# Patient Record
Sex: Male | Born: 1935 | Race: Black or African American | Hispanic: No | Smoking: Never smoker
Health system: Southern US, Community
[De-identification: ages and names within clinical notes are randomized; demographics above are authoritative.]

## PROBLEM LIST (undated history)

## (undated) DIAGNOSIS — C801 Malignant (primary) neoplasm, unspecified: Secondary | ICD-10-CM

## (undated) DIAGNOSIS — G20A1 Parkinson's disease without dyskinesia, without mention of fluctuations: Secondary | ICD-10-CM

## (undated) DIAGNOSIS — T8859XA Other complications of anesthesia, initial encounter: Secondary | ICD-10-CM

## (undated) DIAGNOSIS — I10 Essential (primary) hypertension: Secondary | ICD-10-CM

## (undated) DIAGNOSIS — E785 Hyperlipidemia, unspecified: Secondary | ICD-10-CM

## (undated) DIAGNOSIS — F0390 Unspecified dementia without behavioral disturbance: Secondary | ICD-10-CM

## (undated) DIAGNOSIS — T4145XA Adverse effect of unspecified anesthetic, initial encounter: Secondary | ICD-10-CM

## (undated) DIAGNOSIS — M1990 Unspecified osteoarthritis, unspecified site: Secondary | ICD-10-CM

## (undated) HISTORY — PX: EYE SURGERY: SHX253

## (undated) HISTORY — PX: JOINT REPLACEMENT: SHX530

## (undated) SURGERY — REPAIR, HERNIA, INGUINAL, LAPAROSCOPIC
Anesthesia: General | Laterality: Left

---

## 1990-05-12 HISTORY — PX: OTHER SURGICAL HISTORY: SHX169

## 1990-05-12 HISTORY — PX: HERNIA REPAIR: SHX51

## 2006-09-29 ENCOUNTER — Ambulatory Visit: Payer: Self-pay | Admitting: Internal Medicine

## 2009-02-28 ENCOUNTER — Ambulatory Visit: Payer: Self-pay | Admitting: Ophthalmology

## 2009-08-01 ENCOUNTER — Telehealth: Payer: Self-pay | Admitting: Cardiovascular Disease

## 2009-10-09 ENCOUNTER — Encounter: Admission: RE | Admit: 2009-10-09 | Discharge: 2009-10-09 | Payer: Self-pay | Admitting: Orthopedic Surgery

## 2010-06-11 NOTE — Progress Notes (Signed)
Summary: Refill  Phone Note From Pharmacy   Caller: kmart Call For: Dr. Mariah Milling  Summary of Call: Pharmacy called and patient needs a refill on his Zocor, former [facility] patient. [facility] Initial call taken by: [place],  August 01, 2009 4:07 PM    New/Updated Medications: SIMVASTATIN 20 MG TABS (SIMVASTATIN) Take one tablet by mouth daily at bedtime

## 2011-06-10 ENCOUNTER — Ambulatory Visit: Payer: Self-pay | Admitting: General Surgery

## 2011-08-26 ENCOUNTER — Ambulatory Visit: Payer: Self-pay | Admitting: Anesthesiology

## 2011-09-12 ENCOUNTER — Ambulatory Visit: Payer: Self-pay | Admitting: Anesthesiology

## 2011-10-15 ENCOUNTER — Ambulatory Visit: Payer: Self-pay | Admitting: Anesthesiology

## 2014-01-01 DIAGNOSIS — N401 Enlarged prostate with lower urinary tract symptoms: Secondary | ICD-10-CM

## 2014-01-01 DIAGNOSIS — N138 Other obstructive and reflux uropathy: Secondary | ICD-10-CM | POA: Insufficient documentation

## 2014-01-01 DIAGNOSIS — M1990 Unspecified osteoarthritis, unspecified site: Secondary | ICD-10-CM | POA: Insufficient documentation

## 2014-01-01 DIAGNOSIS — I10 Essential (primary) hypertension: Secondary | ICD-10-CM | POA: Insufficient documentation

## 2014-01-01 DIAGNOSIS — M179 Osteoarthritis of knee, unspecified: Secondary | ICD-10-CM | POA: Insufficient documentation

## 2014-01-01 DIAGNOSIS — E7800 Pure hypercholesterolemia, unspecified: Secondary | ICD-10-CM | POA: Insufficient documentation

## 2015-01-23 DIAGNOSIS — Z0000 Encounter for general adult medical examination without abnormal findings: Secondary | ICD-10-CM | POA: Insufficient documentation

## 2015-10-16 DIAGNOSIS — R262 Difficulty in walking, not elsewhere classified: Secondary | ICD-10-CM | POA: Insufficient documentation

## 2015-10-16 DIAGNOSIS — M25562 Pain in left knee: Secondary | ICD-10-CM | POA: Insufficient documentation

## 2015-10-16 DIAGNOSIS — G3184 Mild cognitive impairment, so stated: Secondary | ICD-10-CM | POA: Insufficient documentation

## 2015-10-23 DIAGNOSIS — E538 Deficiency of other specified B group vitamins: Secondary | ICD-10-CM | POA: Insufficient documentation

## 2015-12-10 ENCOUNTER — Telehealth: Payer: Self-pay | Admitting: General Surgery

## 2015-12-10 NOTE — Telephone Encounter (Signed)
Left voice message for patient to schedule appointment for inguinal hernia. Referred by [facility]

## 2015-12-19 ENCOUNTER — Ambulatory Visit (INDEPENDENT_AMBULATORY_CARE_PROVIDER_SITE_OTHER): Payer: Medicare Other | Admitting: Surgery

## 2015-12-19 ENCOUNTER — Encounter: Payer: Self-pay | Admitting: Surgery

## 2015-12-19 VITALS — BP 129/73 | HR 50 | Temp 98.2°F | Ht 71.0 in | Wt 196.5 lb

## 2015-12-19 DIAGNOSIS — K4090 Unilateral inguinal hernia, without obstruction or gangrene, not specified as recurrent: Secondary | ICD-10-CM | POA: Diagnosis not present

## 2015-12-19 NOTE — Patient Instructions (Signed)
Please see Blue pre-care sheet for surgery information. Please call our office if you have questions or concerns.

## 2015-12-20 ENCOUNTER — Telehealth: Payer: Self-pay | Admitting: Surgery

## 2015-12-20 NOTE — Telephone Encounter (Signed)
Called patient to go over the surgery information below. No answer. I have left a message for patient to call back.   Pt advised of pre op date/time and sx date. Sx: 01/03/16 with Dr Pabon--Laparoscopic inguinal hernia repair with Mesh.  Pre op: 12/25/15 @ 9:00am--Office.   Patient made aware to call [phone], between 1-3:00pm the day before surgery, to find out what time to arrive.

## 2015-12-20 NOTE — Progress Notes (Signed)
Patient ID: Travis [name], male   DOB: 1931/12/22, 82 y.o.   MRN: [id number]  HPI Travis [name] is a 82 y.o. male seen in consultation for a left inguinal or hernia. Refer by Dr. Ouida Sills. He states that he has had a left inguinal hernia or several years and but over the last 6 months ago so this is starting to have intermittent left dull pain that is moderate and worsening with Valsalva maneuvers. The pain is non-referred and its alleviated time passes. He did have a history of a previous open inguinal hernia repair on the right side and he developed pain for about 2 years and that is why he was hesitant about having the left hernia site done. Now as I stated above his symptoms have worsened significantly. He is independent and he is able to walk without any shortness of breath or chest pain does not have any cardiac history that is significant. He is competent and his mind is very clear  HPI  History reviewed. No pertinent past medical history.  Past Surgical History:  Procedure Laterality Date  . eye implants  1992  . HERNIA REPAIR  1992    Family History  Problem Relation Age of Onset  . Stroke Father   . Diabetes Sister   . Breast cancer Sister     Social History Social History  Substance Use Topics  . Smoking status: Never Smoker  . Smokeless tobacco: Never Used  . Alcohol use No    No Known Allergies  Current Outpatient Prescriptions  Medication Sig Dispense Refill  . amLODipine (NORVASC) 5 MG tablet Take by mouth.    . pravastatin (PRAVACHOL) 40 MG tablet Take by mouth.    . sildenafil (REVATIO) 20 MG tablet Take by mouth.    Marland Kitchen aspirin EC 81 MG tablet Take by mouth.    . Cyanocobalamin (RA VITAMIN B-12 TR) 1000 MCG TBCR Take by mouth.     No current facility-administered medications for this visit.      Review of Systems A 10 point review of systems was asked and was negative except for the information on the HPI  Physical Exam Blood pressure 129/73, pulse  (!) 50, temperature 98.2 F (36.8 C), temperature source Oral, height 5\' 11"  (1.803 m), weight 89.1 kg (196 lb 8 oz). CONSTITUTIONAL: NAD , well developed EYES: Pupils are equal, round, and reactive to light, Sclera are non-icteric. EARS, NOSE, MOUTH AND THROAT: The oropharynx is clear. The oral mucosa is pink and moist. Hearing is intact to voice. LYMPH NODES:  Lymph nodes in the neck are normal. RESPIRATORY:  Lungs are clear. There is normal respiratory effort, with equal breath sounds bilaterally, and without pathologic use of accessory muscles. CARDIOVASCULAR: Heart is regular without murmurs, gallops, or rubs. GI: The abdomen is soft, nontender, and nondistended. There are no palpable masses. There is no hepatosplenomegaly. There are normal bowel sounds in all quadrants. There is a reducible Left inguinal hernia mildly tender to palpation GU: No evidence of testicular or penile lesions..   MUSCULOSKELETAL: Normal muscle strength and tone. No cyanosis or edema.   SKIN: Turgor is good and there are no pathologic skin lesions or ulcers. NEUROLOGIC: Motor and sensation is grossly normal. Cranial nerves are grossly intact. PSYCH:  Oriented to person, place and time. Affect is normal.  Data Reviewed CT and labs have personally been reviewed  and medical records.    Assessment/ Plan Symptomatic left inguinal hernia on a very functional octogenarian. Discussed  with him and the knees in detail about his disease process. He has significant symptoms that married repair of the inguinal hernia.I have explained the procedure, risks, and aftercare of inguinal hernia repair to [name].   Risks include but are not limited to bleeding, infection, wound problems, anesthesia, recurrence, bladder or intestine injury, urinary retention, testicular dysfunction, chronic pain, mesh problems.  He  seems to understand and agrees to proceed.  Questions were answered to his stated satisfaction. We'll plan for  a left laparoscopic inguinal hernia repair with mesh. We'll also arrange for preoperative visit with anesthesia.   Caroleen Hamman, MD FACS General Surgeon 12/20/2015, 9:41 AM

## 2015-12-21 NOTE — Telephone Encounter (Signed)
Patient was advised of surgery and Pre-Op date/time. Patient verbalized understanding and said he will call back if he has any further questions.

## 2015-12-25 ENCOUNTER — Encounter
Admission: RE | Admit: 2015-12-25 | Discharge: 2015-12-25 | Disposition: A | Discharge status: Home / Self Care | Payer: Medicare Other | Source: Ambulatory Visit | Attending: Surgery | Admitting: Surgery

## 2015-12-25 DIAGNOSIS — Z01810 Encounter for preprocedural cardiovascular examination: Secondary | ICD-10-CM | POA: Insufficient documentation

## 2015-12-25 DIAGNOSIS — Z01812 Encounter for preprocedural laboratory examination: Secondary | ICD-10-CM | POA: Insufficient documentation

## 2015-12-25 HISTORY — DX: Essential (primary) hypertension: I10

## 2015-12-25 LAB — CBC
HCT: 41.3 % (ref 40.0–52.0)
Hemoglobin: 13.4 g/dL (ref 13.0–18.0)
MCH: 24.4 pg — ABNORMAL LOW (ref 26.0–34.0)
MCHC: 32.4 g/dL (ref 32.0–36.0)
MCV: 75.4 fL — ABNORMAL LOW (ref 80.0–100.0)
Platelets: 175 10*3/uL (ref 150–440)
RBC: 5.48 MIL/uL (ref 4.40–5.90)
RDW: 17 % — ABNORMAL HIGH (ref 11.5–14.5)
WBC: 4.3 10*3/uL (ref 3.8–10.6)

## 2015-12-25 NOTE — Patient Instructions (Signed)
  Your procedure is scheduled PA:[id number] 24, 2017 (Thursday) Report to [facility] Day Surgery 2nd floor [facility] To find out your arrival time please call [phone] between 1PM - 3PM on January 02, 2016 (Wednesday)  Remember: Instructions that are not followed completely may result in serious medical risk, up to and including death, or upon the discretion of your surgeon and anesthesiologist your surgery may need to be rescheduled.    _x___ 1. Do not eat food or drink liquids after midnight. No gum chewing or hard candies.     _x___ 2. No Alcohol for 24 hours before or after surgery.   _x___3. No Smoking for 24 prior to surgery.   ____  4. Bring all medications with you on the day of surgery if instructed.    __x__ 5. Notify your doctor if there is any change in your medical condition     (cold, fever, infections).     Do not wear jewelry, make-up, hairpins, clips or nail polish.  Do not wear lotions, powders, or perfumes. You may wear deodorant.  Do not shave 48 hours prior to surgery. Men may shave face and neck.  Do not bring valuables to the hospital.    [facility] is not responsible for any belongings or valuables.               Contacts, dentures or bridgework may not be worn into surgery.  Leave your suitcase in the car. After surgery it may be brought to your room.  For patients admitted to the hospital, discharge time is determined by your treatment team.   Patients discharged the day of surgery will not be allowed to drive home.    Please read over the following fact sheets that you were given:   [facility] Preparing for Surgery and or MRSA Information   ___ Take these medicines the morning of surgery with A SIP OF WATER:    1.   2.  3.  4.  5.  6.  ____ Fleet Enema (as directed)   _x___ Use CHG Soap or sage wipes as directed on instruction sheet   ____ Use inhalers on the day of surgery and bring to hospital day of surgery  ____ Stop metformin 2 days  prior to surgery    ____ Take 1/2 of usual insulin dose the night before surgery and none on the morning of  surgery         __x__ Stop aspirin or coumadin, or plavix (Stop Aspirin one week prior to surgery)  _x__ Stop Anti-inflammatories such as Advil, Aleve, Ibuprofen, Motrin, Naproxen,          Naprosyn, Goodies powders or aspirin products. Ok to take Tylenol.   _x___ Stop supplements until after surgery.  (Stop Vitamin B-12 now)  ____ Bring C-Pap to the hospital.

## 2016-01-03 ENCOUNTER — Encounter: Payer: Self-pay | Admitting: *Deleted

## 2016-01-03 ENCOUNTER — Ambulatory Visit: Payer: Medicare Other | Admitting: Anesthesiology

## 2016-01-03 ENCOUNTER — Ambulatory Visit
Admission: RE | Admit: 2016-01-03 | Discharge: 2016-01-03 | Disposition: A | Discharge status: Home / Self Care | Payer: Medicare Other | Source: Ambulatory Visit | Attending: Surgery | Admitting: Surgery

## 2016-01-03 ENCOUNTER — Encounter
Admission: RE | Disposition: A | Discharge status: Home / Self Care | Payer: Self-pay | Source: Ambulatory Visit | Attending: Surgery

## 2016-01-03 DIAGNOSIS — Z7982 Long term (current) use of aspirin: Secondary | ICD-10-CM | POA: Insufficient documentation

## 2016-01-03 DIAGNOSIS — I10 Essential (primary) hypertension: Secondary | ICD-10-CM | POA: Diagnosis not present

## 2016-01-03 DIAGNOSIS — Z9889 Other specified postprocedural states: Secondary | ICD-10-CM | POA: Diagnosis not present

## 2016-01-03 DIAGNOSIS — Z79899 Other long term (current) drug therapy: Secondary | ICD-10-CM | POA: Insufficient documentation

## 2016-01-03 DIAGNOSIS — K4090 Unilateral inguinal hernia, without obstruction or gangrene, not specified as recurrent: Secondary | ICD-10-CM | POA: Diagnosis not present

## 2016-01-03 DIAGNOSIS — Z803 Family history of malignant neoplasm of breast: Secondary | ICD-10-CM | POA: Insufficient documentation

## 2016-01-03 DIAGNOSIS — Z833 Family history of diabetes mellitus: Secondary | ICD-10-CM | POA: Insufficient documentation

## 2016-01-03 DIAGNOSIS — Z823 Family history of stroke: Secondary | ICD-10-CM | POA: Insufficient documentation

## 2016-01-03 DIAGNOSIS — K469 Unspecified abdominal hernia without obstruction or gangrene: Secondary | ICD-10-CM

## 2016-01-03 HISTORY — PX: INGUINAL HERNIA REPAIR: SHX194

## 2016-01-03 SURGERY — REPAIR, HERNIA, INGUINAL, LAPAROSCOPIC
Anesthesia: General | Laterality: Left | Wound class: Clean

## 2016-01-03 MED ORDER — CEFAZOLIN SODIUM-DEXTROSE 2-4 GM/100ML-% IV SOLN
INTRAVENOUS | Status: AC
  Administered 2016-01-03: 2 g via INTRAVENOUS
  Filled 2016-01-03: qty 100

## 2016-01-03 MED ORDER — HYDROCODONE-ACETAMINOPHEN 5-325 MG PO TABS
ORAL_TABLET | ORAL | Status: AC
  Filled 2016-01-03: qty 1

## 2016-01-03 MED ORDER — CEFAZOLIN SODIUM-DEXTROSE 2-4 GM/100ML-% IV SOLN
2.0000 g | Freq: Once | INTRAVENOUS | Status: AC
  Administered 2016-01-03: 2 g via INTRAVENOUS

## 2016-01-03 MED ORDER — PHENYLEPHRINE HCL 10 MG/ML IJ SOLN
INTRAMUSCULAR | Status: DC | PRN
  Administered 2016-01-03: 100 ug via INTRAVENOUS

## 2016-01-03 MED ORDER — LIDOCAINE 2% (20 MG/ML) 5 ML SYRINGE
INTRAMUSCULAR | Status: DC | PRN
  Administered 2016-01-03: 100 mg via INTRAVENOUS

## 2016-01-03 MED ORDER — PROPOFOL 10 MG/ML IV BOLUS
INTRAVENOUS | Status: DC | PRN
  Administered 2016-01-03: 120 mg via INTRAVENOUS

## 2016-01-03 MED ORDER — DEXAMETHASONE SODIUM PHOSPHATE 10 MG/ML IJ SOLN
INTRAMUSCULAR | Status: DC | PRN
  Administered 2016-01-03: 10 mg via INTRAVENOUS

## 2016-01-03 MED ORDER — BUPIVACAINE-EPINEPHRINE (PF) 0.25% -1:200000 IJ SOLN
INTRAMUSCULAR | Status: DC | PRN
  Administered 2016-01-03: 30 mL via PERINEURAL

## 2016-01-03 MED ORDER — ONDANSETRON HCL 4 MG/2ML IJ SOLN
INTRAMUSCULAR | Status: DC | PRN
  Administered 2016-01-03: 4 mg via INTRAVENOUS

## 2016-01-03 MED ORDER — FAMOTIDINE 20 MG PO TABS
20.0000 mg | ORAL_TABLET | Freq: Once | ORAL | Status: AC
  Administered 2016-01-03: 20 mg via ORAL

## 2016-01-03 MED ORDER — SUGAMMADEX SODIUM 200 MG/2ML IV SOLN
INTRAVENOUS | Status: DC | PRN
  Administered 2016-01-03: 162.4 mg via INTRAVENOUS

## 2016-01-03 MED ORDER — FENTANYL CITRATE (PF) 100 MCG/2ML IJ SOLN
INTRAMUSCULAR | Status: AC
  Administered 2016-01-03: 25 ug via INTRAVENOUS
  Filled 2016-01-03: qty 2

## 2016-01-03 MED ORDER — ACETAMINOPHEN 10 MG/ML IV SOLN
INTRAVENOUS | Status: AC
  Filled 2016-01-03: qty 100

## 2016-01-03 MED ORDER — BUPIVACAINE-EPINEPHRINE (PF) 0.25% -1:200000 IJ SOLN
INTRAMUSCULAR | Status: AC
  Filled 2016-01-03: qty 30

## 2016-01-03 MED ORDER — FENTANYL CITRATE (PF) 100 MCG/2ML IJ SOLN
INTRAMUSCULAR | Status: DC | PRN
  Administered 2016-01-03: 100 ug via INTRAVENOUS
  Administered 2016-01-03: 50 ug via INTRAVENOUS
  Administered 2016-01-03: 100 ug via INTRAVENOUS
  Administered 2016-01-03: 50 ug via INTRAVENOUS

## 2016-01-03 MED ORDER — LABETALOL HCL 5 MG/ML IV SOLN
INTRAVENOUS | Status: DC | PRN
  Administered 2016-01-03 (×2): 10 mg via INTRAVENOUS

## 2016-01-03 MED ORDER — FAMOTIDINE 20 MG PO TABS
ORAL_TABLET | ORAL | Status: AC
  Administered 2016-01-03: 20 mg via ORAL
  Filled 2016-01-03: qty 1

## 2016-01-03 MED ORDER — HYDROCODONE-ACETAMINOPHEN 5-325 MG PO TABS: 1.0000 | ORAL_TABLET | Freq: Four times a day (QID) | ORAL | 0 refills | Status: DC | PRN

## 2016-01-03 MED ORDER — SUCCINYLCHOLINE CHLORIDE 20 MG/ML IJ SOLN
INTRAMUSCULAR | Status: DC | PRN
  Administered 2016-01-03: 100 mg via INTRAVENOUS

## 2016-01-03 MED ORDER — LACTATED RINGERS IV SOLN
INTRAVENOUS | Status: DC
  Administered 2016-01-03 (×2): via INTRAVENOUS

## 2016-01-03 MED ORDER — ONDANSETRON HCL 4 MG/2ML IJ SOLN: 4.0000 mg | Freq: Once | INTRAMUSCULAR | Status: DC | PRN

## 2016-01-03 MED ORDER — FENTANYL CITRATE (PF) 100 MCG/2ML IJ SOLN
25.0000 ug | INTRAMUSCULAR | Status: DC | PRN
  Administered 2016-01-03 (×4): 25 ug via INTRAVENOUS

## 2016-01-03 MED ORDER — ROCURONIUM BROMIDE 100 MG/10ML IV SOLN
INTRAVENOUS | Status: DC | PRN
  Administered 2016-01-03: 20 mg via INTRAVENOUS
  Administered 2016-01-03 (×2): 10 mg via INTRAVENOUS
  Administered 2016-01-03: 20 mg via INTRAVENOUS

## 2016-01-03 MED ORDER — HYDROCODONE-ACETAMINOPHEN 5-325 MG PO TABS
1.0000 | ORAL_TABLET | Freq: Four times a day (QID) | ORAL | Status: DC | PRN
  Administered 2016-01-03: 1 via ORAL

## 2016-01-03 MED ORDER — EPHEDRINE SULFATE 50 MG/ML IJ SOLN
INTRAMUSCULAR | Status: DC | PRN
  Administered 2016-01-03 (×2): 10 mg via INTRAVENOUS

## 2016-01-03 MED ORDER — ACETAMINOPHEN 10 MG/ML IV SOLN
INTRAVENOUS | Status: DC | PRN
  Administered 2016-01-03: 1000 mg via INTRAVENOUS

## 2016-01-03 MED ORDER — KETOROLAC TROMETHAMINE 30 MG/ML IJ SOLN
INTRAMUSCULAR | Status: DC | PRN
  Administered 2016-01-03: 15 mg via INTRAVENOUS

## 2016-01-03 MED ORDER — HYDRALAZINE HCL 20 MG/ML IJ SOLN
INTRAMUSCULAR | Status: DC | PRN
  Administered 2016-01-03: 10 mg via INTRAVENOUS

## 2016-01-03 SURGICAL SUPPLY — 38 items
APPLICATOR COTTON TIP 6IN STRL (MISCELLANEOUS) IMPLANT
CANISTER SUCT 1200ML W/VALVE (MISCELLANEOUS) ×2 IMPLANT
CHLORAPREP W/TINT 26ML (MISCELLANEOUS) ×2 IMPLANT
DEFOGGER SCOPE WARMER CLEARIFY (MISCELLANEOUS) ×2 IMPLANT
DEVICE SECURE STRAP 25 ABSORB (INSTRUMENTS) ×2 IMPLANT
DISSECT BALLN SPACEMKR OVL PDB (BALLOONS) ×2
DISSECTOR BALLN SPCMKR OVL PDB (BALLOONS) ×1 IMPLANT
DISSECTOR KITTNER STICK (MISCELLANEOUS) ×2 IMPLANT
DISSECTORS/KITTNER STICK (MISCELLANEOUS) ×4
DRAPE INCISE IOBAN 66X45 STRL (DRAPES) ×2 IMPLANT
ELECT REM PT RETURN 9FT ADLT (ELECTROSURGICAL) ×2
ELECTRODE REM PT RTRN 9FT ADLT (ELECTROSURGICAL) ×1 IMPLANT
GLOVE BIO SURGEON STRL SZ7 (GLOVE) ×10 IMPLANT
GLOVE EXAM NITRILE PF MED BLUE (GLOVE) ×2 IMPLANT
GOWN STRL REUS W/ TWL LRG LVL3 (GOWN DISPOSABLE) ×2 IMPLANT
GOWN STRL REUS W/TWL LRG LVL3 (GOWN DISPOSABLE) ×2
IRRIGATION STRYKERFLOW (MISCELLANEOUS) ×1 IMPLANT
IRRIGATOR STRYKERFLOW (MISCELLANEOUS) ×2
IV NS 1000ML (IV SOLUTION) ×1
IV NS 1000ML BAXH (IV SOLUTION) ×1 IMPLANT
L-HOOK LAP DISP 36CM (ELECTROSURGICAL)
LHOOK LAP DISP 36CM (ELECTROSURGICAL) IMPLANT
LIQUID BAND (GAUZE/BANDAGES/DRESSINGS) ×2 IMPLANT
MESH 3DMAX 3X5 LT MED (Mesh General) ×2 IMPLANT
NDL INSUFF ACCESS 14 VERSASTEP (NEEDLE) ×2 IMPLANT
NEEDLE HYPO 22GX1.5 SAFETY (NEEDLE) ×2 IMPLANT
NS IRRIG 500ML POUR BTL (IV SOLUTION) ×2 IMPLANT
PACK LAP CHOLECYSTECTOMY (MISCELLANEOUS) ×2 IMPLANT
PENCIL ELECTRO HAND CTR (MISCELLANEOUS) ×2 IMPLANT
SCISSORS METZENBAUM CVD 33 (INSTRUMENTS) ×2 IMPLANT
SLEEVE ENDOPATH XCEL 5M (ENDOMECHANICALS) ×2 IMPLANT
SURGILUBE 2OZ TUBE FLIPTOP (MISCELLANEOUS) ×2 IMPLANT
SUT MNCRL AB 4-0 PS2 18 (SUTURE) ×2 IMPLANT
SUT VICRYL 0 AB UR-6 (SUTURE) ×2 IMPLANT
TRAY FOLEY CATH SILVER 16FR LF (SET/KITS/TRAYS/PACK) IMPLANT
TROCAR BALLN 10M OMST10SB SPAC (TROCAR) IMPLANT
TROCAR XCEL NON-BLD 5MMX100MML (ENDOMECHANICALS) ×2 IMPLANT
TUBING INSUFFLATOR HI FLOW (MISCELLANEOUS) ×2 IMPLANT

## 2016-01-03 NOTE — Anesthesia Procedure Notes (Signed)
Procedure Name: Intubation Date/Time: 01/03/2016 9:10 AM Performed by: Marsh Dolly Pre-anesthesia Checklist: Patient identified, Patient being monitored, Timeout performed, Emergency Drugs available and Suction available Patient Re-evaluated:Patient Re-evaluated prior to inductionOxygen Delivery Method: Circle system utilized Preoxygenation: Pre-oxygenation with 100% oxygen Intubation Type: IV induction Ventilation: Mask ventilation without difficulty Laryngoscope Size: 3 and Miller Grade View: Grade I Tube type: Oral Tube size: 7.5 mm Number of attempts: 1 Airway Equipment and Method: Stylet Placement Confirmation: ETT inserted through vocal cords under direct vision,  positive ETCO2 and breath sounds checked- equal and bilateral Secured at: 21 cm Tube secured with: Tape Dental Injury: Teeth and Oropharynx as per pre-operative assessment

## 2016-01-03 NOTE — Transfer of Care (Signed)
Immediate Anesthesia Transfer of Care Note  Patient: Travis [name]  Procedure(s) Performed: Procedure(s): LAPAROSCOPIC INGUINAL HERNIA (Left)  Patient Location: PACU  Anesthesia Type:General  Level of Consciousness: awake, alert  and oriented  Airway & Oxygen Therapy: Patient Spontanous Breathing and Patient connected to face mask oxygen  Post-op Assessment: Report given to RN and Post -op Vital signs reviewed and stable  Post vital signs: Reviewed and stable  Last Vitals:  Vitals:   01/03/16 0819  BP: 128/71  Pulse: (!) 51  Resp: 18  Temp: 36.3 C    Last Pain:  Vitals:   01/03/16 0819  TempSrc: Tympanic  PainSc: 5          Complications: No apparent anesthesia complications

## 2016-01-03 NOTE — Discharge Instructions (Addendum)

## 2016-01-03 NOTE — Op Note (Signed)
Laparoscopic Left Inguinal Hernia Repair  JERIEL HARDWELL  01/03/2016  Pre-operative Diagnosis: Left Inguinal Hernia  Post-operative Diagnosis: Same  Procedure: Laparoscopic preperitoneal repair of Left inguinal hernia w 3D MESH BARD  Surgeon: Caroleen Hamman, MD FACS  Anesthesia: Gen. with endotracheal tube  Findings: Direct Inguinal hernia  Procedure Details  The patient was seen again in the Holding Room. The benefits, complications, treatment options, and expected outcomes were discussed with the patient. The risks of bleeding, infection, recurrence of symptoms, failure to resolve symptoms, recurrence of hernia, ischemic orchitis, chronic pain syndrome or neuroma, were discussed again. The likelihood of improving the patient's symptoms with return to their baseline status is good.  The patient and/or family concurred with the proposed plan, giving informed consent.  The patient was taken to Operating Room, identified as BLEU REICHEL and the procedure verified as Laparoscopic Inguinal Hernia Repair. Laterality confirmed.  A Time Out was held and the above information confirmed.  Prior to the induction of general anesthesia, antibiotic prophylaxis was administered. VTE prophylaxis was in place. General endotracheal anesthesia was then administered and tolerated well. After the induction, the abdomen was prepped with Chloraprep and draped in the sterile fashion. The patient was positioned in the supine position.  Local anesthetic  was injected into the skin near the umbilicus and an incision made. An incision was made and dissection down to the rectus fascia was performed. The fascia was incised and the muscle retracted laterally. The Covidien dissecting balloon was placed followed by the structural balloon. The preperitoneal space was insufflated and under direct vision 2 midline 5 mm ports were placed.  Dissection was performed to delineate Cooper's ligament and the lateral extent of  dissection was determined . The nerve on the lateral abdominal wall was identified and kept in view at all times. The cord was skeletonized , direct defect visualized.  Once this was complete, a medium 3D Mesh mesh was placed into the preperitoneal space covering the direct, indirect and femoral spaces. The mesh was held in place with the absorbable tacking device avoiding the area of the nerve. Once assuring that the hernia was completely covered, the preperitoneal space was desufflated under direct vision. There was no sign of peritoneal rent and no sign of bowel intrusion towards the mesh.  Once assuring that hemostasis was adequate the ports were removed and a figure-of-eight 0 Vicryl suture was placed at the fascial edges. 4-0 subcuticular Monocryl was used at all skin edges. Dermabond used for the icisions.  Patient tolerated the procedure well. There were no complications. He was taken to the recovery room in stable condition.               Caroleen Hamman, MD, FACS

## 2016-01-03 NOTE — Interval H&P Note (Signed)
History and Physical Interval Note:  01/03/2016 8:50 AM  Travis [name]  has presented today for surgery, with the diagnosis of inguinal hernia  The various methods of treatment have been discussed with the patient and family. After consideration of risks, benefits and other options for treatment, the patient has consented to  Procedure(s): LAPAROSCOPIC INGUINAL HERNIA (Left) as a surgical intervention .  The patient's history has been reviewed, patient examined, no change in status, stable for surgery.  I have reviewed the patient's chart and labs.  Questions were answered to the patient's satisfaction.     [place]

## 2016-01-03 NOTE — H&P (View-Only) (Signed)
Patient ID: Travis [name], male   DOB: 1931/12/22, 82 y.o.   MRN: [id number]  HPI Travis [name] is a 82 y.o. male seen in consultation for a left inguinal or hernia. Refer by Dr. Ouida Sills. He states that he has had a left inguinal hernia or several years and but over the last 6 months ago so this is starting to have intermittent left dull pain that is moderate and worsening with Valsalva maneuvers. The pain is non-referred and its alleviated time passes. He did have a history of a previous open inguinal hernia repair on the right side and he developed pain for about 2 years and that is why he was hesitant about having the left hernia site done. Now as I stated above his symptoms have worsened significantly. He is independent and he is able to walk without any shortness of breath or chest pain does not have any cardiac history that is significant. He is competent and his mind is very clear  HPI  History reviewed. No pertinent past medical history.  Past Surgical History:  Procedure Laterality Date  . eye implants  1992  . HERNIA REPAIR  1992    Family History  Problem Relation Age of Onset  . Stroke Father   . Diabetes Sister   . Breast cancer Sister     Social History Social History  Substance Use Topics  . Smoking status: Never Smoker  . Smokeless tobacco: Never Used  . Alcohol use No    No Known Allergies  Current Outpatient Prescriptions  Medication Sig Dispense Refill  . amLODipine (NORVASC) 5 MG tablet Take by mouth.    . pravastatin (PRAVACHOL) 40 MG tablet Take by mouth.    . sildenafil (REVATIO) 20 MG tablet Take by mouth.    Marland Kitchen aspirin EC 81 MG tablet Take by mouth.    . Cyanocobalamin (RA VITAMIN B-12 TR) 1000 MCG TBCR Take by mouth.     No current facility-administered medications for this visit.      Review of Systems A 10 point review of systems was asked and was negative except for the information on the HPI  Physical Exam Blood pressure 129/73, pulse  (!) 50, temperature 98.2 F (36.8 C), temperature source Oral, height 5\' 11"  (1.803 m), weight 89.1 kg (196 lb 8 oz). CONSTITUTIONAL: NAD , well developed EYES: Pupils are equal, round, and reactive to light, Sclera are non-icteric. EARS, NOSE, MOUTH AND THROAT: The oropharynx is clear. The oral mucosa is pink and moist. Hearing is intact to voice. LYMPH NODES:  Lymph nodes in the neck are normal. RESPIRATORY:  Lungs are clear. There is normal respiratory effort, with equal breath sounds bilaterally, and without pathologic use of accessory muscles. CARDIOVASCULAR: Heart is regular without murmurs, gallops, or rubs. GI: The abdomen is soft, nontender, and nondistended. There are no palpable masses. There is no hepatosplenomegaly. There are normal bowel sounds in all quadrants. There is a reducible Left inguinal hernia mildly tender to palpation GU: No evidence of testicular or penile lesions..   MUSCULOSKELETAL: Normal muscle strength and tone. No cyanosis or edema.   SKIN: Turgor is good and there are no pathologic skin lesions or ulcers. NEUROLOGIC: Motor and sensation is grossly normal. Cranial nerves are grossly intact. PSYCH:  Oriented to person, place and time. Affect is normal.  Data Reviewed CT and labs have personally been reviewed  and medical records.    Assessment/ Plan Symptomatic left inguinal hernia on a very functional octogenarian. Discussed  with him and the knees in detail about his disease process. He has significant symptoms that married repair of the inguinal hernia.I have explained the procedure, risks, and aftercare of inguinal hernia repair to Travis [name].   Risks include but are not limited to bleeding, infection, wound problems, anesthesia, recurrence, bladder or intestine injury, urinary retention, testicular dysfunction, chronic pain, mesh problems.  He  seems to understand and agrees to proceed.  Questions were answered to his stated satisfaction. We'll plan for  a left laparoscopic inguinal hernia repair with mesh. We'll also arrange for preoperative visit with anesthesia.   Caroleen Hamman, MD FACS General Surgeon 12/20/2015, 9:41 AM

## 2016-01-03 NOTE — Anesthesia Postprocedure Evaluation (Signed)
Anesthesia Post Note  Patient: JORREL CRIPE  Procedure(s) Performed: Procedure(s) (LRB): LAPAROSCOPIC INGUINAL HERNIA (Left)  Patient location during evaluation: PACU Anesthesia Type: General Level of consciousness: awake and alert and oriented Pain management: pain level controlled Vital Signs Assessment: post-procedure vital signs reviewed and stable Respiratory status: spontaneous breathing Cardiovascular status: blood pressure returned to baseline Anesthetic complications: no    Last Vitals:  Vitals:   01/03/16 1222 01/03/16 1407  BP: (!) 114/59 (!) 98/55  Pulse: 73 68  Resp: 16 16  Temp: 36.9 C     Last Pain:  Vitals:   01/03/16 1407  TempSrc:   PainSc: 2                  Jacoby Zanni

## 2016-01-03 NOTE — Anesthesia Preprocedure Evaluation (Signed)
Anesthesia Evaluation  Patient identified by MRN, date of birth, ID band Patient awake    Reviewed: Allergy & Precautions, NPO status , Patient's Chart, lab work & pertinent test results  Airway Mallampati: II  TM Distance: >3 FB     Dental no notable dental hx.    Pulmonary neg pulmonary ROS,    Pulmonary exam normal        Cardiovascular hypertension, Pt. on medications Normal cardiovascular exam     Neuro/Psych negative neurological ROS  negative psych ROS   GI/Hepatic Neg liver ROS,   Endo/Other  negative endocrine ROS  Renal/GU negative Renal ROS  negative genitourinary   Musculoskeletal negative musculoskeletal ROS (+)   Abdominal Normal abdominal exam  (+)   Peds negative pediatric ROS (+)  Hematology negative hematology ROS (+)   Anesthesia Other Findings   Reproductive/Obstetrics                             Anesthesia Physical Anesthesia Plan  ASA: II  Anesthesia Plan: General   Post-op Pain Management:    Induction: Intravenous  Airway Management Planned: Oral ETT  Additional Equipment:   Intra-op Plan:   Post-operative Plan: Extubation in OR  Informed Consent: I have reviewed the patients History and Physical, chart, labs and discussed the procedure including the risks, benefits and alternatives for the proposed anesthesia with the patient or authorized representative who has indicated his/her understanding and acceptance.   Dental advisory given  Plan Discussed with: CRNA and Surgeon  Anesthesia Plan Comments:         Anesthesia Quick Evaluation

## 2016-01-04 ENCOUNTER — Encounter: Payer: Self-pay | Admitting: Surgery

## 2016-01-10 ENCOUNTER — Ambulatory Visit (INDEPENDENT_AMBULATORY_CARE_PROVIDER_SITE_OTHER): Payer: Medicare Other | Admitting: Surgery

## 2016-01-10 ENCOUNTER — Encounter: Payer: Self-pay | Admitting: Surgery

## 2016-01-10 VITALS — BP 106/67 | HR 60 | Temp 98.7°F | Wt 196.0 lb

## 2016-01-10 DIAGNOSIS — Z09 Encounter for follow-up examination after completed treatment for conditions other than malignant neoplasm: Secondary | ICD-10-CM

## 2016-01-10 NOTE — Progress Notes (Signed)
S/p lap [facility]  8/24 Doing well Some soreness  PE NAD Abd: soft, Nt, incisions c/d/i, some appropiate incisional tenderness. No recurrence  A/P doing well from lap repair No heavy lifting RTC prn

## 2016-01-10 NOTE — Patient Instructions (Addendum)
Please call our office with any questions or concerns.  Please do not submerge in a tub, hot tub, or pool until incisions are completely sealed.  You may now resume your normal activities. Listen to your body when lifting, if you have pain when lifting, stop and then try again in a few days.  If you develop redness, drainage, or pain at incision sites- call our office immediately and speak with a nurse.  Please take 17G of Miralax twice a day until you are able to have a bowel movement. Then you are able to continue taking 17G of Miralax once a day to keep you regulated.

## 2016-01-29 ENCOUNTER — Other Ambulatory Visit: Payer: Self-pay | Admitting: Orthopedic Surgery

## 2016-01-29 DIAGNOSIS — M1712 Unilateral primary osteoarthritis, left knee: Secondary | ICD-10-CM

## 2016-02-05 ENCOUNTER — Ambulatory Visit
Admission: RE | Admit: 2016-02-05 | Discharge: 2016-02-05 | Disposition: A | Discharge status: Home / Self Care | Payer: Medicare Other | Source: Ambulatory Visit | Attending: Orthopedic Surgery | Admitting: Orthopedic Surgery

## 2016-02-05 DIAGNOSIS — M1712 Unilateral primary osteoarthritis, left knee: Secondary | ICD-10-CM | POA: Insufficient documentation

## 2016-03-05 ENCOUNTER — Encounter
Admission: RE | Admit: 2016-03-05 | Discharge: 2016-03-05 | Disposition: A | Discharge status: Home / Self Care | Payer: Medicare Other | Source: Ambulatory Visit | Attending: Orthopedic Surgery | Admitting: Orthopedic Surgery

## 2016-03-05 DIAGNOSIS — Z01812 Encounter for preprocedural laboratory examination: Secondary | ICD-10-CM | POA: Diagnosis not present

## 2016-03-05 DIAGNOSIS — M1712 Unilateral primary osteoarthritis, left knee: Secondary | ICD-10-CM | POA: Insufficient documentation

## 2016-03-05 HISTORY — DX: Hyperlipidemia, unspecified: E78.5

## 2016-03-05 HISTORY — DX: Unspecified osteoarthritis, unspecified site: M19.90

## 2016-03-05 LAB — SEDIMENTATION RATE: Sed Rate: 1 mm/hr (ref 0–20)

## 2016-03-05 LAB — TYPE AND SCREEN
ABO/RH(D): O POS
Antibody Screen: NEGATIVE

## 2016-03-05 LAB — PROTIME-INR
INR: 0.99
Prothrombin Time: 13.1 seconds (ref 11.4–15.2)

## 2016-03-05 LAB — CBC
HCT: 43.6 % (ref 40.0–52.0)
Hemoglobin: 14.1 g/dL (ref 13.0–18.0)
MCH: 24.5 pg — ABNORMAL LOW (ref 26.0–34.0)
MCHC: 32.3 g/dL (ref 32.0–36.0)
MCV: 75.8 fL — ABNORMAL LOW (ref 80.0–100.0)
Platelets: 201 10*3/uL (ref 150–440)
RBC: 5.75 MIL/uL (ref 4.40–5.90)
RDW: 16.8 % — ABNORMAL HIGH (ref 11.5–14.5)
WBC: 4.3 10*3/uL (ref 3.8–10.6)

## 2016-03-05 LAB — BASIC METABOLIC PANEL
Anion gap: 6 (ref 5–15)
BUN: 11 mg/dL (ref 6–20)
CO2: 30 mmol/L (ref 22–32)
Calcium: 9.7 mg/dL (ref 8.9–10.3)
Chloride: 103 mmol/L (ref 101–111)
Creatinine, Ser: 0.88 mg/dL (ref 0.61–1.24)
GFR calc Af Amer: >60 mL/min (ref >60)
GFR calc non Af Amer: >60 mL/min (ref >60)
Glucose, Bld: 77 mg/dL (ref 65–99)
Potassium: 3.7 mmol/L (ref 3.5–5.1)
Sodium: 139 mmol/L (ref 135–145)

## 2016-03-05 LAB — URINALYSIS COMPLETE WITH MICROSCOPIC (ARMC ONLY)
Bacteria, UA: NONE SEEN
Bilirubin Urine: NEGATIVE
Glucose, UA: NEGATIVE mg/dL
Ketones, ur: NEGATIVE mg/dL
Leukocytes, UA: NEGATIVE
Nitrite: NEGATIVE
Protein, ur: NEGATIVE mg/dL
Specific Gravity, Urine: 1.003 — ABNORMAL LOW (ref 1.005–1.030)
Squamous Epithelial / LPF: NONE SEEN
WBC, UA: NONE SEEN WBC/hpf (ref 0–5)
pH: 6 (ref 5.0–8.0)

## 2016-03-05 LAB — SURGICAL PCR SCREEN
MRSA, PCR: NEGATIVE
Staphylococcus aureus: NEGATIVE

## 2016-03-05 LAB — APTT: aPTT: 30 seconds (ref 24–36)

## 2016-03-05 NOTE — Patient Instructions (Signed)
  Your procedure is scheduled on March 18, 2016 (Tuesday)  Report to [facility] Day Surgery 2nd floor [facility] To find out your arrival time please call [phone] between 1PM - 3PM on March 17, 2016 (Monday)  Remember: Instructions that are not followed completely may result in serious medical risk, up to and including death, or upon the discretion of your surgeon and anesthesiologist your surgery may need to be rescheduled.    _x___ 1. Do not eat food or drink liquids after midnight. No gum chewing or hard candies.     __x__ 2. No Alcohol for 24 hours before or after surgery.   __x__3. No Smoking for 24 prior to surgery.   ____  4. Bring all medications with you on the day of surgery if instructed.    __x__ 5. Notify your doctor if there is any change in your medical condition     (cold, fever, infections).     Do not wear jewelry, make-up, hairpins, clips or nail polish.  Do not wear lotions, powders, or perfumes. You may wear deodorant.  Do not shave 48 hours prior to surgery. Men may shave face and neck.  Do not bring valuables to the hospital.    [facility] is not responsible for any belongings or valuables.               Contacts, dentures or bridgework may not be worn into surgery.  Leave your suitcase in the car. After surgery it may be brought to your room.  For patients admitted to the hospital, discharge time is determined by your treatment team.   Patients discharged the day of surgery will not be allowed to drive home.    Please read over the following fact sheets that you were given:   [facility] Preparing for Surgery and or MRSA Information   __ Take these medicines the morning of surgery with A SIP OF WATER:    1.   2.  3.  4.  5.  6.  ____Fleets enema or Magnesium Citrate as directed.   _x___ Use CHG Soap or sage wipes as directed on instruction sheet   ____ Use inhalers on the day of surgery and bring to hospital day of surgery  ____ Stop  metformin 2 days prior to surgery    ____ Take 1/2 of usual insulin dose the night before surgery and none on the morning of surgery.            __x__ Stop aspirin or coumadin, or plavix (Patient has stopped Aspirin)  x__ Stop Anti-inflammatories such as Advil, Aleve, Ibuprofen, Motrin, Naproxen,          Naprosyn, Goodies powders or aspirin products. Ok to take Tylenol.   _x___ Stop supplements until after surgery.  (Stop Vitamin B-12 now)  ____ Bring C-Pap to the hospital.

## 2016-03-07 LAB — URINE CULTURE

## 2016-03-18 ENCOUNTER — Inpatient Hospital Stay: Payer: Medicare Other | Admitting: Anesthesiology

## 2016-03-18 ENCOUNTER — Encounter
Admission: RE | Disposition: A | Discharge status: Skilled Nursing Facility | Payer: Self-pay | Source: Ambulatory Visit | Attending: Orthopedic Surgery

## 2016-03-18 ENCOUNTER — Inpatient Hospital Stay: Payer: Medicare Other

## 2016-03-18 ENCOUNTER — Inpatient Hospital Stay
Admission: RE | Admit: 2016-03-18 | Discharge: 2016-03-21 | DRG: 470 | Disposition: A | Discharge status: Skilled Nursing Facility | Payer: Medicare Other | Source: Ambulatory Visit | Attending: Orthopedic Surgery | Admitting: Orthopedic Surgery

## 2016-03-18 ENCOUNTER — Encounter: Payer: Self-pay | Admitting: *Deleted

## 2016-03-18 DIAGNOSIS — M6281 Muscle weakness (generalized): Secondary | ICD-10-CM

## 2016-03-18 DIAGNOSIS — D62 Acute posthemorrhagic anemia: Secondary | ICD-10-CM | POA: Diagnosis not present

## 2016-03-18 DIAGNOSIS — E785 Hyperlipidemia, unspecified: Secondary | ICD-10-CM | POA: Diagnosis present

## 2016-03-18 DIAGNOSIS — R262 Difficulty in walking, not elsewhere classified: Secondary | ICD-10-CM

## 2016-03-18 DIAGNOSIS — G8918 Other acute postprocedural pain: Secondary | ICD-10-CM

## 2016-03-18 DIAGNOSIS — M25562 Pain in left knee: Secondary | ICD-10-CM

## 2016-03-18 DIAGNOSIS — M1990 Unspecified osteoarthritis, unspecified site: Secondary | ICD-10-CM | POA: Diagnosis present

## 2016-03-18 DIAGNOSIS — I10 Essential (primary) hypertension: Secondary | ICD-10-CM | POA: Diagnosis present

## 2016-03-18 DIAGNOSIS — M1712 Unilateral primary osteoarthritis, left knee: Secondary | ICD-10-CM | POA: Diagnosis present

## 2016-03-18 DIAGNOSIS — G3184 Mild cognitive impairment, so stated: Secondary | ICD-10-CM | POA: Diagnosis present

## 2016-03-18 HISTORY — PX: TOTAL KNEE ARTHROPLASTY: SHX125

## 2016-03-18 LAB — CBC
HCT: 41.6 % (ref 40.0–52.0)
Hemoglobin: 13.4 g/dL (ref 13.0–18.0)
MCH: 24.5 pg — ABNORMAL LOW (ref 26.0–34.0)
MCHC: 32.3 g/dL (ref 32.0–36.0)
MCV: 75.7 fL — ABNORMAL LOW (ref 80.0–100.0)
Platelets: 179 10*3/uL (ref 150–440)
RBC: 5.49 MIL/uL (ref 4.40–5.90)
RDW: 17.2 % — ABNORMAL HIGH (ref 11.5–14.5)
WBC: 5.4 10*3/uL (ref 3.8–10.6)

## 2016-03-18 LAB — ABO/RH: ABO/RH(D): O POS

## 2016-03-18 LAB — CREATININE, SERUM
Creatinine, Ser: 1 mg/dL (ref 0.61–1.24)
GFR calc Af Amer: >60 mL/min (ref >60)
GFR calc non Af Amer: >60 mL/min (ref >60)

## 2016-03-18 SURGERY — ARTHROPLASTY, KNEE, TOTAL
Anesthesia: Spinal | Site: Knee | Laterality: Left | Wound class: Clean

## 2016-03-18 MED ORDER — SODIUM CHLORIDE 0.9 % IJ SOLN
INTRAMUSCULAR | Status: AC
  Filled 2016-03-18: qty 100

## 2016-03-18 MED ORDER — OXYCODONE HCL 5 MG PO TABS
5.0000 mg | ORAL_TABLET | ORAL | Status: DC | PRN
  Administered 2016-03-18: 5 mg via ORAL
  Administered 2016-03-18: 10 mg via ORAL
  Administered 2016-03-19: 5 mg via ORAL
  Administered 2016-03-19: 10 mg via ORAL
  Administered 2016-03-19 (×2): 5 mg via ORAL
  Administered 2016-03-19 (×2): 10 mg via ORAL
  Administered 2016-03-19: 5 mg via ORAL
  Administered 2016-03-20: 10 mg via ORAL
  Administered 2016-03-20: 5 mg via ORAL
  Filled 2016-03-18: qty 1
  Filled 2016-03-18: qty 2
  Filled 2016-03-18: qty 1
  Filled 2016-03-18: qty 2
  Filled 2016-03-18: qty 1
  Filled 2016-03-18: qty 2
  Filled 2016-03-18: qty 1
  Filled 2016-03-18: qty 2
  Filled 2016-03-18: qty 1
  Filled 2016-03-18: qty 2

## 2016-03-18 MED ORDER — FAMOTIDINE 20 MG PO TABS
ORAL_TABLET | ORAL | Status: AC
  Administered 2016-03-18: 20 mg via ORAL
  Filled 2016-03-18: qty 1

## 2016-03-18 MED ORDER — FAMOTIDINE 20 MG PO TABS
20.0000 mg | ORAL_TABLET | Freq: Once | ORAL | Status: AC
  Administered 2016-03-18: 20 mg via ORAL

## 2016-03-18 MED ORDER — TRANEXAMIC ACID 1000 MG/10ML IV SOLN
INTRAVENOUS | Status: AC
Start: 2016-03-18 — End: 2016-03-18
  Filled 2016-03-18: qty 10

## 2016-03-18 MED ORDER — BUPIVACAINE LIPOSOME 1.3 % IJ SUSP
INTRAMUSCULAR | Status: AC
  Filled 2016-03-18: qty 20

## 2016-03-18 MED ORDER — KETAMINE HCL 50 MG/ML IJ SOLN
INTRAMUSCULAR | Status: DC | PRN
  Administered 2016-03-18: 25 mg via INTRAMUSCULAR

## 2016-03-18 MED ORDER — BUPIVACAINE HCL (PF) 0.5 % IJ SOLN
INTRAMUSCULAR | Status: DC | PRN
  Administered 2016-03-18: 3 mL

## 2016-03-18 MED ORDER — NEOMYCIN-POLYMYXIN B GU 40-200000 IR SOLN
Status: AC
  Filled 2016-03-18: qty 20

## 2016-03-18 MED ORDER — METOCLOPRAMIDE HCL 5 MG/ML IJ SOLN: 5.0000 mg | Freq: Three times a day (TID) | INTRAMUSCULAR | Status: DC | PRN

## 2016-03-18 MED ORDER — CEFAZOLIN SODIUM-DEXTROSE 2-4 GM/100ML-% IV SOLN
2.0000 g | Freq: Once | INTRAVENOUS | Status: AC
  Administered 2016-03-18: 2 g via INTRAVENOUS

## 2016-03-18 MED ORDER — ENOXAPARIN SODIUM 30 MG/0.3ML ~~LOC~~ SOLN
30.0000 mg | Freq: Two times a day (BID) | SUBCUTANEOUS | Status: DC
  Administered 2016-03-19 – 2016-03-21 (×5): 30 mg via SUBCUTANEOUS
  Filled 2016-03-18 (×5): qty 0.3

## 2016-03-18 MED ORDER — METHOCARBAMOL 500 MG PO TABS
500.0000 mg | ORAL_TABLET | Freq: Four times a day (QID) | ORAL | Status: DC | PRN
  Administered 2016-03-18: 500 mg via ORAL
  Filled 2016-03-18: qty 1

## 2016-03-18 MED ORDER — BUPIVACAINE-EPINEPHRINE (PF) 0.25% -1:200000 IJ SOLN
INTRAMUSCULAR | Status: AC
  Filled 2016-03-18: qty 30

## 2016-03-18 MED ORDER — DEXTROSE 5 % IV SOLN
500.0000 mg | Freq: Four times a day (QID) | INTRAVENOUS | Status: DC | PRN
  Filled 2016-03-18: qty 5

## 2016-03-18 MED ORDER — NEOMYCIN-POLYMYXIN B GU 40-200000 IR SOLN
Status: DC | PRN
  Administered 2016-03-18: 14 mL

## 2016-03-18 MED ORDER — EPHEDRINE SULFATE 50 MG/ML IJ SOLN
INTRAMUSCULAR | Status: DC | PRN
  Administered 2016-03-18: 10 mg via INTRAVENOUS
  Administered 2016-03-18: 5 mg via INTRAVENOUS
  Administered 2016-03-18: 10 mg via INTRAVENOUS

## 2016-03-18 MED ORDER — ONDANSETRON HCL 4 MG/2ML IJ SOLN: 4.0000 mg | Freq: Once | INTRAMUSCULAR | Status: DC | PRN

## 2016-03-18 MED ORDER — ASPIRIN EC 81 MG PO TBEC
81.0000 mg | DELAYED_RELEASE_TABLET | Freq: Every day | ORAL | Status: DC
  Administered 2016-03-19 – 2016-03-21 (×3): 81 mg via ORAL
  Filled 2016-03-18 (×3): qty 1

## 2016-03-18 MED ORDER — CEFAZOLIN SODIUM-DEXTROSE 2-4 GM/100ML-% IV SOLN
INTRAVENOUS | Status: AC
  Filled 2016-03-18: qty 100

## 2016-03-18 MED ORDER — FENTANYL CITRATE (PF) 100 MCG/2ML IJ SOLN
INTRAMUSCULAR | Status: DC | PRN
  Administered 2016-03-18: 50 ug via INTRAVENOUS

## 2016-03-18 MED ORDER — HYDROMORPHONE HCL 1 MG/ML IJ SOLN
1.0000 mg | Freq: Once | INTRAMUSCULAR | Status: AC
  Administered 2016-03-18: 1 mg via INTRAVENOUS
  Filled 2016-03-18: qty 1

## 2016-03-18 MED ORDER — MIDAZOLAM HCL 5 MG/5ML IJ SOLN
INTRAMUSCULAR | Status: DC | PRN
  Administered 2016-03-18: 1 mg via INTRAVENOUS

## 2016-03-18 MED ORDER — MORPHINE SULFATE (PF) 2 MG/ML IV SOLN
2.0000 mg | INTRAVENOUS | Status: DC | PRN
  Administered 2016-03-18 – 2016-03-19 (×7): 2 mg via INTRAVENOUS
  Filled 2016-03-18 (×7): qty 1

## 2016-03-18 MED ORDER — BISACODYL 10 MG RE SUPP
10.0000 mg | Freq: Every day | RECTAL | Status: DC | PRN
Start: 2016-03-18 — End: 2016-03-21
  Administered 2016-03-20: 10 mg via RECTAL
  Filled 2016-03-18: qty 1

## 2016-03-18 MED ORDER — PRAVASTATIN SODIUM 40 MG PO TABS
40.0000 mg | ORAL_TABLET | Freq: Every evening | ORAL | Status: DC
  Administered 2016-03-19: 40 mg via ORAL
  Filled 2016-03-18: qty 1

## 2016-03-18 MED ORDER — ACETAMINOPHEN 325 MG PO TABS
650.0000 mg | ORAL_TABLET | Freq: Four times a day (QID) | ORAL | Status: DC | PRN
  Administered 2016-03-19: 650 mg via ORAL
  Filled 2016-03-18: qty 2

## 2016-03-18 MED ORDER — ONDANSETRON HCL 4 MG PO TABS: 4.0000 mg | ORAL_TABLET | Freq: Four times a day (QID) | ORAL | Status: DC | PRN

## 2016-03-18 MED ORDER — AMLODIPINE BESYLATE 5 MG PO TABS
5.0000 mg | ORAL_TABLET | Freq: Every evening | ORAL | Status: DC
  Administered 2016-03-19: 5 mg via ORAL
  Filled 2016-03-18: qty 1

## 2016-03-18 MED ORDER — PROPOFOL 500 MG/50ML IV EMUL
INTRAVENOUS | Status: DC | PRN
  Administered 2016-03-18: 50 ug/kg/min via INTRAVENOUS

## 2016-03-18 MED ORDER — MAGNESIUM CITRATE PO SOLN: 1.0000 | Freq: Once | ORAL | Status: DC | PRN

## 2016-03-18 MED ORDER — LACTATED RINGERS IV SOLN
INTRAVENOUS | Status: DC
  Administered 2016-03-18 (×2): via INTRAVENOUS

## 2016-03-18 MED ORDER — TRANEXAMIC ACID 1000 MG/10ML IV SOLN
INTRAVENOUS | Status: DC | PRN
  Administered 2016-03-18: 1000 mg via INTRAVENOUS

## 2016-03-18 MED ORDER — MAGNESIUM HYDROXIDE 400 MG/5ML PO SUSP
30.0000 mL | Freq: Every day | ORAL | Status: DC | PRN
  Administered 2016-03-20: 30 mL via ORAL
  Filled 2016-03-18: qty 30

## 2016-03-18 MED ORDER — ZOLPIDEM TARTRATE 5 MG PO TABS: 5.0000 mg | ORAL_TABLET | Freq: Every evening | ORAL | Status: DC | PRN

## 2016-03-18 MED ORDER — METOCLOPRAMIDE HCL 10 MG PO TABS: 5.0000 mg | ORAL_TABLET | Freq: Three times a day (TID) | ORAL | Status: DC | PRN

## 2016-03-18 MED ORDER — MENTHOL 3 MG MT LOZG: 1.0000 | LOZENGE | OROMUCOSAL | Status: DC | PRN

## 2016-03-18 MED ORDER — DIPHENHYDRAMINE HCL 12.5 MG/5ML PO ELIX: 12.5000 mg | ORAL_SOLUTION | ORAL | Status: DC | PRN

## 2016-03-18 MED ORDER — PHENOL 1.4 % MT LIQD: 1.0000 | OROMUCOSAL | Status: DC | PRN

## 2016-03-18 MED ORDER — ONDANSETRON HCL 4 MG/2ML IJ SOLN: 4.0000 mg | Freq: Four times a day (QID) | INTRAMUSCULAR | Status: DC | PRN

## 2016-03-18 MED ORDER — FENTANYL CITRATE (PF) 100 MCG/2ML IJ SOLN: 25.0000 ug | INTRAMUSCULAR | Status: DC | PRN

## 2016-03-18 MED ORDER — DOCUSATE SODIUM 100 MG PO CAPS
100.0000 mg | ORAL_CAPSULE | Freq: Two times a day (BID) | ORAL | Status: DC
  Administered 2016-03-18 – 2016-03-21 (×6): 100 mg via ORAL
  Filled 2016-03-18 (×6): qty 1

## 2016-03-18 MED ORDER — MORPHINE SULFATE (PF) 10 MG/ML IV SOLN
INTRAVENOUS | Status: AC
  Filled 2016-03-18: qty 1

## 2016-03-18 MED ORDER — VITAMIN B-12 1000 MCG PO TABS
1000.0000 ug | ORAL_TABLET | Freq: Every day | ORAL | Status: DC
  Administered 2016-03-19 – 2016-03-21 (×3): 1000 ug via ORAL
  Filled 2016-03-18 (×3): qty 1

## 2016-03-18 MED ORDER — CEFAZOLIN SODIUM-DEXTROSE 2-4 GM/100ML-% IV SOLN
2.0000 g | Freq: Four times a day (QID) | INTRAVENOUS | Status: AC
  Administered 2016-03-18 (×3): 2 g via INTRAVENOUS
  Filled 2016-03-18 (×3): qty 100

## 2016-03-18 MED ORDER — SODIUM CHLORIDE 0.9 % IV SOLN
INTRAVENOUS | Status: DC
  Administered 2016-03-18 – 2016-03-19 (×2): via INTRAVENOUS

## 2016-03-18 MED ORDER — ACETAMINOPHEN 650 MG RE SUPP: 650.0000 mg | Freq: Four times a day (QID) | RECTAL | Status: DC | PRN

## 2016-03-18 SURGICAL SUPPLY — 60 items
BANDAGE ACE 6X5 VEL STRL LF (GAUZE/BANDAGES/DRESSINGS) ×2 IMPLANT
BLADE SAW 1 (BLADE) ×2 IMPLANT
BLOCK CUTTING FEMUR 7 LT (MISCELLANEOUS) IMPLANT
BLOCK CUTTING TIBIAL 6 LT (MISCELLANEOUS) IMPLANT
CANISTER SUCT 1200ML W/VALVE (MISCELLANEOUS) ×2 IMPLANT
CANISTER SUCT 3000ML (MISCELLANEOUS) ×4 IMPLANT
CAPT KNEE TOTAL 3 ×2 IMPLANT
CATH FOL LEG HOLDER (MISCELLANEOUS) ×2 IMPLANT
CATH TRAY METER 16FR LF (MISCELLANEOUS) ×2 IMPLANT
CEMENT HV SMART SET (Cement) ×4 IMPLANT
CHLORAPREP W/TINT 26ML (MISCELLANEOUS) ×4 IMPLANT
COOLER POLAR GLACIER W/PUMP (MISCELLANEOUS) ×2 IMPLANT
CUFF TOURN 24 STER (MISCELLANEOUS) IMPLANT
CUFF TOURN 30 STER DUAL PORT (MISCELLANEOUS) ×2 IMPLANT
DRAPE INCISE IOBAN 66X45 STRL (DRAPES) ×4 IMPLANT
DRAPE SHEET LG 3/4 BI-LAMINATE (DRAPES) ×4 IMPLANT
ELECT CAUTERY BLADE 6.4 (BLADE) ×2 IMPLANT
ELECT REM PT RETURN 9FT ADLT (ELECTROSURGICAL) ×2
ELECTRODE REM PT RTRN 9FT ADLT (ELECTROSURGICAL) ×1 IMPLANT
GAUZE PETRO XEROFOAM 1X8 (MISCELLANEOUS) ×2 IMPLANT
GAUZE SPONGE 4X4 12PLY STRL (GAUZE/BANDAGES/DRESSINGS) ×2 IMPLANT
GLOVE BIOGEL PI IND STRL 9 (GLOVE) ×1 IMPLANT
GLOVE BIOGEL PI INDICATOR 9 (GLOVE) ×1
GLOVE INDICATOR 8.0 STRL GRN (GLOVE) ×2 IMPLANT
GLOVE SURG ORTHO 8.0 STRL STRW (GLOVE) ×2 IMPLANT
GLOVE SURG SYN 9.0  PF PI (GLOVE) ×1
GLOVE SURG SYN 9.0 PF PI (GLOVE) ×1 IMPLANT
GOWN SRG 2XL LVL 4 RGLN SLV (GOWNS) ×1 IMPLANT
GOWN STRL NON-REIN 2XL LVL4 (GOWNS) ×1
GOWN STRL REUS W/ TWL LRG LVL3 (GOWN DISPOSABLE) ×1 IMPLANT
GOWN STRL REUS W/ TWL XL LVL3 (GOWN DISPOSABLE) ×1 IMPLANT
GOWN STRL REUS W/TWL LRG LVL3 (GOWN DISPOSABLE) ×1
GOWN STRL REUS W/TWL XL LVL3 (GOWN DISPOSABLE) ×1
HANDPIECE INTERPULSE COAX TIP (DISPOSABLE) ×1
HOOD PEEL AWAY FLYTE STAYCOOL (MISCELLANEOUS) ×4 IMPLANT
IMMBOLIZER KNEE 19 BLUE UNIV (SOFTGOODS) ×2 IMPLANT
KIT RM TURNOVER STRD PROC AR (KITS) ×2 IMPLANT
KNEE MEDACTA TIBIAL/FEMORAL BL (Knees) ×2 IMPLANT
KNIFE SCULPS 14X20 (INSTRUMENTS) ×2 IMPLANT
NDL SAFETY 18GX1.5 (NEEDLE) ×2 IMPLANT
NEEDLE SPNL 18GX3.5 QUINCKE PK (NEEDLE) ×2 IMPLANT
NEEDLE SPNL 20GX3.5 QUINCKE YW (NEEDLE) ×2 IMPLANT
NS IRRIG 1000ML POUR BTL (IV SOLUTION) ×2 IMPLANT
PACK TOTAL KNEE (MISCELLANEOUS) ×2 IMPLANT
PAD WRAPON POLAR KNEE (MISCELLANEOUS) ×1 IMPLANT
SET HNDPC FAN SPRY TIP SCT (DISPOSABLE) ×1 IMPLANT
SOL .9 NS 3000ML IRR  AL (IV SOLUTION) ×1
SOL .9 NS 3000ML IRR UROMATIC (IV SOLUTION) ×1 IMPLANT
STAPLER SKIN PROX 35W (STAPLE) ×2 IMPLANT
SUCTION FRAZIER HANDLE 10FR (MISCELLANEOUS) ×1
SUCTION TUBE FRAZIER 10FR DISP (MISCELLANEOUS) ×1 IMPLANT
SUT DVC 2 QUILL PDO  T11 36X36 (SUTURE) ×1
SUT DVC 2 QUILL PDO T11 36X36 (SUTURE) ×1 IMPLANT
SUT DVC QUILL MONODERM 30X30 (SUTURE) ×2 IMPLANT
SYR 20CC LL (SYRINGE) ×2 IMPLANT
SYR 50ML LL SCALE MARK (SYRINGE) ×4 IMPLANT
TIBIAL BONE MODEL LEFT (MISCELLANEOUS) IMPLANT
TOWEL OR 17X26 4PK STRL BLUE (TOWEL DISPOSABLE) ×2 IMPLANT
TOWER CARTRIDGE SMART MIX (DISPOSABLE) ×2 IMPLANT
WRAPON POLAR PAD KNEE (MISCELLANEOUS) ×2

## 2016-03-18 NOTE — Anesthesia Procedure Notes (Signed)
Spinal  Patient location during procedure: OR Start time: 03/18/2016 7:25 AM End time: 03/18/2016 7:30 AM Staffing Anesthesiologist: Gunnar Fusi Performed: anesthesiologist  Preanesthetic Checklist Completed: patient identified, site marked, surgical consent, pre-op evaluation, timeout performed, IV checked, risks and benefits discussed and monitors and equipment checked Spinal Block Patient position: sitting Prep: Betadine Patient monitoring: heart rate, continuous pulse ox, blood pressure and cardiac monitor Approach: midline Location: L4-5 Injection technique: single-shot Needle Needle type: Whitacre and Introducer  Needle gauge: 24 G Needle length: 9 cm Additional Notes Negative paresthesia. Negative blood return. Positive free-flowing CSF. Expiration date of kit checked and confirmed. Patient tolerated procedure well, without complications.  Dray Dente 1st Attempt spinal no success.  MDA Kephart 2nd attempt successful spinal.  No complications

## 2016-03-18 NOTE — H&P (Signed)
Reviewed paper H+P, will be scanned into chart. No changes noted.  

## 2016-03-18 NOTE — Anesthesia Procedure Notes (Signed)
Date/Time: 03/18/2016 8:14 AM Performed by: Nelda Marseille Pre-anesthesia Checklist: Patient identified, Emergency Drugs available, Suction available, Patient being monitored and Timeout performed Oxygen Delivery Method: Simple face mask

## 2016-03-18 NOTE — Op Note (Signed)
03/18/2016  9:43 AM  PATIENT:  Travis [name]  82 y.o. male  PRE-OPERATIVE DIAGNOSIS:  primary osteoarthritis left knee  POST-OPERATIVE DIAGNOSIS:  primary osteoarthritis left knee  PROCEDURE:  Procedure(s): TOTAL KNEE ARTHROPLASTY (Left)  SURGEON: Laurene Footman, MD  ASSISTANTS: Rachelle Hora [facility]  ANESTHESIA:   spinal  EBL:  Total I/O In: 1000 [I.V.:1000] Out: 325 [Urine:125; Blood:200]  BLOOD ADMINISTERED:none  DRAINS: none   LOCAL MEDICATIONS USED:  MARCAINE    and OTHER Exparel, morphine  SPECIMEN:  No Specimen  DISPOSITION OF SPECIMEN:  N/A  COUNTS:  YES  TOURNIQUET:   27 minutes at 300 mmHg  IMPLANTS: Medacta GMK sphere left 7 femur 6 tibia with short stem and 10 mm insert, 4 patella, all components cemented  DICTATION: .Dragon Dictation patient brought the operating room and after adequate spinal anesthesia was obtained left leg was prepped and draped in sterile fashion. After patient identification and timeout procedures were completed, midline skin incision was made with the knee in flexion followed by medial parapatellar arthrotomy. Knee showed eburnated bone in the medial compartment on both femoral and tibial sides with relative sparing of lateral compartment extensive patellofemoral degenerative change with spurs. Anterior cruciate ligament PCL and fat pad excised. Possible tibia cutting block was applied to the tibia and a proximal tibia cut carried out. Proximal distal femoral cut carried out in a similar fashion. The distal femoral 5 4-in-1 cutting guide was applied anterior posterior and chamfer cuts made and residual menisci removed at this time the 6 tibial trial was placed and proximal tibial preparation carried out. Once this was completed the tibial trial was placed and the distal femoral trial was placed with a 10 mm insert and this gave good stability and full extension. The distal femoral drill holes were made followed by the trochlear groove cut for the  distal femur followed by removal of all trials. The patella was cut at this point and 3 drill holes made it sized to size 4. At this point the local anesthetic noted above was infiltrated in the para-articular space. The wound was then irrigated with a tourniquet op and some the components cemented into place with the knee held in extension as components were cemented. After the cemented set the excess cement was removed and the knee again irrigated with pulsatile lavage. Tourniquet was let down and the arthrotomy was repaired using a heavy Quill for the arthrotomy with a 3-0 V-loc suture for subcutaneous closure followed by skin staples. Xeroform 4 x 4's ABDs and web roll and Ace wrap applied along Polar Care  PLAN OF CARE: Admit to inpatient   PATIENT DISPOSITION:  PACU - hemodynamically stable.

## 2016-03-18 NOTE — Transfer of Care (Signed)
Immediate Anesthesia Transfer of Care Note  Patient: Travis [name]  Procedure(s) Performed: Procedure(s): TOTAL KNEE ARTHROPLASTY (Left)  Patient Location: PACU  Anesthesia Type:Spinal  Level of Consciousness: sedated  Airway & Oxygen Therapy: Patient Spontanous Breathing and Patient connected to face mask oxygen  Post-op Assessment: Report given to RN and Post -op Vital signs reviewed and stable  Post vital signs: Reviewed and stable  Last Vitals:  Vitals:   03/18/16 0603  BP: 140/81  Pulse: (!) 55  Resp: 16  Temp: (!) 35.8 C    Last Pain:  Vitals:   03/18/16 0603  TempSrc: Tympanic  PainSc: 3          Complications: No apparent anesthesia complications

## 2016-03-18 NOTE — NC FL2 (Signed)
[place] LEVEL OF CARE SCREENING TOOL     IDENTIFICATION  Patient Name: Travis [name] Birthdate: 05/06/1932 Sex: male Admission Date (Current Location): 03/18/2016  [place] and [place] Number:  Engineering geologist and Address:  [facility], [address] [place], [place] [address]      Provider Number: [id number]  Attending Physician Name and Address:  Hessie Knows, MD  Relative Name and Phone Number:       Current Level of Care: Hospital Recommended Level of Care: [place] Prior Approval Number:    Date Approved/Denied:   PASRR Number:  ([id number])  Discharge Plan: SNF    Current Diagnoses: Patient Active Problem List   Diagnosis Date Noted  . Primary osteoarthritis of left knee 03/18/2016  . Hernia of abdominal cavity   . B12 deficiency 10/23/2015  . Difficulty walking 10/16/2015  . Left knee pain 10/16/2015  . MCI (mild cognitive impairment) 10/16/2015  . Health care maintenance 01/23/2015  . BPH with obstruction/lower urinary tract symptoms 01/01/2014  . HTN (hypertension), benign 01/01/2014  . Hypercholesterolemia 01/01/2014  . OA (osteoarthritis) 01/01/2014    Orientation RESPIRATION BLADDER Height & Weight     Self, Time, Situation, Place  O2 (Nasal Cannula 2L/min) External catheter Weight: 198 lb 8 oz (90 kg) Height:  5\' 9"  (175.3 cm)  BEHAVIORAL SYMPTOMS/MOOD NEUROLOGICAL BOWEL NUTRITION STATUS   (None.)  (None.) Continent Diet (Diet: Clear Liquid)  AMBULATORY STATUS COMMUNICATION OF NEEDS Skin   Extensive Assist Verbally Surgical wounds (Incision: Left Knee)                       Personal Care Assistance Level of Assistance  Bathing, Feeding, Dressing Bathing Assistance: Limited assistance Feeding assistance: Independent Dressing Assistance: Limited assistance     Functional Limitations Info  Sight, Hearing, Speech Sight Info: Adequate Hearing Info: Adequate Speech Info:  Adequate    SPECIAL CARE FACTORS FREQUENCY  PT (By licensed PT), OT (By licensed OT)     PT Frequency:  (5) OT Frequency:  (5)            Contractures      Additional Factors Info  Code Status, Allergies Code Status Info:  (Full Code) Allergies Info:  (No Known Allergies)           Current Medications (03/18/2016):  This is the current hospital active medication list Current Facility-Administered Medications  Medication Dose Route Frequency Provider Last Rate Last Dose  . 0.9 %  sodium chloride infusion   Intravenous Continuous Hessie Knows, MD 75 mL/hr at 03/18/16 1346    . acetaminophen (TYLENOL) tablet 650 mg  650 mg Oral Q6H PRN Hessie Knows, MD       Or  . acetaminophen (TYLENOL) suppository 650 mg  650 mg Rectal Q6H PRN Hessie Knows, MD      . amLODipine (NORVASC) tablet 5 mg  5 mg Oral QPM Hessie Knows, MD      . aspirin EC tablet 81 mg  81 mg Oral Daily Hessie Knows, MD      . bisacodyl (DULCOLAX) suppository 10 mg  10 mg Rectal Daily PRN Hessie Knows, MD      . ceFAZolin (ANCEF) IVPB 2g/100 mL premix  2 g Intravenous Q6H Hessie Knows, MD   2 g at 03/18/16 1344  . diphenhydrAMINE (BENADRYL) 12.5 MG/5ML elixir 12.5-25 mg  12.5-25 mg Oral Q4H PRN Hessie Knows, MD      . docusate  sodium (COLACE) capsule 100 mg  100 mg Oral BID Hessie Knows, MD      . Derrill Memo ON 03/19/2016] enoxaparin (LOVENOX) injection 30 mg  30 mg Subcutaneous Q12H Hessie Knows, MD      . magnesium citrate solution 1 Bottle  1 Bottle Oral Once PRN Hessie Knows, MD      . magnesium hydroxide (MILK OF MAGNESIA) suspension 30 mL  30 mL Oral Daily PRN Hessie Knows, MD      . menthol-cetylpyridinium (CEPACOL) lozenge 3 mg  1 lozenge Oral PRN Hessie Knows, MD       Or  . phenol (CHLORASEPTIC) mouth spray 1 spray  1 spray Mouth/Throat PRN Hessie Knows, MD      . methocarbamol (ROBAXIN) tablet 500 mg  500 mg Oral Q6H PRN Hessie Knows, MD   500 mg at 03/18/16 1320   Or  . methocarbamol (ROBAXIN) 500 mg in  dextrose 5 % 50 mL IVPB  500 mg Intravenous Q6H PRN Hessie Knows, MD      . metoCLOPramide (REGLAN) tablet 5-10 mg  5-10 mg Oral Q8H PRN Hessie Knows, MD       Or  . metoCLOPramide (REGLAN) injection 5-10 mg  5-10 mg Intravenous Q8H PRN Hessie Knows, MD      . morphine 2 MG/ML injection 2 mg  2 mg Intravenous Q1H PRN Hessie Knows, MD   2 mg at 03/18/16 1349  . ondansetron (ZOFRAN) tablet 4 mg  4 mg Oral Q6H PRN Hessie Knows, MD       Or  . ondansetron [facility]) injection 4 mg  4 mg Intravenous Q6H PRN Hessie Knows, MD      . oxyCODONE (Oxy IR/ROXICODONE) immediate release tablet 5-10 mg  5-10 mg Oral Q3H PRN Hessie Knows, MD   5 mg at 03/18/16 1320  . pravastatin (PRAVACHOL) tablet 40 mg  40 mg Oral QPM Hessie Knows, MD      . vitamin B-12 (CYANOCOBALAMIN) tablet 1,000 mcg  1,000 mcg Oral Daily Hessie Knows, MD      . zolpidem [facility]) tablet 5 mg  5 mg Oral QHS PRN Hessie Knows, MD         Discharge Medications: Please see discharge summary for a list of discharge medications.  Relevant Imaging Results:  Relevant Lab Results:   Additional Information  (SSN: [id number])  Danie Chandler, Student-Social Work

## 2016-03-18 NOTE — Evaluation (Signed)
Physical Therapy Evaluation Patient Details Name: Travis [name] MRN: [id number] DOB: 01/05/1932 Today's Date: 03/18/2016   History of Present Illness  Pt admitted for L TKR.   Clinical Impression  Pt is a pleasant 82 year old male who was admitted for L TKR. Pt performs bed mobility/transfers with min assist and ambulation with cga and RW. Pt able to perform 10 SLR with supervision, does not require KI at this time. Pt demonstrates deficits with strength/mobility/ROM/endurance/pain. Would benefit from skilled PT to address above deficits and promote optimal return to PLOF. Recommend transition to [place] upon discharge from acute hospitalization.       Follow Up Recommendations Home health PT;Supervision - Intermittent    Equipment Recommendations  Rolling walker with 5" wheels    Recommendations for Other Services       Precautions / Restrictions Precautions Precautions: Fall;Knee Precaution Booklet Issued: No Restrictions Weight Bearing Restrictions: Yes LLE Weight Bearing: Weight bearing as tolerated      Mobility  Bed Mobility Overal bed mobility: Needs Assistance Bed Mobility: Supine to Sit     Supine to sit: Min assist     General bed mobility comments: assist for sequencing. Pt able to intiate movement towards EOB. Once seated, pt able to sit with supervision. Increased pain noted with movement  Transfers Overall transfer level: Needs assistance Equipment used: Rolling walker (2 wheeled) Transfers: Sit to/from Stand Sit to Stand: Min assist         General transfer comment: transfers performed with cues to push from seated surface. Once standing, pt able to stand with upright posture. RW used for safe technique. No LOB noted  Ambulation/Gait Ambulation/Gait assistance: Min guard Ambulation Distance (Feet): 3 Feet Assistive device: Rolling walker (2 wheeled) Gait Pattern/deviations: Step-to pattern     General Gait Details: ambulated using RW and step  to gait pattern. Safe technique performed with pt able to follow commads correctly  Stairs            Wheelchair Mobility    Modified Rankin (Stroke Patients Only)       Balance Overall balance assessment: Needs assistance Sitting-balance support: Feet supported;Bilateral upper extremity supported Sitting balance-Leahy Scale: Good     Standing balance support: Bilateral upper extremity supported Standing balance-Leahy Scale: Good                               Pertinent Vitals/Pain Pain Assessment: 0-10 Pain Score: 7  Pain Location: L knee Pain Descriptors / Indicators: Operative site guarding Pain Intervention(s): Limited activity within patient's tolerance;Repositioned;Ice applied    Home Living Family/patient expects to be discharged to:: Private residence Living Arrangements: Children Available Help at Discharge: Family;Available 24 hours/day Type of Home: House Home Access: Stairs to enter Entrance Stairs-Rails: Can reach both Entrance Stairs-Number of Steps: 2 Home Layout: One level Home Equipment: Walker - standard;Cane - single point;Bedside commode      Prior Function Level of Independence: Independent with assistive device(s)         Comments: Very active and ambulating outside of home environment     Hand Dominance        Extremity/Trunk Assessment   Upper Extremity Assessment: Generalized weakness (B UE grossly 4/5)           Lower Extremity Assessment: Generalized weakness (L LE grossly 3-/5; R LE grossly 4/5)         Communication   Communication: No difficulties  Cognition Arousal/Alertness:  Awake/alert Behavior During Therapy: WFL for tasks assessed/performed Overall Cognitive Status: Within Functional Limits for tasks assessed                      General Comments      Exercises Total Joint Exercises Goniometric ROM: L knee in 18 degrees of exention. Towel roll placed to promote extension. Unable  to perform flexion this date secondary to pain while in recliner Other Exercises Other Exercises: Supine ther-ex performed including ankle pumps, quad sets, SLRs, and hip abd/add,. All ther-ex performed on L LE x 10 reps with min assist.. Excessive time spent on educating on ther-ex and correct technique along with answering questions from family.   Assessment/Plan    PT Assessment Patient needs continued PT services  PT Problem List Decreased strength;Decreased range of motion;Decreased activity tolerance;Decreased balance;Decreased mobility;Pain          PT Treatment Interventions Gait training;Therapeutic exercise;DME instruction;Stair training    PT Goals (Current goals can be found in the Care Plan section)  Acute Rehab PT Goals Patient Stated Goal: to go home PT Goal Formulation: With patient Time For Goal Achievement: 04/01/16 Potential to Achieve Goals: Good    Frequency BID   Barriers to discharge        Co-evaluation               End of Session Equipment Utilized During Treatment: Gait belt Activity Tolerance: Patient limited by pain Patient left: in chair;with chair alarm set;with family/visitor present;with SCD's reapplied Nurse Communication: Mobility status         Time: [id number] PT Time Calculation (min) (ACUTE ONLY): 39 min   Charges:   PT Evaluation $PT Eval Moderate Complexity: 1 Procedure PT Treatments $Therapeutic Exercise: 23-37 mins   PT G Codes:        Mary Hockey 2016-04-15, 5:27 PM Greggory Stallion, PT, DPT [phone]

## 2016-03-18 NOTE — Anesthesia Preprocedure Evaluation (Signed)
Anesthesia Evaluation  Patient identified by MRN, date of birth, ID band Patient awake    Reviewed: Allergy & Precautions, NPO status , Patient's Chart, lab work & pertinent test results  History of Anesthesia Complications Negative for: history of anesthetic complications  Airway Mallampati: II       Dental   Pulmonary neg pulmonary ROS,           Cardiovascular hypertension, Pt. on medications      Neuro/Psych negative neurological ROS     GI/Hepatic negative GI ROS, Neg liver ROS,   Endo/Other  negative endocrine ROS  Renal/GU negative Renal ROS     Musculoskeletal   Abdominal   Peds  Hematology negative hematology ROS (+)   Anesthesia Other Findings   Reproductive/Obstetrics                             Anesthesia Physical Anesthesia Plan  ASA: II  Anesthesia Plan: Spinal   Post-op Pain Management:    Induction:   Airway Management Planned:   Additional Equipment:   Intra-op Plan:   Post-operative Plan:   Informed Consent: I have reviewed the patients History and Physical, chart, labs and discussed the procedure including the risks, benefits and alternatives for the proposed anesthesia with the patient or authorized representative who has indicated his/her understanding and acceptance.     Plan Discussed with:   Anesthesia Plan Comments:         Anesthesia Quick Evaluation

## 2016-03-19 ENCOUNTER — Encounter: Payer: Self-pay | Admitting: Orthopedic Surgery

## 2016-03-19 LAB — CBC
HCT: 35.6 % — ABNORMAL LOW (ref 40.0–52.0)
Hemoglobin: 11.7 g/dL — ABNORMAL LOW (ref 13.0–18.0)
MCH: 24.8 pg — ABNORMAL LOW (ref 26.0–34.0)
MCHC: 32.9 g/dL (ref 32.0–36.0)
MCV: 75.4 fL — ABNORMAL LOW (ref 80.0–100.0)
Platelets: 151 10*3/uL (ref 150–440)
RBC: 4.72 MIL/uL (ref 4.40–5.90)
RDW: 16.5 % — ABNORMAL HIGH (ref 11.5–14.5)
WBC: 8.5 10*3/uL (ref 3.8–10.6)

## 2016-03-19 LAB — BASIC METABOLIC PANEL
Anion gap: 7 (ref 5–15)
BUN: 11 mg/dL (ref 6–20)
CO2: 28 mmol/L (ref 22–32)
Calcium: 8.6 mg/dL — ABNORMAL LOW (ref 8.9–10.3)
Chloride: 99 mmol/L — ABNORMAL LOW (ref 101–111)
Creatinine, Ser: 0.86 mg/dL (ref 0.61–1.24)
GFR calc Af Amer: >60 mL/min (ref >60)
GFR calc non Af Amer: >60 mL/min (ref >60)
Glucose, Bld: 122 mg/dL — ABNORMAL HIGH (ref 65–99)
Potassium: 3.9 mmol/L (ref 3.5–5.1)
Sodium: 134 mmol/L — ABNORMAL LOW (ref 135–145)

## 2016-03-19 NOTE — Anesthesia Postprocedure Evaluation (Signed)
Anesthesia Post Note  Patient: HASKER DUNSTON  Procedure(s) Performed: Procedure(s) (LRB): TOTAL KNEE ARTHROPLASTY (Left)  Patient location during evaluation: Nursing Unit Anesthesia Type: Spinal Level of consciousness: awake, awake and alert and oriented Pain management: pain level controlled Vital Signs Assessment: post-procedure vital signs reviewed and stable Respiratory status: spontaneous breathing, nonlabored ventilation and respiratory function stable Cardiovascular status: stable Postop Assessment: no headache, no backache, no signs of nausea or vomiting, patient able to bend at knees and adequate PO intake Anesthetic complications: no    Last Vitals:  Vitals:   03/19/16 0344 03/19/16 0749  BP: 121/68 124/62  Pulse: 61 (!) 59  Resp: 18 16  Temp: 36.9 C 37.2 C    Last Pain:  Vitals:   03/19/16 0749  TempSrc: Oral  PainSc:                  Darlyne Russian

## 2016-03-19 NOTE — Evaluation (Signed)
Occupational Therapy Evaluation Patient Details Name: Travis [name] MRN: [id number] DOB: June 18, 1931 Today's Date: 03/19/2016    History of Present Illness Pt admitted for L TKR.    Clinical Impression   Pt is 82 year old male s/p L TKR.  Pt lived alone and was independent in all ADLs prior to surgery and is eager to return to PLOF.  Pt currently requires max assist for LB dressing due to increased pain and lethargy from pain medications.  He was able to attend to education and demonstration of AD for dressing LB but was not able to tolerate sitting EOB due to just completing PT session.  Pt would benefit from instruction in dressing techniques with or without assistive devices for dressing and bathing skills.  Pt would also benefit from recommendations for home modifications to increase safety in the bathroom and prevent falls. Pt would like to return home again and live in upstairs portion of his 2 story home, but SNF is recommended due to safety concerns regarding limited mobility with PT sessions and pain.    Follow Up Recommendations  SNF    Equipment Recommendations       Recommendations for Other Services       Precautions / Restrictions Precautions Precautions: Fall;Knee Precaution Booklet Issued: No Required Braces or Orthoses: Knee Immobilizer - Left Restrictions Weight Bearing Restrictions: Yes LLE Weight Bearing: Weight bearing as tolerated      Mobility Bed Mobility Overal bed mobility: Needs Assistance Bed Mobility: Sit to Supine     Supine to sit: Mod assist     General bed mobility comments: Able to scoot along edge of bed; assist for LEs into bed and assist of 2 to scoot up in bed as well as use of trapeze  Transfers Overall transfer level: Needs assistance Equipment used: Rolling walker (2 wheeled) Transfers: Sit to/from Stand Sit to Stand: Mod assist         General transfer comment: from recliner, very slow to rise. Instruction cues for use  of RLE (immobilizer on L) and assist to slide LLE back . Difficulty attaining full upright position and finding COG for balance. Retropulsive initially. Rw was lowered for improved use of UEs to assist for gait    Balance Overall balance assessment: Needs assistance Sitting-balance support: Feet supported;Bilateral upper extremity supported Sitting balance-Leahy Scale: Good     Standing balance support: Bilateral upper extremity supported Standing balance-Leahy Scale: Fair                              ADL Overall ADL's : Needs assistance/impaired Eating/Feeding: Independent;Set up   Grooming: Wash/dry hands;Wash/dry face;Oral care;Applying deodorant;Brushing hair;Independent;Set up           Upper Body Dressing : Independent;Set up   Lower Body Dressing: Maximal assistance;Set up Lower Body Dressing Details (indicate cue type and reason): demosntrated use of reacher and sock aid.  Pt too sleepy to sit up EOB to practice due to just completing PT session.  Unable to reach feet in bed for LB dressing.  KI in place                     Vision     Perception     Praxis      Pertinent Vitals/Pain Pain Assessment: 0-10 Pain Score: 5  Pain Location: L knee Pain Descriptors / Indicators: Aching;Operative site guarding Pain Intervention(s): Limited activity within patient's tolerance;Monitored  during session;Premedicated before session;Ice applied     Hand Dominance Right   Extremity/Trunk Assessment Upper Extremity Assessment Upper Extremity Assessment: Generalized weakness   Lower Extremity Assessment Lower Extremity Assessment: Defer to PT evaluation       Communication Communication Communication: No difficulties   Cognition Arousal/Alertness: Lethargic Behavior During Therapy: WFL for tasks assessed/performed Overall Cognitive Status: Within Functional Limits for tasks assessed                     General Comments       Exercises  Exercises: Total Joint;Other exercises Other Exercises Other Exercises: Quad sets in long sit, stand and supine; trace contraction   Shoulder Instructions      Home Living Family/patient expects to be discharged to:: Private residence Living Arrangements: Children Available Help at Discharge: Family;Available 24 hours/day Type of Home: House Home Access: Stairs to enter [employer] of Steps: 2 Entrance Stairs-Rails: Can reach both Home Layout: One level     Bathroom Shower/Tub: Tub/shower unit;Walk-in shower (pt has tub with shower upstairs and shower stall downstairs with chair) Shower/tub characteristics: Curtain Bathroom Toilet: Handicapped height (uses BSC over toilet) Bathroom Accessibility: Yes How Accessible: Accessible via walker Home Equipment: Walker - standard;Cane - single point;Bedside commode;Shower seat;Grab bars - tub/shower;Adaptive equipment Adaptive Equipment: Reacher        Prior Functioning/Environment Level of Independence: Independent with assistive device(s)        Comments: Very active and ambulating outside of home environment        OT Problem List: Decreased strength;Decreased range of motion;Decreased activity tolerance;Decreased knowledge of use of DME or AE;Pain   OT Treatment/Interventions: Self-care/ADL training;Patient/family education;DME and/or AE instruction    OT Goals(Current goals can be found in the care plan section) Acute Rehab OT Goals Patient Stated Goal: to go home OT Goal Formulation: With patient/family Time For Goal Achievement: 04/02/16 Potential to Achieve Goals: Good ADL Goals Pt Will Perform Lower Body Dressing: with set-up;with min assist;with adaptive equipment;sit to/from stand (with FWW and no LOB) Pt Will Transfer to Toilet: with set-up;with min assist;bedside commode (BSC over toilet)  OT Frequency: Min 1X/week   Barriers to D/C:    was living alone prior to surgery but has a lot of family  available to assist as needed        Co-evaluation              End of Session    Activity Tolerance: Patient limited by lethargy Patient left: in bed;with call bell/phone within reach;with bed alarm set;with family/visitor present   Time: 1420-1445 OT Time Calculation (min): 25 min Charges:  OT General Charges $OT Visit: 1 Procedure OT Evaluation $OT Eval Moderate Complexity: 1 Procedure OT Treatments $Self Care/Home Management : 8-22 mins G-Codes:    Chrys Racer, OTR/L ascom [phone] 03/19/16, 2:58 PM

## 2016-03-19 NOTE — Progress Notes (Signed)
Physical Therapy Treatment Patient Details Name: Travis [name] MRN: [id number] DOB: 1932/02/27 Today's Date: 03/19/2016    History of Present Illness Pt admitted for L TKR.     PT Comments    Pt continues up in chair and agreeable to PT; lethargic. Pt requesting pain medication and daughter has already called nursing. Pt received pain medication during session. Pt quad set on left poor with only trace contraction; worked on in long sit, stand and supine with education provided to pt/family on necessity of practicing often. Pt continues to struggle with all mobility; all mobility extremely slow and effortful requiring assist. Knee immobilizer donned prior to stand transfer from recliner. Increased assist required to stand from recliner as well as sit to bed. Ambulation continues to require assist to advance Left lower extremity forward and sideways despite knee immobilizer and lowering rolling walker for increased use of upper extremities; pt able to advance backward. Pt/family instructed to keep immobilizer on in bed, as pt left knee lacking extension and pt has desire to externally rotate leg and bend knee. Pt could not tolerate foam for elevation; therefore, place double roll under ankle with improvement/compliance. Spoke with nursing regarding session. Continue PT to progress range, strength, endurance and balance to improve all functional mobility.   Follow Up Recommendations  SNF     Equipment Recommendations  Rolling walker with 5" wheels    Recommendations for Other Services       Precautions / Restrictions Precautions Precautions: Fall;Knee Precaution Booklet Issued: No Required Braces or Orthoses: Knee Immobilizer - Left Restrictions Weight Bearing Restrictions: Yes LLE Weight Bearing: Weight bearing as tolerated    Mobility  Bed Mobility Overal bed mobility: Needs Assistance Bed Mobility: Sit to Supine     Supine to sit: Mod assist     General bed mobility  comments: Able to scoot along edge of bed; assist for LEs into bed and assist of 2 to scoot up in bed as well as use of trapeze  Transfers Overall transfer level: Needs assistance Equipment used: Rolling walker (2 wheeled) Transfers: Sit to/from Stand Sit to Stand: Mod assist         General transfer comment: from recliner, very slow to rise. Instruction cues for use of RLE (immobilizer on L) and assist to slide LLE back . Difficulty attaining full upright position and finding COG for balance. Retropulsive initially. Rw was lowered for improved use of UEs to assist for gait  Ambulation/Gait Ambulation/Gait assistance: Mod assist;+2 safety/equipment Ambulation Distance (Feet): 5 Feet (side step 3 feet up towards head of bed) Assistive device: Rolling walker (2 wheeled) Gait Pattern/deviations: Step-to pattern;Antalgic;Trunk flexed;Decreased weight shift to left;Decreased stance time - left;Decreased step length - right;Decreased step length - left;Decreased dorsiflexion - left;Decreased dorsiflexion - right Gait velocity: very slow Gait velocity interpretation: <1.8 ft/sec, indicative of risk for recurrent falls General Gait Details: L immobilizer; difficulty accepting weight though LLE; therefore unable to actually clear RLE (pivots foot). Assist to advance LLE fwd and side; able to advance bkwd. Very slow small steps with long pauses. Approximately 20 to get from chair to bed   Stairs            Wheelchair Mobility    Modified Rankin (Stroke Patients Only)       Balance Overall balance assessment: Needs assistance Sitting-balance support: Feet supported;Bilateral upper extremity supported Sitting balance-Leahy Scale: Good     Standing balance support: Bilateral upper extremity supported Standing balance-Leahy Scale: Fair  Cognition Arousal/Alertness: Lethargic Behavior During Therapy: WFL for tasks assessed/performed Overall Cognitive  Status: Within Functional Limits for tasks assessed                      Exercises Total Joint Exercises Goniometric ROM: L knee AAROM: 18-65 degrees Towel roll placed to maintain extension Other Exercises Other Exercises: Quad sets in long sit, stand and supine; trace contraction    General Comments        Pertinent Vitals/Pain Pain Assessment: 0-10 Pain Score: 6  Pain Location: L knee Pain Descriptors / Indicators: Operative site guarding Pain Intervention(s): Limited activity within patient's tolerance;Monitored during session;Patient requesting pain meds-RN notified;RN gave pain meds during session;Ice applied    Home Living                      Prior Function            PT Goals (current goals can now be found in the care plan section) Acute Rehab PT Goals Patient Stated Goal: to go home PT Goal Formulation: With patient Time For Goal Achievement: 04/01/16 Potential to Achieve Goals: Good Progress towards PT goals: Progressing toward goals    Frequency    BID      PT Plan Current plan remains appropriate    Co-evaluation             End of Session Equipment Utilized During Treatment: Gait belt Activity Tolerance: Patient limited by pain;Patient limited by lethargy Patient left: with family/visitor present;with SCD's reapplied;in bed;with call bell/phone within reach;with bed alarm set;Other (comment) (polar care in place)     Time: [id number] PT Time Calculation (min) (ACUTE ONLY): 51 min  Charges:  $Gait Training: 8-22 mins $Therapeutic Exercise: 8-22 mins $Therapeutic Activity: 8-22 mins                    G Codes:      Larae Grooms, PTA 03/19/2016, 2:17 PM

## 2016-03-19 NOTE — Progress Notes (Signed)
   Subjective: 1 Day Post-Op Procedure(s) (LRB): TOTAL KNEE ARTHROPLASTY (Left) Patient reports pain as 7 on 0-10 scale.   Patient is well, and has had no acute complaints or problems Denies any CP, SOB, ABD pain. We will continue therapy today.  Plan is to go Home after hospital stay.  Objective: Vital signs in last 24 hours: Temp:  [97.2 F (36.2 C)-98.9 F (37.2 C)] 98.4 F (36.9 C) (11/08 0344) Pulse Rate:  [42-96] 61 (11/08 0344) Resp:  [14-19] 18 (11/08 0344) BP: (109-173)/(57-81) 121/68 (11/08 0344) SpO2:  [92 %-100 %] 98 % (11/08 0344) FiO2 (%):  [28 %] 28 % (11/07 1052) Weight:  [90 kg (198 lb 8 oz)] 90 kg (198 lb 8 oz) (11/07 1109)  Intake/Output from previous day: 11/07 0701 - 11/08 0700 In: 3504.8 [P.O.:760; I.V.:2644.8; IV Piggyback:100] Out: 2385 [Urine:2185; Blood:200] Intake/Output this shift: Total I/O In: -  Out: 150 [Urine:150]   Recent Labs  03/18/16 1105 03/19/16 0344  HGB 13.4 11.7*    Recent Labs  03/18/16 1105 03/19/16 0344  WBC 5.4 8.5  RBC 5.49 4.72  HCT 41.6 35.6*  PLT 179 151    Recent Labs  03/18/16 1105 03/19/16 0344  NA  --  134*  K  --  3.9  CL  --  99*  CO2  --  28  BUN  --  11  CREATININE 1.00 0.86  GLUCOSE  --  122*  CALCIUM  --  8.6*   No results for input(s): LABPT, INR in the last 72 hours.  EXAM General - Patient is Alert, Appropriate and Oriented Extremity - Neurovascular intact Sensation intact distally Intact pulses distally Dorsiflexion/Plantar flexion intact No cellulitis present Compartment soft Dressing - dressing C/D/I and no drainage Motor Function - intact, moving foot and toes well on exam.   Past Medical History:  Diagnosis Date  . Arthritis   . Hyperlipidemia   . Hypertension     Assessment/Plan:   1 Day Post-Op Procedure(s) (LRB): TOTAL KNEE ARTHROPLASTY (Left) Active Problems:   Primary osteoarthritis of left knee  Estimated body mass index is 29.31 kg/m as calculated from  the following:   Height as of this encounter: 5\' 9"  (1.753 m).   Weight as of this encounter: 90 kg (198 lb 8 oz). Advance diet Up with therapy  Acute post op blood loss anemia, recheck labs in the am Needs BM CM to assist with discharge  DVT Prophylaxis - Lovenox, Foot Pumps and TED hose Weight-Bearing as tolerated to left leg   T. Rachelle Hora, PA-C [place] 03/19/2016, 7:38 AM

## 2016-03-19 NOTE — Clinical Social Work Note (Signed)
Clinical Social Work Assessment  Patient Details  Name: Travis [name] MRN: [id number] Date of Birth: 1931-06-27  Date of referral:  03/19/16               Reason for consult:  Facility Placement                Permission sought to share information with:  Chartered certified accountant granted to share information::  Yes, Verbal Permission Granted  Name::      [place]::   [facility]   Relationship::     Contact Information:     Housing/Transportation Living arrangements for the past 2 months:  [place] of Information:  Patient, Adult Children Patient Interpreter Needed:  None Criminal Activity/Legal Involvement Pertinent to Current Situation/Hospitalization:  No - Comment as needed Significant Relationships:  Adult Children, Other Family Members Lives with:  Self Do you feel safe going back to the place where you live?  Yes Need for family participation in patient care:  Yes (Comment)  Care giving concerns:  Patient lives alone in [place], [place] ([place]).    Social Worker assessment / plan:  Holiday representative (CSW) received SNF consult. PT recommended home health on post op day 0 and now on post op day 1 recommendation is SNF. CSW met with patient and [name] [phone] was at bedside. CSW introduced self and explained role of CSW department. Per daughter she remembers CSW from [employer]. Daughter reported that patient lives alone in [place] and she lives in [place]. Per daughter she is patient's primary support and he has 2 sons that are 64 y.o and 52 y.o that live up [place]. CSW explained SNF process. Daughter and patient are agreeable to SNF search in [place] and [place].   FL2 complete and faxed out. CSW presented bed offers. Per daughter she will discuss offers with patient and get back to CSW. Daughter reported that patient would still like to consider home  health. RN case manager is aware of above. CSW will continue to follow and assist as needed.     Employment status:  Part-Time Nurse, adult PT Recommendations:  [place] / Referral to community resources:  [place]  Patient/Family's Response to care:  Patient and daughter are agreeable to SNF search and would like to consider home health.   Patient/Family's Understanding of and Emotional Response to Diagnosis, Current Treatment, and Prognosis:  Patient and daughter were pleasant and thanked CSW for assistance.   Emotional Assessment Appearance:  Appears stated age Attitude/Demeanor/Rapport:    Affect (typically observed):  Accepting, Adaptable, Pleasant Orientation:  Oriented to Self, Oriented to Place, Oriented to  Time, Oriented to Situation Alcohol / Substance use:  Not Applicable Psych involvement (Current and /or in the community):  No (Comment)  Discharge Needs  Concerns to be addressed:  Discharge Planning Concerns Readmission within the last 30 days:  No Current discharge risk:  Dependent with Mobility Barriers to Discharge:  Continued Medical Work up   [employer], Travis Beets, LCSW 03/19/2016, 12:08 PM

## 2016-03-19 NOTE — Clinical Social Work Placement (Signed)
   CLINICAL SOCIAL WORK PLACEMENT  NOTE  Date:  03/19/2016  Patient Details  Name: Travis [name] MRN: [id number] Date of Birth: [birth year]  Clinical Social Work is seeking post-discharge placement for this patient at the [place] level of care (*CSW will initial, date and re-position this form in  chart as items are completed):  Yes   Patient/family provided with [place] Work Department's list of facilities offering this level of care within the geographic area requested by the patient (or if unable, by the patient's family).  Yes   Patient/family informed of their freedom to choose among providers that offer the needed level of care, that participate in Medicare, Medicaid or managed care program needed by the patient, have an available bed and are willing to accept the patient.  Yes   Patient/family informed of [facility]'s ownership interest in [facility] and [facility], as well as of the fact that they are under no obligation to receive care at these facilities.  PASRR submitted to EDS on 03/18/16     PASRR number received on 03/18/16     Existing PASRR number confirmed on       FL2 transmitted to all facilities in geographic area requested by pt/family on 03/19/16     FL2 transmitted to all facilities within larger geographic area on       Patient informed that his/her managed care company has contracts with or will negotiate with certain facilities, including the following:        Yes   Patient/family informed of bed offers received.  Patient chooses bed at       Physician recommends and patient chooses bed at      Patient to be transferred to   on  .  Patient to be transferred to facility by       Patient family notified on   of transfer.  Name of family member notified:        PHYSICIAN       Additional Comment:    _______________________________________________ Addisyn Leclaire, [place] Beets, LCSW 03/19/2016, 12:08 PM

## 2016-03-19 NOTE — Progress Notes (Signed)
Physical Therapy Treatment Patient Details Name: Travis [name] MRN: [id number] DOB: 04-20-1932 Today's Date: 03/19/2016    History of Present Illness Pt admitted for L TKR.     PT Comments    Pt is making good progress towards goals with pt motivated to perform therapy. Pt with increased pain and is limited by weakness this date in L LE. Needs increased assistance for OOB mobility and ambulation. +2 assist required to follow behind with recliner secondary to fatigue/pain. Needs assist for all there-ex. Would benefit from trial of KI in PM for extra support. Pt with decreased knee extension, needs foam or towel roll at all times. Placed towel roll in chair. At this time, pt unsafe to dc home as needs heavy assist. Recommend changing recommendations to SNF for intense rehab. Will continue to assess.  Follow Up Recommendations  SNF     Equipment Recommendations  Rolling walker with 5" wheels    Recommendations for Other Services       Precautions / Restrictions Precautions Precautions: Fall;Knee Precaution Booklet Issued: No Restrictions Weight Bearing Restrictions: Yes LLE Weight Bearing: Weight bearing as tolerated    Mobility  Bed Mobility Overal bed mobility: Needs Assistance Bed Mobility: Supine to Sit     Supine to sit: Mod assist     General bed mobility comments: assist for sequencing. Pt able to use hand and grab railing, however needs heavy assist for trunk support and scooting out towards EOB. Once seated at EOB, needs hand support on bed for balance.  Transfers Overall transfer level: Needs assistance Equipment used: Rolling walker (2 wheeled) Transfers: Sit to/from Stand Sit to Stand: Min assist         General transfer comment: slow transfers performed with safe technique. Post leaning noted once standing, with increased time required to obtain balance. Limited weight noted through L LE.  Ambulation/Gait Ambulation/Gait assistance: Mod assist;+2  safety/equipment Ambulation Distance (Feet): 8 Feet Assistive device: Rolling walker (2 wheeled) Gait Pattern/deviations: Step-to pattern     General Gait Details: ambulated using RW and slow step to gait pattern. Needs manual assist for stepping with L side and advancing L foot. Heavy cues for sequencing required. Pt feels L knee buckling, however no buckling noted. Agreeable to try KI this PM session.   Stairs            Wheelchair Mobility    Modified Rankin (Stroke Patients Only)       Balance                                    Cognition Arousal/Alertness: Lethargic Behavior During Therapy: WFL for tasks assessed/performed Overall Cognitive Status: Within Functional Limits for tasks assessed                      Exercises Total Joint Exercises Goniometric ROM: L knee AAROM: 18-65 degrees Towel roll placed to maintain extension Other Exercises Other Exercises: Supine ther-ex performed including ankle pumps, quad sets, SLRs, and hip abd/add,. All ther-ex performed on L LE x 12 reps with min assist.. Excessive time spent on educating on ther-ex and correct technique along with answering questions from family.    General Comments        Pertinent Vitals/Pain Pain Assessment: 0-10 Pain Score: 4  Pain Location: L knee Pain Descriptors / Indicators: Operative site guarding Pain Intervention(s): Limited activity within patient's tolerance;Ice applied  Home Living                      Prior Function            PT Goals (current goals can now be found in the care plan section) Acute Rehab PT Goals Patient Stated Goal: to go home PT Goal Formulation: With patient Time For Goal Achievement: 04/01/16 Potential to Achieve Goals: Good Progress towards PT goals: Progressing toward goals    Frequency    BID      PT Plan Discharge plan needs to be updated    Co-evaluation             End of Session Equipment Utilized  During Treatment: Gait belt Activity Tolerance: Patient limited by pain Patient left: in chair;with chair alarm set;with family/visitor present;with SCD's reapplied     Time: 1001-1040 PT Time Calculation (min) (ACUTE ONLY): 39 min  Charges:  $Gait Training: 23-37 mins $Therapeutic Exercise: 8-22 mins                    G Codes:      [place] April 03, 2016, 11:19 AM  Greggory Stallion, PT, DPT [phone]

## 2016-03-19 NOTE — Progress Notes (Signed)
Patient is A&O x4. Up with assist x1 and [employer]. Slow and needs a lot of cueing. Low tolerance for activity. Oral pain meds, with good relief. Urinating via urinal. Family at bedside. Calling out for patient needs. Honeycomb dressing intact with small amount of drainage. Bed/chair alarm on for safety. Polar care in use.

## 2016-03-19 NOTE — Progress Notes (Signed)
Clinical Education officer, museum (CSW) met with patient and his daughter this afternoon to follow up on SNF choice. Per patient he is going home and declined SNF. RN case manager aware of above. CSW will continue to follow and assist as needed.   [employer], LCSW [phone]

## 2016-03-20 LAB — BASIC METABOLIC PANEL
Anion gap: 7 (ref 5–15)
BUN: 12 mg/dL (ref 6–20)
CO2: 28 mmol/L (ref 22–32)
Calcium: 9.1 mg/dL (ref 8.9–10.3)
Chloride: 97 mmol/L — ABNORMAL LOW (ref 101–111)
Creatinine, Ser: 0.89 mg/dL (ref 0.61–1.24)
GFR calc Af Amer: >60 mL/min (ref >60)
GFR calc non Af Amer: >60 mL/min (ref >60)
Glucose, Bld: 113 mg/dL — ABNORMAL HIGH (ref 65–99)
Potassium: 3.8 mmol/L (ref 3.5–5.1)
Sodium: 132 mmol/L — ABNORMAL LOW (ref 135–145)

## 2016-03-20 LAB — CBC
HCT: 34 % — ABNORMAL LOW (ref 40.0–52.0)
Hemoglobin: 11.4 g/dL — ABNORMAL LOW (ref 13.0–18.0)
MCH: 24.9 pg — ABNORMAL LOW (ref 26.0–34.0)
MCHC: 33.4 g/dL (ref 32.0–36.0)
MCV: 74.5 fL — ABNORMAL LOW (ref 80.0–100.0)
Platelets: 137 10*3/uL — ABNORMAL LOW (ref 150–440)
RBC: 4.57 MIL/uL (ref 4.40–5.90)
RDW: 16.4 % — ABNORMAL HIGH (ref 11.5–14.5)
WBC: 8.9 10*3/uL (ref 3.8–10.6)

## 2016-03-20 MED ORDER — TRAMADOL HCL 50 MG PO TABS
50.0000 mg | ORAL_TABLET | Freq: Four times a day (QID) | ORAL | Status: DC
  Administered 2016-03-20: 100 mg via ORAL
  Administered 2016-03-20: 50 mg via ORAL
  Administered 2016-03-21: 100 mg via ORAL
  Administered 2016-03-21: 50 mg via ORAL
  Filled 2016-03-20: qty 2
  Filled 2016-03-20: qty 1
  Filled 2016-03-20: qty 2
  Filled 2016-03-20: qty 1

## 2016-03-20 NOTE — Care Management Note (Signed)
Case Management Note  Patient Details  Name: Travis [name] MRN: [id number] Date of Birth: February 11, 1932  Subjective/Objective:   POD # 2 left TKA.  Met with daughter, Vanessa Barbara [phone]) at bedside. Patient lying in bed but drowsy. Vanessa Barbara states patient wants to go home. Prior to admission he was living alone , independent and driving. He will have 24 hour family  caregivers once discharged. Provided her a list of home health agencies. [facility]erral to [facility] for [facility] PT. He will need a walker, ordered from [facility]. Pharmacy: [address] [phone]. Called Lovenox 40 mg, #14, no refills.   Action/Plan: [facility] for PT, Lovenox called in. Walker from [facility]  Expected Discharge Date:                  Expected Discharge Plan:  [place]  In-House Referral:     Discharge planning Services  CM Consult  Post Acute Care Choice:  Durable Medical Equipment, Home Health Choice offered to:  Adult Children  DME Arranged:  [facility] DME Agency:  [place] Arranged:  PT [place] Agency:  [facility]  Status of Service:  In process, will continue to follow  If discussed at Long Length of Stay Meetings, dates discussed:    Additional Comments:  Jolly Mango, RN 03/20/2016, 9:14 AM

## 2016-03-20 NOTE — Care Management Important Message (Signed)
Important Message  Patient Details  Name: Travis [name] MRN: [id number] Date of Birth: 01/03/1932   Medicare Important Message Given:  Yes    Jolly Mango, RN 03/20/2016, 9:00 AM

## 2016-03-20 NOTE — Progress Notes (Signed)
Patient is A&O x3, but forgetful. Daughter at bedside. Uses urinal. LBM, this shift. Assist x1 with [employer]. Slow gait with cueing. Changed pain meds to Tramadol, gave scheduled doses. No additional meds given for pain. Still sleepy most of the day. NSL. POD #2, LTK. Dressing intact. Bed alarm on for safety.

## 2016-03-20 NOTE — Progress Notes (Signed)
   Subjective: 2 Days Post-Op Procedure(s) (LRB): TOTAL KNEE ARTHROPLASTY (Left) Patient reports pain as moderate.   Patient is well, and has had no acute complaints or problems. Pain with extension. Denies any CP, SOB, ABD pain. We will continue therapy today.  Plan is to go Home after hospital stay.  Objective: Vital signs in last 24 hours: Temp:  [100 F (37.8 C)-100.8 F (38.2 C)] 100.4 F (38 C) (11/09 0440) Pulse Rate:  [64-74] 73 (11/09 0438) Resp:  [18-20] 20 (11/09 0438) BP: (119-144)/(69-76) 121/69 (11/09 0438) SpO2:  [90 %-92 %] 90 % (11/09 0438)  Intake/Output from previous day: 11/08 0701 - 11/09 0700 In: 480 [P.O.:480] Out: 150 [Urine:150] Intake/Output this shift: No intake/output data recorded.   Recent Labs  03/18/16 1105 03/19/16 0344 03/20/16 0426  HGB 13.4 11.7* 11.4*    Recent Labs  03/19/16 0344 03/20/16 0426  WBC 8.5 8.9  RBC 4.72 4.57  HCT 35.6* 34.0*  PLT 151 137*    Recent Labs  03/19/16 0344 03/20/16 0426  NA 134* 132*  K 3.9 3.8  CL 99* 97*  CO2 28 28  BUN 11 12  CREATININE 0.86 0.89  GLUCOSE 122* 113*  CALCIUM 8.6* 9.1   No results for input(s): LABPT, INR in the last 72 hours.  EXAM General - Patient is Alert, Appropriate and Oriented Extremity - Neurovascular intact Sensation intact distally Intact pulses distally Dorsiflexion/Plantar flexion intact No cellulitis present Compartment soft BLE Dressing - dressing C/D/I and no drainage, dressing changed Motor Function - intact, moving foot and toes well on exam.   Past Medical History:  Diagnosis Date  . Arthritis   . Hyperlipidemia   . Hypertension     Assessment/Plan:   2 Days Post-Op Procedure(s) (LRB): TOTAL KNEE ARTHROPLASTY (Left) Active Problems:   Primary osteoarthritis of left knee  Estimated body mass index is 29.31 kg/m as calculated from the following:   Height as of this encounter: 5\' 9"  (1.753 m).   Weight as of this encounter: 90 kg  (198 lb 8 oz). Advance diet Up with therapy  Acute post op blood loss anemia - Hgb stable Encourage incentive spirometer Needs BM CM to assist with discharge  DVT Prophylaxis - Lovenox, Foot Pumps and TED hose Weight-Bearing as tolerated to left leg   T. Rachelle Hora, PA-C [place] 03/20/2016, 8:13 AM

## 2016-03-20 NOTE — Progress Notes (Signed)
Social work Theatre manager met with patient and patient's daughter at bedside. Patient was working with OT. Social work Theatre manager asked if patient's daughter had a preference of a SNF. Patient's daughter chose [facility]. Social work Theatre manager will make [employer] at [facility] aware of above. Social work Theatre manager will continue to follow and assist as needed.  Danie Chandler, Social Work Intern  [phone]

## 2016-03-20 NOTE — Progress Notes (Addendum)
Patient refuses to keep towel roll under heels to keep them elevated due to pain. He wants to use his knee immobilizer while in the bed to keep it straight. Patient was educated. Patient and daughter taught lovenox injection administration and provided lovenox teaching supply box. Patients daughter correctly demonstrated an injection on the patient.

## 2016-03-20 NOTE — Progress Notes (Signed)
Physical Therapy Treatment Patient Details Name: Travis [name] MRN: [id number] DOB: [birth year] Today's Date: 03/20/2016    History of Present Illness Pt admitted for L TKR.     PT Comments    Pt lethargic, but agreeable to PT. Pt overall able to converse and follow instruction with significant increased time overall, but has occasional mild, pleasant confused episodes. Daughter present throughout session and instrumental at encouraging and assisting instruction to pt. Pt continues to requires Mod A for all mobility, but does show improved effort and ability to connect with ambulation sequence at times. Pt does have long pauses between movement and requires heavy verbal and tactile cueing/assist for posture, keeping eyes open, advancing Left lower extremity and weight shifting appropriately. In chair pt able to work on left knee flexion again with heavy instruction and cues to stay on task; later extension efforts in long sit with mildly improved extension. Knee immobilizer left off for sitting in chair with education provided to daughter on correct positioning and instructing pt often to work on quad sets. Quad set continues poor. Plan to see pt this afternoon to progress range, strength with hopeful improved cognition to improve all functional mobility. Family does not wish placement for pt, but would rather take pt home and provide 24 hour care. Discussed obstacles with daughter; she does note there is a level entry into pt's home and they have necessary equipment. Recommendation continues to be skilled nursing facility due to poor overall functional mobility requiring Mod A.   Follow Up Recommendations  SNF;Other (comment) (family refusing placement; want to take home)     Equipment Recommendations  Rolling walker with 5" wheels    Recommendations for Other Services       Precautions / Restrictions Precautions Precautions: Fall;Knee Required Braces or Orthoses: Knee Immobilizer -  Left Knee Immobilizer - Left: On when out of bed or walking Restrictions Weight Bearing Restrictions: Yes LLE Weight Bearing: Weight bearing as tolerated    Mobility  Bed Mobility Overal bed mobility: Needs Assistance Bed Mobility: Supine to Sit     Supine to sit: Mod assist     General bed mobility comments: Takes increased time; demonstrates partial bridge to edge of bed with increased instruction. Assist for LEs and trunk  Transfers Overall transfer level: Needs assistance Equipment used: Rolling walker (2 wheeled) Transfers: Sit to/from Stand Sit to Stand: Mod assist         General transfer comment: slow to rise and attain upright posture with increased verbal and tactile cueing; mildly retropulsive initially with inceased assist required until COG found  Ambulation/Gait Ambulation/Gait assistance: Mod assist Ambulation Distance (Feet): 4 Feet Assistive device: Rolling walker (2 wheeled) Gait Pattern/deviations: Step-to pattern     General Gait Details: Increased time and instruction for sequence, posture and weight shift with occasional assist to advance LLE. Difficulty accepting weight through LLE   Stairs            Wheelchair Mobility    Modified Rankin (Stroke Patients Only)       Balance Overall balance assessment: Needs assistance Sitting-balance support: No upper extremity supported;Feet supported Sitting balance-Leahy Scale: Good     Standing balance support: Bilateral upper extremity supported Standing balance-Leahy Scale: Fair                      Cognition Arousal/Alertness: Lethargic;Suspect due to medications Behavior During Therapy: [facility] for tasks assessed/performed Overall Cognitive Status: Within Functional Limits for tasks assessed  Exercises Total Joint Exercises Ankle Circles/Pumps: AROM;Both;20 reps (long sit) Quad Sets: Strengthening;Both;Other (comment) (12 reps long sit) Knee  Flexion: AROM;Left;Seated (10 min) Goniometric ROM: 15-62    General Comments        Pertinent Vitals/Pain Pain Assessment: 0-10 Pain Score: 10-Worst pain ever Pain Location: L knee Pain Intervention(s): Ice applied;RN gave pain meds during session;Monitored during session    Home Living                      Prior Function            PT Goals (current goals can now be found in the care plan section) Progress towards PT goals: Progressing toward goals    Frequency    BID      PT Plan Current plan remains appropriate    Co-evaluation             End of Session Equipment Utilized During Treatment: Gait belt Activity Tolerance: Patient limited by pain;Patient limited by lethargy Patient left: in chair;with family/visitor present;with call bell/phone within reach;Other (comment) (polar care; dtr with pt all times; did not wish alarm)     Time: [id number] PT Time Calculation (min) (ACUTE ONLY): 51 min  Charges:  $Gait Training: 8-22 mins $Therapeutic Exercise: 8-22 mins $Therapeutic Activity: 8-22 mins                    G CodesLarae Grooms, PTA 03/20/2016, 12:27 PM

## 2016-03-20 NOTE — Progress Notes (Signed)
Physical Therapy Treatment Patient Details Name: Travis [name] MRN: [id number] DOB: 1931-07-13 Today's Date: 03/20/2016    History of Present Illness Pt admitted for L TKR.     PT Comments    Pt continues lethargic; agreeable to PT. Daughter in room. Knee immobilizer doffed for work on left knee flexion; range increased to 67 degrees this session. Immobilizer donned for stand and ambulation; pt did improve ambulation quality and distance this session to 20 ft. Efforts continue to be great as pt's cognition still remains impaired from baseline requiring increased instruction and at times assist. Overall pt is able to demonstrate improved step clearance bilaterally. Mod A for bed mobility. Continue PT to progress range and strength in left lower extremity and balance to promote improved functional mobility.   Follow Up Recommendations  SNF;Other (comment) (family refusing placement; want to take home)     Equipment Recommendations  Rolling walker with 5" wheels (received in room already)    Recommendations for Other Services       Precautions / Restrictions Precautions Precautions: Fall;Knee Required Braces or Orthoses: Knee Immobilizer - Left Knee Immobilizer - Left: On when out of bed or walking Restrictions Weight Bearing Restrictions: Yes LLE Weight Bearing: Weight bearing as tolerated    Mobility  Bed Mobility Overal bed mobility: Needs Assistance Bed Mobility: Sit to Supine     Supine to sit: Mod assist Sit to supine: Mod assist   General bed mobility comments: Attempted to instruct in hook tech; continues to need assist to lift LEs despite hook tech. Needs assist to correct trunk once supine and increased cueing to encourage hip lift to adjust mid section  Transfers Overall transfer level: Needs assistance Equipment used: Rolling walker (2 wheeled) Transfers: Sit to/from Stand Sit to Stand: Mod assist         General transfer comment: From recliner; slow to  rise and increased cueing for sequence and extending hips/R knee as well as sliding LLE back to support self. Pt becomes confused and twist to the R and squat with need for greater assist to maintain balance. Able to assist pt into upright stance without additional assist.  Ambulation/Gait Ambulation/Gait assistance: Min assist;Mod assist Ambulation Distance (Feet): 20 Feet Assistive device: Rolling walker (2 wheeled) Gait Pattern/deviations: Step-to pattern;Trunk flexed Gait velocity: slow Gait velocity interpretation: <1.8 ft/sec, indicative of risk for recurrent falls General Gait Details: knee immobilzer donned; improved steps inconsistently with improved clearance of RLE. Pt does become confused at one point while ambulating and leaves go of rw with RUE reaching out of base of support with need for additional support to maintain balance/safety. Pt eventually redirected to rw and able to continue with ambulation.    Stairs            Wheelchair Mobility    Modified Rankin (Stroke Patients Only)       Balance Overall balance assessment: Needs assistance Sitting-balance support: Feet supported Sitting balance-Leahy Scale: Good     Standing balance support: Bilateral upper extremity supported Standing balance-Leahy Scale: Fair                      Cognition Arousal/Alertness: Lethargic;Suspect due to medications Behavior During Therapy: [facility] for tasks assessed/performed Overall Cognitive Status: Within Functional Limits for tasks assessed       Memory: Decreased short-term memory (Pt asked where he was but recalled hehad knee surgery but did not know the correct year but recalled the month but said it was  the 6th vs the 9th)              Exercises Total Joint Exercises Ankle Circles/Pumps: AROM;Both;20 reps (long sit) Quad Sets: Strengthening;Both;10 reps;Supine Knee Flexion: AROM;Left;Seated (10 min) Goniometric ROM: to 67 degrees flexion    General  Comments        Pertinent Vitals/Pain Pain Assessment: Faces Pain Score: 8  Faces Pain Scale: Hurts even more Pain Location: L knee Pain Descriptors / Indicators: Aching;Operative site guarding Pain Intervention(s): Monitored during session;Repositioned;Ice applied    Home Living                      Prior Function            PT Goals (current goals can now be found in the care plan section) Acute Rehab PT Goals Patient Stated Goal: to go home Progress towards PT goals: Progressing toward goals    Frequency    BID      PT Plan Current plan remains appropriate    Co-evaluation             End of Session Equipment Utilized During Treatment: Gait belt Activity Tolerance: Patient limited by pain;Patient limited by lethargy Patient left: with family/visitor present;with call bell/phone within reach;Other (comment);in bed;with SCD's reapplied (polar care; dtr with pt, alarm refused)     Time: 1350-1443 PT Time Calculation (min) (ACUTE ONLY): 53 min  Charges:  $Gait Training: 8-22 mins $Therapeutic Exercise: 8-22 mins $Therapeutic Activity: 8-22 mins                    G CodesLarae Grooms, PTA 03/20/2016, 2:58 PM

## 2016-03-20 NOTE — Progress Notes (Signed)
Occupational Therapy Treatment Patient Details Name: Travis [name] MRN: [id number] DOB: 1931/07/20 Today's Date: 03/20/2016    History of present illness Pt admitted for L TKR.    OT comments  Pt was sleepy but agreeable to therapy.  His daughter was in the room and stated that his pain meds were changed to try to increase alertness. He needed hand over hand to initiate use of reacher for LB dressing skills and mod assist to use sock aid.  Max cues to stay alert and focus on task.  Pt was not oriented to year or place when first asked but knew it was 2017 when given options to pick from.  He was able to recall that he had knee surgery.  Rec using a bread bag to help make donnong Ted hose easier and helped daughter find printing on Ted hose that he had on Large ted hose.  Also rec she freeze 6-8 water bottles at a time to use in the ice container instead of buying bags of ice. Spoke to [place] from case mgt about having OT HH as well as PT.  Discussed rec with pt and daughter who also agreed to rec.  Follow Up Recommendations  SNF (pt declined SNF and wants to go home--rec he stay on first floor not second for first  few weeks in case of emergency)    Equipment Recommendations   (reacher and sock aid )    Recommendations for Other Services      Precautions / Restrictions Precautions Precautions: Fall;Knee Required Braces or Orthoses: Knee Immobilizer - Left Knee Immobilizer - Left: On when out of bed or walking Restrictions Weight Bearing Restrictions: Yes LLE Weight Bearing: Weight bearing as tolerated       Mobility Bed Mobility Overal bed mobility: Needs Assistance Bed Mobility: Supine to Sit     Supine to sit: Mod assist     General bed mobility comments: Takes increased time; demonstrates partial bridge to edge of bed with increased instruction. Assist for LEs and trunk  Transfers Overall transfer level: Needs assistance Equipment used: Rolling walker (2  wheeled) Transfers: Sit to/from Stand Sit to Stand: Mod assist         General transfer comment: slow to rise and attain upright posture with increased verbal and tactile cueing; mildly retropulsive initially with inceased assist required until COG found    Balance Overall balance assessment: Needs assistance Sitting-balance support: No upper extremity supported;Feet supported Sitting balance-Leahy Scale: Good     Standing balance support: Bilateral upper extremity supported Standing balance-Leahy Scale: Fair                     ADL Overall ADL's : Needs assistance/impaired                                       General ADL Comments: Pt was sleepy but agreeable to therapy.  His daughter was in the room and stated that his pain meds were changed to try to increase alertness. He needed hand over hand to initiate use of reacher for LB dressing skills and mod assist to use sock aid.  Max cues to stay alert and focus on task.  Pt was not oriented to year or place when first asked but knew it was 2017 when given options to pick from.  He was able to recall that he had knee surgery.  Rec using a bread bag to help make donnong Ted hose easier and helped daughter find printing on Ted hose that he had on Large ted hose.  Also rec she freeze 6-8 water bottles at a time to use in the ice container instead of buying bags of ice.      Vision                     Perception     Praxis      Cognition   Behavior During Therapy: [facility] for tasks assessed/performed Overall Cognitive Status: Difficult to assess       Memory: Decreased short-term memory (Pt asked where he was but recalled hehad knee surgery but did not know the correct year but recalled the month but said it was the 6th vs the 9th)               Extremity/Trunk Assessment               Exercises Total Joint Exercises Ankle Circles/Pumps: AROM;Both;20 reps (long sit) Quad Sets:  Strengthening;Both;Other (comment) (12 reps long sit) Knee Flexion: AROM;Left;Seated (10 min) Goniometric ROM: 15-62   Shoulder Instructions       General Comments      Pertinent Vitals/ Pain       Pain Assessment: 0-10 Pain Score: 8  Pain Location: L knee Pain Descriptors / Indicators: Aching;Operative site guarding Pain Intervention(s): Limited activity within patient's tolerance;Monitored during session;Premedicated before session;Ice applied  Home Living                                          Prior Functioning/Environment              Frequency  Min 1X/week        Progress Toward Goals  OT Goals(current goals can now be found in the care plan section)  Progress towards OT goals: Progressing toward goals  Acute Rehab OT Goals Patient Stated Goal: to go home OT Goal Formulation: With patient/family Time For Goal Achievement: 04/02/16 Potential to Achieve Goals: Good  Plan Discharge plan needs to be updated    Co-evaluation                 End of Session     Activity Tolerance Patient limited by lethargy   Patient Left in chair;with call bell/phone within reach;with chair alarm set;with family/visitor present   Nurse Communication          Time: [id number] OT Time Calculation (min): 23 min  Charges: OT General Charges $OT Visit: 1 Procedure OT Treatments $Self Care/Home Management : 23-37 mins  Chrys Racer, OTR/L ascom [phone] 03/20/16, 12:51 PM

## 2016-03-21 MED ORDER — ENOXAPARIN SODIUM 40 MG/0.4ML ~~LOC~~ SOLN: 40.0000 mg | SUBCUTANEOUS | 0 refills | Status: DC

## 2016-03-21 MED ORDER — TRAMADOL HCL 50 MG PO TABS: 50.0000 mg | ORAL_TABLET | Freq: Four times a day (QID) | ORAL | 0 refills | Status: DC | PRN

## 2016-03-21 NOTE — Progress Notes (Signed)
Patient was discharged to [facility] via EMS. IV taken out with cath intact. New dressing and scripts sent in packet with EMS. Belongings sent with daughter. Called report. Allowed time for questions.

## 2016-03-21 NOTE — Progress Notes (Signed)
Physical Therapy Treatment Patient Details Name: Travis [name] MRN: [id number] DOB: Jan 03, 1932 Today's Date: 03/21/2016    History of Present Illness Pt admitted for L TKR.     PT Comments    Participated in exercises as described below.  Pt was able to transition to edge of bed with mod assist and ambulate 30' with min a x 2.  Pt with irregular step pattern with short steps.  Daughter in attendance and reports gait quality is improved since yesterday.  Some confusion noted during session but it did not interfere with progress.   Follow Up Recommendations  SNF;Other (comment)     Equipment Recommendations  Rolling walker with 5" wheels    Recommendations for Other Services       Precautions / Restrictions Precautions Precautions: Fall;Knee Precaution Booklet Issued: No Required Braces or Orthoses: Knee Immobilizer - Left Knee Immobilizer - Left: On when out of bed or walking Restrictions Weight Bearing Restrictions: Yes LLE Weight Bearing: Weight bearing as tolerated    Mobility  Bed Mobility Overal bed mobility: Needs Assistance Bed Mobility: Supine to Sit     Supine to sit: Mod assist        Transfers Overall transfer level: Needs assistance Equipment used: Rolling walker (2 wheeled) Transfers: Sit to/from Stand Sit to Stand: Mod assist;+2 physical assistance         General transfer comment: slow with poor hand placements  Ambulation/Gait Ambulation/Gait assistance: Min assist;+2 physical assistance Ambulation Distance (Feet): 30 Feet Assistive device: Rolling walker (2 wheeled) Gait Pattern/deviations: Step-to pattern;Decreased step length - right;Decreased step length - left;Wide base of support Gait velocity: slow Gait velocity interpretation: <1.8 ft/sec, indicative of risk for recurrent falls General Gait Details: KI donned.  short steps with irregular pattern, responds occasionally to vc's to inc step length   Stairs             Wheelchair Mobility    Modified Rankin (Stroke Patients Only)       Balance Overall balance assessment: Needs assistance Sitting-balance support: Feet supported Sitting balance-Leahy Scale: Fair     Standing balance support: Bilateral upper extremity supported Standing balance-Leahy Scale: Fair                      Cognition Arousal/Alertness: Lethargic;Suspect due to medications Behavior During Therapy: [facility] for tasks assessed/performed Overall Cognitive Status: Within Functional Limits for tasks assessed                      Exercises Total Joint Exercises Ankle Circles/Pumps: AROM;Both;10 reps;Supine Quad Sets: AROM;Both;10 reps;Supine Short Arc Quad: Right;10 reps;Supine;AAROM Heel Slides: AAROM;10 reps;Supine;Left Hip ABduction/ADduction: AAROM;10 reps;Supine;Left Straight Leg Raises: AAROM;10 reps;Supine;Left Long Arc Quad: AAROM;10 reps;Seated;Left Knee Flexion: AAROM;Left;10 reps;Seated Goniometric ROM: 5-82    General Comments        Pertinent Vitals/Pain Pain Assessment: 0-10 Pain Score: 5  Pain Location: L knee Pain Descriptors / Indicators: Aching;Operative site guarding Pain Intervention(s): Monitored during session;Ice applied    Home Living                      Prior Function            PT Goals (current goals can now be found in the care plan section) Progress towards PT goals: Progressing toward goals    Frequency    BID      PT Plan Current plan remains appropriate    Co-evaluation  End of Session Equipment Utilized During Treatment: Gait belt Activity Tolerance: Patient limited by pain;Patient limited by lethargy Patient left: in chair;with call bell/phone within reach;with chair alarm set;with family/visitor present     Time: 0912-0949 PT Time Calculation (min) (ACUTE ONLY): 37 min  Charges:  $Gait Training: 8-22 mins $Therapeutic Exercise: 8-22 mins                    G  Codes:      Chesley Noon 04/13/2016, 10:44 AM

## 2016-03-21 NOTE — Progress Notes (Addendum)
   Subjective: 3 Days Post-Op Procedure(s) (LRB): TOTAL KNEE ARTHROPLASTY (Left) Patient reports pain as 0/10 with rest. Moderate with movement to the left knee..   Patient is well, and has had no acute complaints or problems. Daughter states mental clarity improved with ultram. She states patient is back to his baseline. Denies any CP, SOB, ABD pain. We will continue therapy today.  Plan is to go Home after hospital stay.  Objective: Vital signs in last 24 hours: Temp:  [99.2 F (37.3 C)-99.6 F (37.6 C)] 99.5 F (37.5 C) (11/10 0417) Pulse Rate:  [71-77] 71 (11/10 0417) Resp:  [18] 18 (11/10 0417) BP: (102-121)/(65-74) 106/65 (11/10 0417) SpO2:  [92 %-96 %] 93 % (11/10 0417)  Intake/Output from previous day: 11/09 0701 - 11/10 0700 In: 720 [P.O.:720] Out: 425 [Urine:425] Intake/Output this shift: No intake/output data recorded.   Recent Labs  03/18/16 1105 03/19/16 0344 03/20/16 0426  HGB 13.4 11.7* 11.4*    Recent Labs  03/19/16 0344 03/20/16 0426  WBC 8.5 8.9  RBC 4.72 4.57  HCT 35.6* 34.0*  PLT 151 137*    Recent Labs  03/19/16 0344 03/20/16 0426  NA 134* 132*  K 3.9 3.8  CL 99* 97*  CO2 28 28  BUN 11 12  CREATININE 0.86 0.89  GLUCOSE 122* 113*  CALCIUM 8.6* 9.1   No results for input(s): LABPT, INR in the last 72 hours.  EXAM General - Patient is Alert, Appropriate and Oriented Extremity - Neurovascular intact Sensation intact distally Intact pulses distally Dorsiflexion/Plantar flexion intact No cellulitis present Compartment soft BLE Dressing - dressing C/D/I and no drainage,  Motor Function - intact, moving foot and toes well on exam.   Past Medical History:  Diagnosis Date  . Arthritis   . Hyperlipidemia   . Hypertension     Assessment/Plan:   3 Days Post-Op Procedure(s) (LRB): TOTAL KNEE ARTHROPLASTY (Left) Active Problems:   Primary osteoarthritis of left knee  Estimated body mass index is 29.31 kg/m as calculated from  the following:   Height as of this encounter: 5\' 9"  (1.753 m).   Weight as of this encounter: 90 kg (198 lb 8 oz). Advance diet Up with therapy  Acute post op blood loss anemia - Hgb stable Encourage incentive spirometer CM to assist with discharge. Patient and family prefer home with home with home health. Patient open to going to rehab. Discontinue oxycodone, continue with ultram  DVT Prophylaxis - Lovenox, Foot Pumps and TED hose Weight-Bearing as tolerated to left leg   T. Rachelle Hora, PA-C [place] 03/21/2016, 8:01 AM

## 2016-03-21 NOTE — Discharge Instructions (Signed)

## 2016-03-21 NOTE — Discharge Summary (Signed)
Physician Discharge Summary  Patient ID: Travis [name] MRN: [id number] DOB/AGE: 09-15-1931 82 y.o.  Admit date: 03/18/2016 Discharge date: 03/21/2016  Admission Diagnoses:  primary osteoarthritis   Discharge Diagnoses: Patient Active Problem List   Diagnosis Date Noted  . Primary osteoarthritis of left knee 03/18/2016  . Hernia of abdominal cavity   . B12 deficiency 10/23/2015  . Difficulty walking 10/16/2015  . Left knee pain 10/16/2015  . MCI (mild cognitive impairment) 10/16/2015  . Health care maintenance 01/23/2015  . BPH with obstruction/lower urinary tract symptoms 01/01/2014  . HTN (hypertension), benign 01/01/2014  . Hypercholesterolemia 01/01/2014  . OA (osteoarthritis) 01/01/2014    Past Medical History:  Diagnosis Date  . Arthritis   . Hyperlipidemia   . Hypertension      Transfusion: none   Consultants (if any):   Discharged Condition: Improved  Hospital Course: DUSAN LEFF is an 82 y.o. male who was admitted 03/18/2016 with a diagnosis of left knee osteoarthritis and went to the operating room on 03/18/2016 and underwent the above named procedures.    Surgeries: Procedure(s): TOTAL KNEE ARTHROPLASTY on 03/18/2016 Patient tolerated the surgery well. Taken to PACU where she was stabilized and then transferred to the orthopedic floor.  Started on Lovenox 30 q 12 hrs. Foot pumps applied bilaterally at 80 mm. Heels elevated on bed with rolled towels. No evidence of DVT. Negative Homan. Physical therapy started on day #1 for gait training and transfer. OT started day #1 for ADL and assisted devices.  Patient's foley was d/c on day #1. Patient's IV and hemovac was d/c on day #2.  On post op day #3 patient was stable and ready for discharge SNF.  Implants: Medacta GMK sphere left 7 femur 6 tibia with short stem and 10 mm insert, 4 patella, all components cemented   He was given perioperative antibiotics:  Anti-infectives    Start     Dose/Rate Route  Frequency Ordered Stop   03/18/16 1200  ceFAZolin (ANCEF) IVPB 2g/100 mL premix     2 g 200 mL/hr over 30 Minutes Intravenous Every 6 hours 03/18/16 1051 03/18/16 2343   03/18/16 0551  ceFAZolin (ANCEF) 2-4 GM/100ML-% IVPB    Comments:  Ronnell Freshwater: cabinet override      03/18/16 0551 03/18/16 0747   03/18/16 0300  ceFAZolin (ANCEF) IVPB 2g/100 mL premix     2 g 200 mL/hr over 30 Minutes Intravenous  Once 03/18/16 0256 03/18/16 0757    .  He was given sequential compression devices, early ambulation, and Lovenox  for DVT prophylaxis.  He benefited maximally from the hospital stay and there were no complications.    Recent vital signs:  Vitals:   03/20/16 2042 03/21/16 0417  BP: 121/68 106/65  Pulse: 74 71  Resp: 18 18  Temp: 99.6 F (37.6 C) 99.5 F (37.5 C)    Recent laboratory studies:  Lab Results  Component Value Date   HGB 11.4 (L) 03/20/2016   HGB 11.7 (L) 03/19/2016   HGB 13.4 03/18/2016   Lab Results  Component Value Date   WBC 8.9 03/20/2016   PLT 137 (L) 03/20/2016   Lab Results  Component Value Date   INR 0.99 03/05/2016   Lab Results  Component Value Date   NA 132 (L) 03/20/2016   K 3.8 03/20/2016   CL 97 (L) 03/20/2016   CO2 28 03/20/2016   BUN 12 03/20/2016   CREATININE 0.89 03/20/2016   GLUCOSE 113 (H) 03/20/2016  Discharge Medications:     Medication List    STOP taking these medications   HYDROcodone-acetaminophen 5-325 MG tablet Commonly known as:  NORCO/VICODIN     TAKE these medications   amLODipine 5 MG tablet Commonly known as:  NORVASC Take 5 mg by mouth every evening.   aspirin EC 81 MG tablet Take 81 mg by mouth daily.   enoxaparin 40 MG/0.4ML injection Commonly known as:  LOVENOX Inject 0.4 mLs (40 mg total) into the skin daily.   pravastatin 40 MG tablet Commonly known as:  PRAVACHOL Take 40 mg by mouth every evening.   RA VITAMIN B-12 TR 1000 MCG Tbcr Generic drug:  Cyanocobalamin Take 1,000 mcg by  mouth daily.   traMADol 50 MG tablet Commonly known as:  ULTRAM Take 1-2 tablets (50-100 mg total) by mouth every 6 (six) hours as needed for moderate pain or severe pain.            Durable Medical Equipment        Start     Ordered   03/18/16 1052  DME Walker rolling  Once     03/18/16 1051   03/18/16 1052  DME 3 n 1  Once     03/18/16 1051   03/18/16 1052  DME Bedside commode  Once     03/18/16 1051      Diagnostic Studies: Dg Knee 1-2 Views Left  Result Date: 03/18/2016 CLINICAL DATA:  Post- operative images following left total knee joint replacement. EXAM: LEFT KNEE - 1-2 VIEW COMPARISON:  CT scan of the knee of February 05, 2016 FINDINGS: The prosthetic components appear appropriately positioned with respect to the native bone. The interface appears normal. No acute native bone abnormality is observed. There is air within the anterior soft tissues. Surgical skin staples are present. IMPRESSION: There is no acute postprocedure complication following left total knee joint replacement. Electronically Signed   By: David  Martinique M.D.   On: 03/18/2016 10:10    Disposition: 01-Home or Self Care     Contact information for follow-up providers    MENZ,MICHAEL, MD Follow up.   Specialty:  Orthopedic Surgery Why:  2 weeks for staple removal Contact information: [place] [address] [phone]            Contact information for after-discharge care    Destination    [facility] Follow up.   Specialty:  [place] information: [address] [place] [phone]                   Signed: Feliberto Gottron 03/21/2016, 8:19 AM

## 2016-03-21 NOTE — Progress Notes (Signed)
Patient is medically stable for D/C to [facility] today. Per [facility] liaison patient will go to room 607. RN will call report to RN Yaakov Guthrie at [phone] and arrange EMS for transport. Clinical Education officer, museum (CSW) sent D/C orders to [facility] [employer] via [facility] today. Patient is aware of above. Patient's daughter Vanessa Barbara is at bedside and aware of D/C today. Please reconsult if future social work needs arise. CSW signing off.   [employer], LCSW [phone]

## 2016-03-21 NOTE — Clinical Social Work Placement (Signed)
   CLINICAL SOCIAL WORK PLACEMENT  NOTE  Date:  03/21/2016  Patient Details  Name: Travis [name] MRN: [id number] Date of Birth: 05-21-1931  Clinical Social Work is seeking post-discharge placement for this patient at the [place] level of care (*CSW will initial, date and re-position this form in  chart as items are completed):  Yes   Patient/family provided with [facility] Work Department's list of facilities offering this level of care within the geographic area requested by the patient (or if unable, by the patient's family).  Yes   Patient/family informed of their freedom to choose among providers that offer the needed level of care, that participate in Medicare, Medicaid or managed care program needed by the patient, have an available bed and are willing to accept the patient.  Yes   Patient/family informed of [facility]'s ownership interest in [facility] and [facility], as well as of the fact that they are under no obligation to receive care at these facilities.  PASRR submitted to EDS on 03/18/16     PASRR number received on 03/18/16     Existing PASRR number confirmed on       FL2 transmitted to all facilities in geographic area requested by pt/family on 03/19/16     FL2 transmitted to all facilities within larger geographic area on       Patient informed that his/her managed care company has contracts with or will negotiate with certain facilities, including the following:        Yes   Patient/family informed of bed offers received.  Patient chooses bed at  ([facility] )     Physician recommends and patient chooses bed at      Patient to be transferred to  ([facility] ) on 03/21/16.  Patient to be transferred to facility by  [facility] )     Patient family notified on 03/21/16 of transfer.  Name of family member notified:   (Patient's daughter Travis [name] is at bedside and aware of D/C today. )     PHYSICIAN        Additional Comment:    _______________________________________________ Jasmarie Coppock, Veronia Beets, LCSW 03/21/2016, 10:41 AM

## 2016-03-21 NOTE — Progress Notes (Signed)
Clinical Education officer, museum (CSW) met with patient and his daughter this morning to confirm D/C plan. Patient and daughter are agreeable for patient to go to [facility] today. Per daughter patient has already had 2 BM's post op. CSW paged PA to make him aware of above.   [employer], LCSW [phone]

## 2016-03-21 NOTE — Progress Notes (Signed)
Spoke with Travis [name] , [facility] rep at [phone], to notify of non-emergent EMS transport.  Auth notification reference given as [id number].   Service date range good for 90 days starting from 03/21/16.   Geographical gap exception requested to determine if services can be considered at an in-network level.

## 2016-03-21 NOTE — Care Management (Signed)
[facility] notified that patient is going to [facility]

## 2016-04-29 DIAGNOSIS — G250 Essential tremor: Secondary | ICD-10-CM | POA: Insufficient documentation

## 2016-08-20 ENCOUNTER — Encounter: Payer: Self-pay | Admitting: Urology

## 2016-08-20 ENCOUNTER — Ambulatory Visit (INDEPENDENT_AMBULATORY_CARE_PROVIDER_SITE_OTHER): Payer: Medicare Other | Admitting: Urology

## 2016-08-20 VITALS — BP 129/79 | HR 53 | Ht 69.0 in | Wt 197.4 lb

## 2016-08-20 DIAGNOSIS — N400 Enlarged prostate without lower urinary tract symptoms: Secondary | ICD-10-CM

## 2016-08-20 DIAGNOSIS — R3129 Other microscopic hematuria: Secondary | ICD-10-CM

## 2016-08-20 DIAGNOSIS — R3912 Poor urinary stream: Secondary | ICD-10-CM

## 2016-08-20 LAB — MICROSCOPIC EXAMINATION: Epithelial Cells (non renal): NONE SEEN /hpf (ref 0–10)

## 2016-08-20 LAB — URINALYSIS, COMPLETE
Bilirubin, UA: NEGATIVE
Glucose, UA: NEGATIVE
Ketones, UA: NEGATIVE
Leukocytes, UA: NEGATIVE
Nitrite, UA: NEGATIVE
Specific Gravity, UA: 1.02 (ref 1.005–1.030)
Urobilinogen, Ur: 1 mg/dL (ref 0.2–1.0)
pH, UA: 7 (ref 5.0–7.5)

## 2016-08-20 NOTE — Progress Notes (Signed)
08/20/2016 9:54 AM   Travis [name] 02/17/1932 [id number]  Referring provider: Kirk Ruths, MD [facility] [place], [place] [address]  Chief Complaint  Patient presents with  . New Patient (Initial Visit)    gross hematuria    HPI: 83 year old referred for gross hematuria. 08/13/2016 patient noticed pink tinged urine. UA showed greater than 50 red blood cells per high-powered field. Urine culture was next. He had a CT scan in 2013 with prostate enlargement noted (~100g). Urine cx mixed. No flank pain or stone passage. No smoking, radiation, chemo or dye/chemical exposure. No dysuria. Per pt, hematuria as resolved and his urine looks clear.   Prostate ~100g on 2013 CT. PSA was 2.85 in 2015. He has a weak stream, but adequate. He feels empty. No straining. He has urgency at times.   UA today with 3-10 rbc, few bacteria. N -, LE -.    PMH: Past Medical History:  Diagnosis Date  . Arthritis   . Hyperlipidemia   . Hypertension     Surgical History: Past Surgical History:  Procedure Laterality Date  . eye implants  1992  . EYE SURGERY Bilateral    Cataract Extraction with IOL Implants  . HERNIA REPAIR Right 1992   Inguinal Hernia Repair  . INGUINAL HERNIA REPAIR Left 01/03/2016   Procedure: LAPAROSCOPIC INGUINAL HERNIA;  Surgeon: Jules Husbands, MD;  Location: [facility] ORS;  Service: General;  Laterality: Left;  . TOTAL KNEE ARTHROPLASTY Left 03/18/2016   Procedure: TOTAL KNEE ARTHROPLASTY;  Surgeon: Hessie Knows, MD;  Location: [facility] ORS;  Service: Orthopedics;  Laterality: Left;    Home Medications:  Allergies as of 08/20/2016   No Known Allergies     Medication List       Accurate as of 08/20/16  9:54 AM. Always use your most recent med list.          amLODipine 5 MG tablet Commonly known as:  NORVASC Take 5 mg by mouth every evening.   aspirin EC 81 MG tablet Take 81 mg by mouth daily.   enoxaparin 40 MG/0.4ML  injection Commonly known as:  LOVENOX Inject 0.4 mLs (40 mg total) into the skin daily.   pravastatin 40 MG tablet Commonly known as:  PRAVACHOL Take 40 mg by mouth every evening.   RA VITAMIN B-12 TR 1000 MCG Tbcr Generic drug:  Cyanocobalamin Take 1,000 mcg by mouth daily.   traMADol 50 MG tablet Commonly known as:  ULTRAM Take 1-2 tablets (50-100 mg total) by mouth every 6 (six) hours as needed for moderate pain or severe pain.       Allergies: No Known Allergies  Family History: Family History  Problem Relation Age of Onset  . Stroke Father   . Diabetes Sister   . Breast cancer Sister   . Cancer Maternal Aunt   . Hematuria Neg Hx   . Renal cancer Neg Hx   . Prostate cancer Neg Hx     Social History:  reports that he has never smoked. He has never used smokeless tobacco. He reports that he does not drink alcohol or use drugs.  ROS: UROLOGY Frequent Urination?: Yes Hard to postpone urination?: No Burning/pain with urination?: No Get up at night to urinate?: No Leakage of urine?: No Urine stream starts and stops?: No Trouble starting stream?: No Do you have to strain to urinate?: No Blood in urine?: Yes Urinary tract infection?: No Sexually transmitted disease?: No Injury to  kidneys or bladder?: No Painful intercourse?: No Weak stream?: Yes Erection problems?: Yes Penile pain?: No  Gastrointestinal Nausea?: No Vomiting?: No Indigestion/heartburn?: No Diarrhea?: No Constipation?: No  Constitutional Fever: No Night sweats?: No Weight loss?: No Fatigue?: Yes  Skin Skin rash/lesions?: No Itching?: No  Eyes Blurred vision?: No Double vision?: No  Ears/Nose/Throat Sore throat?: No Sinus problems?: No  Hematologic/Lymphatic Swollen glands?: No Easy bruising?: No  Cardiovascular Leg swelling?: No Chest pain?: No  Respiratory Cough?: No Shortness of breath?: No  Endocrine Excessive thirst?: No  Musculoskeletal Back pain?:  No Joint pain?: No  Neurological Headaches?: No Dizziness?: No  Psychologic Depression?: No Anxiety?: No  Physical Exam: BP 129/79   Pulse (!) 53   Ht 5\' 9"  (1.753 m)   Wt 89.5 kg (197 lb 6.4 oz)   BMI 29.15 kg/m   Constitutional:  Alert and oriented, No acute distress. HEENT: [place] AT, moist mucus membranes.  Trachea midline, no masses. Cardiovascular: No clubbing, cyanosis, or edema. Respiratory: Normal respiratory effort, no increased work of breathing. GI: Abdomen is soft, nontender, nondistended, no abdominal masses GU: No CVA tenderness. Penis - uncircumcised, normal foreskin, no mass Scrotum - normal. Testicles descended bilaterally and palpably normal  DRE - prostate about 50 grams, smooth, no hard area or nodule Skin: No rashes, bruises or suspicious lesions. Lymph: No cervical or inguinal adenopathy. Neurologic: Grossly intact, no focal deficits, moving all 4 extremities. Psychiatric: Normal mood and affect.  Laboratory Data: Lab Results  Component Value Date   WBC 8.9 03/20/2016   HGB 11.4 (L) 03/20/2016   HCT 34.0 (L) 03/20/2016   MCV 74.5 (L) 03/20/2016   PLT 137 (L) 03/20/2016    Lab Results  Component Value Date   CREATININE 0.89 03/20/2016    No results found for: PSA  No results found for: TESTOSTERONE  No results found for: HGBA1C  Urinalysis    Component Value Date/Time   COLORURINE STRAW (A) 03/05/2016 1417   APPEARANCEUR CLEAR (A) 03/05/2016 1417   LABSPEC 1.003 (L) 03/05/2016 1417   PHURINE 6.0 03/05/2016 1417   GLUCOSEU NEGATIVE 03/05/2016 1417   HGBUR 2+ (A) 03/05/2016 1417   BILIRUBINUR NEGATIVE 03/05/2016 1417   KETONESUR NEGATIVE 03/05/2016 1417   PROTEINUR NEGATIVE 03/05/2016 1417   NITRITE NEGATIVE 03/05/2016 [place] 03/05/2016 1417    Pertinent imaging - CT reviewed   Assessment & Plan:   1. gross hematuria  -- discussed with patient and daughter nature of gross hematuria and the nature, r/b/a to  CT and cystoscopy. They elect to proceed. I recommended these tests.  - Urinalysis, Complete  2. BPH, weak stream - no bothersome LUTS. Check PVR on return.   No Follow-up on file.  Festus Aloe, [facility] [address], [place],  [address] [phone]

## 2016-09-01 ENCOUNTER — Ambulatory Visit
Admission: RE | Admit: 2016-09-01 | Discharge: 2016-09-01 | Disposition: A | Discharge status: Home / Self Care | Payer: Medicare Other | Source: Ambulatory Visit | Attending: Urology | Admitting: Urology

## 2016-09-01 DIAGNOSIS — N329 Bladder disorder, unspecified: Secondary | ICD-10-CM | POA: Diagnosis not present

## 2016-09-01 DIAGNOSIS — R3129 Other microscopic hematuria: Secondary | ICD-10-CM | POA: Diagnosis not present

## 2016-09-01 LAB — POCT I-STAT CREATININE: Creatinine, Ser: 1 mg/dL (ref 0.61–1.24)

## 2016-09-01 MED ORDER — IOPAMIDOL (ISOVUE-300) INJECTION 61%
125.0000 mL | Freq: Once | INTRAVENOUS | Status: AC | PRN
  Administered 2016-09-01: 125 mL via INTRAVENOUS

## 2016-09-03 ENCOUNTER — Telehealth: Payer: Self-pay

## 2016-09-03 NOTE — Telephone Encounter (Signed)
Travis Aloe, MD  Lestine Box, LPN        Notify patient/daughter there is an irregular area on the right side of the patient's bladder that looks like a polyp or growth. Important to f/u for cystoscopy as planned.    Spoke with pt daughter in reference to irregular area on pt bladder. Reinforced the importance of keeping cysto appt. Daughter voiced understanding.

## 2016-09-04 ENCOUNTER — Ambulatory Visit (INDEPENDENT_AMBULATORY_CARE_PROVIDER_SITE_OTHER): Payer: Medicare Other | Admitting: Urology

## 2016-09-04 ENCOUNTER — Encounter: Payer: Self-pay | Admitting: Urology

## 2016-09-04 VITALS — BP 122/69 | HR 58 | Ht 69.0 in | Wt 199.2 lb

## 2016-09-04 DIAGNOSIS — R310 Gross hematuria: Secondary | ICD-10-CM

## 2016-09-04 DIAGNOSIS — D494 Neoplasm of unspecified behavior of bladder: Secondary | ICD-10-CM

## 2016-09-04 LAB — URINALYSIS, COMPLETE
Bilirubin, UA: NEGATIVE
Glucose, UA: NEGATIVE
Ketones, UA: NEGATIVE
Leukocytes, UA: NEGATIVE
Nitrite, UA: NEGATIVE
Specific Gravity, UA: 1.02 (ref 1.005–1.030)
Urobilinogen, Ur: 1 mg/dL (ref 0.2–1.0)
pH, UA: 6 (ref 5.0–7.5)

## 2016-09-04 LAB — MICROSCOPIC EXAMINATION: RBC, UA: >30 /hpf — ABNORMAL HIGH (ref 0–?)

## 2016-09-04 MED ORDER — CIPROFLOXACIN HCL 500 MG PO TABS
500.0000 mg | ORAL_TABLET | Freq: Once | ORAL | Status: AC
  Administered 2016-09-04: 500 mg via ORAL

## 2016-09-04 MED ORDER — LIDOCAINE HCL 2 % EX GEL
1.0000 "application " | Freq: Once | CUTANEOUS | Status: AC
  Administered 2016-09-04: 1 via URETHRAL

## 2016-09-04 NOTE — Progress Notes (Signed)
   09/04/16  CC:  Chief Complaint  Patient presents with  . Cysto    gross hematuria    HPI: The patient is an 83 year old gentleman who presents for completion of his gross hematuria workup. 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 He presents today for cystoscopy.  Blood pressure 122/69, pulse (!) 58, height 5\' 9"  (1.753 m), weight 199 lb 3.2 oz (90.4 kg). NED. A&Ox3.   No respiratory distress   Abd soft, NT, ND Normal phallus with bilateral descended testicles  Cystoscopy Procedure Note  Patient identification was confirmed, informed consent was obtained, and patient was prepped using Betadine solution.  Lidocaine jelly was administered per urethral meatus.    Preoperative abx where received prior to procedure.     Pre-Procedure: - Inspection reveals a normal caliber ureteral meatus.  Procedure: The flexible cystoscope was introduced without difficulty - No urethral strictures/lesions are present. - Enlarged prostate 6 cm prostatic urethral. Visually obstructive. 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 No other tumors seen. - No bladder stones - No trabeculation  Retroflexion shows no intravesical lobe   Post-Procedure: - Patient tolerated the procedure well  Assessment/ Plan:  1. Bladder tumor 2. Gross hematuria I discussed with the patient and his daughter his bladder tumor that his bladder cancer until proven otherwise. We discussed the next step would be a TURBT. We discussed the risks, benefits, indications of this procedure. Our discussion included but was not limited to the risk of bleeding, infection, need for repeat procedure, need for Foley catheter, need for hospitalization, and bladder perforation. He also is at risk for needing a right ureteral stent in the procedure due to its proximity to the UVJ.  The patient is  anxious and does not want to undergo general anesthesia due to poor the tolerating in the past. He is requesting a spinal for this procedure. I think this is reasonable, we will schedule his procedure with spinal.

## 2016-09-06 LAB — CULTURE, URINE COMPREHENSIVE

## 2016-09-08 ENCOUNTER — Other Ambulatory Visit: Payer: Self-pay | Admitting: Radiology

## 2016-09-08 ENCOUNTER — Telehealth: Payer: Self-pay | Admitting: Radiology

## 2016-09-08 DIAGNOSIS — D494 Neoplasm of unspecified behavior of bladder: Secondary | ICD-10-CM

## 2016-09-08 NOTE — Telephone Encounter (Signed)
Notified daughter, Vanessa Barbara, of surgery scheduled with Dr Pilar Jarvis on 09/19/16, pre-admit testing appt & to call day prior to surgery for arrival time to SDS. Will notify pt when to hold ASA 81mg  once clearance is obtained from Dr Caryl Comes. [name] voices understanding.

## 2016-09-11 ENCOUNTER — Encounter: Payer: Self-pay | Admitting: Internal Medicine

## 2016-09-11 ENCOUNTER — Ambulatory Visit (INDEPENDENT_AMBULATORY_CARE_PROVIDER_SITE_OTHER): Payer: Medicare Other | Admitting: Internal Medicine

## 2016-09-11 VITALS — BP 126/74 | HR 63 | Ht 69.0 in | Wt 198.0 lb

## 2016-09-11 DIAGNOSIS — R001 Bradycardia, unspecified: Secondary | ICD-10-CM | POA: Diagnosis not present

## 2016-09-11 NOTE — Progress Notes (Signed)
ELECTROPHYSIOLOGY CONSULT NOTE  Patient ID: ANTHON [name], MRN: [id number], DOB/AGE: April 04, 1932 83 y.o. Admit date: (Not on file) Date of Consult: 09/11/2016  Primary Physician: Kirk Ruths., MD Primary Cardiologist: new   [name] is being seen today for the evaluation of brqadycardia at the request of  Kirk Ruths, MD.   HPI Travis [name] is a 83 y.o. male referred because of bradycardia. He says that he has been told his whole life long that he has "an athlete's heart".  He is extremely active. [place]. He has no limitations in his daily activities although he does note some slowing down over the last decade or 2.  He denies shortness of breath or chest pain. He's had no peripheral edema.  His had no lightheadedness or syncope.  He's had no palpitations.  Since having been told his heart rate was slow, he discontinued his amlodipine on his own  Past Medical History:  Diagnosis Date  . Arthritis   . Hyperlipidemia   . Hypertension       Surgical History:  Past Surgical History:  Procedure Laterality Date  . eye implants  1992  . EYE SURGERY Bilateral    Cataract Extraction with IOL Implants  . HERNIA REPAIR Right 1992   Inguinal Hernia Repair  . INGUINAL HERNIA REPAIR Left 01/03/2016   Procedure: LAPAROSCOPIC INGUINAL HERNIA;  Surgeon: Jules Husbands, MD;  Location: [facility] ORS;  Service: General;  Laterality: Left;  . TOTAL KNEE ARTHROPLASTY Left 03/18/2016   Procedure: TOTAL KNEE ARTHROPLASTY;  Surgeon: Hessie Knows, MD;  Location: [facility] ORS;  Service: Orthopedics;  Laterality: Left;     Home Meds: Prior to Admission medications   Medication Sig Start Date End Date Taking? Authorizing Provider  acetaminophen (TYLENOL) 650 MG CR tablet Take 1,300 mg by mouth daily as needed for pain.   Yes Historical Provider, MD  amLODipine (NORVASC) 5 MG tablet Take 5 mg by mouth every evening.  01/23/15  Yes Historical  Provider, MD  aspirin EC 81 MG tablet Take 81 mg by mouth every evening.    Yes Historical Provider, MD  Melatonin 10 MG TABS Take 10 mg by mouth daily as needed (sleep).   Yes Historical Provider, MD  pravastatin (PRAVACHOL) 40 MG tablet Take 40 mg by mouth every evening.  01/23/15  Yes Historical Provider, MD    Allergies: No Known Allergies  Social History   Social History  . Marital status: Widowed    Spouse name: N/A  . Number of children: N/A  . Years of education: N/A   Occupational History  . Not on file.   Social History Main Topics  . Smoking status: Never Smoker  . Smokeless tobacco: Never Used  . Alcohol use No  . Drug use: No  . Sexual activity: Not on file   Other Topics Concern  . Not on file   Social History Narrative  . No narrative on file     Family History  Problem Relation Age of Onset  . Stroke Father   . Diabetes Sister   . Breast cancer Sister   . Cancer Maternal Aunt   . Hematuria Neg Hx   . Renal cancer Neg Hx   . Prostate cancer Neg Hx      ROS:  Please see the history of present illness.     All other systems reviewed and negative.    Physical Exam:  Blood pressure 126/74, pulse 63, height 5\' 9"  (1.753 m), weight 198 lb (89.8 kg), SpO2 99 %. General: Well developed, well nourished male in no acute distress. Head: Normocephalic, atraumatic, sclera non-icteric, no xanthomas, nares are without discharge. EENT: normal  Lymph Nodes:  none Neck: Negative for carotid bruits. JVD not elevated. Back:without scoliosis kyphosis  Lungs: Clear bilaterally to auscultation without wheezes, rales, or rhonchi. Breathing is unlabored. Heart: RRR with loud S1 S2.  2/6 systolic  murmur . No rubs, or gallops appreciated. Abdomen: Soft, non-tender, non-distended with normoactive bowel sounds. No hepatomegaly. No rebound/guarding. No obvious abdominal masses. Msk:  Strength and tone appear normal for age. Extremities: No clubbing or cyanosis. No  edema.   Distal pedal pulses are 2+ and equal bilaterally. Skin: Warm and Dry Neuro: Alert and oriented X 3. CN III-XII intact Grossly normal sensory and motor function . Psych:  Responds to questions appropriately with a normal affect.      Labs: Cardiac Enzymes No results for input(s): CKTOTAL, CKMB, TROPONINI in the last 72 hours. CBC Lab Results  Component Value Date   WBC 8.9 03/20/2016   HGB 11.4 (L) 03/20/2016   HCT 34.0 (L) 03/20/2016   MCV 74.5 (L) 03/20/2016   PLT 137 (L) 03/20/2016   PROTIME: No results for input(s): LABPROT, INR in the last 72 hours. Chemistry No results for input(s): NA, K, CL, CO2, BUN, CREATININE, CALCIUM, PROT, BILITOT, ALKPHOS, ALT, AST, GLUCOSE in the last 168 hours.  Invalid input(s): LABALBU Lipids No results found for: CHOL, HDL, LDLCALC, TRIG BNP No results found for: PROBNP Thyroid Function Tests: No results for input(s): TSH, T4TOTAL, T3FREE, THYROIDAB in the last 72 hours.  Invalid input(s): FREET3 Miscellaneous No results found for: DDIMER  Radiology/Studies:  Ct Abdomen Pelvis W Wo Contrast  Result Date: 09/01/2016 CLINICAL DATA:  Painless hematuria 3 weeks prior. EXAM: CT ABDOMEN AND PELVIS WITHOUT AND WITH CONTRAST TECHNIQUE: Multidetector CT imaging of the abdomen and pelvis was performed following the standard protocol before and following the bolus administration of intravenous contrast. CONTRAST:  160mL ISOVUE-300 IOPAMIDOL (ISOVUE-300) INJECTION 61% COMPARISON:  CT 06/10/2011 with FINDINGS: Lower chest: Mild atelectasis the lung bases.  No nodularity Hepatobiliary: Several tiny hypodensities in the liver appear benign. Normal gallbladder. Pancreas: Pancreas is normal. No ductal dilatation. No pancreatic inflammation. Spleen: Normal spleen Adrenals/urinary tract: Adrenal glands are normal. No nephrolithiasis or ureterolithiasis. No enhancing renal cortical lesion. No filling defect within the collecting systems or ureters. Proximal  ureters. There is a lobular mass along the posterior RIGHT aspect of the bladder in the vicinity of the RIGHT vesicoureteral junction. This lobular lesion has frondlike projections measuring 2.3 x 2.5 cm (image 76, series 4). There several speckled calcifications within this lesion. Lesion demonstrates mild post-contrast enhancement. Stomach/Bowel: Stomach, small bowel, appendix, and cecum are normal. The colon and rectosigmoid colon are normal. Vascular/Lymphatic: Abdominal aorta is normal caliber. There is no retroperitoneal or periportal lymphadenopathy. No pelvic lymphadenopathy. Reproductive: Prostate normal. Other: No free fluid. Musculoskeletal: Sclerotic lesion in the LEFT iliac bone measuring 5 mm this not changed from prior. IMPRESSION: 1. Lobular enhancing mass within the posterior RIGHT aspect of the bladder is concerning for bladder carcinoma or uroepithelial carcinoma. Lesion at the RIGHT vesicoureteral junction. No evidence obstruction. 2. No renal lesion. These results will be called to the ordering clinician or representative by the Radiologist Assistant, and communication documented in the PACS or zVision Dashboard. Electronically Signed   By: Helane Gunther.D.  On: 09/01/2016 16:14    EKG:  Sinus 51 17/09/39 -49   Assessment and Plan:  Sinus bradycardia  Hypertension   The patient has asymptomatic bradycardia. There is no indication for intervention. He has any interim also stop his amlodipine; this turned out to be a reasonable decision for 2 reasons. The first is rarely amlodipine can be associated with sinus bradycardia.   The other is that his blood pressure today is normal suggesting that he may not need antihypertensive therapy.  He will follow-up with the settings with Dr. Rexene Alberts

## 2016-09-11 NOTE — Patient Instructions (Signed)
Medication Instructions: Your physician recommends that you continue on your current medications as directed. Please refer to the Current Medication list given to you today.  Labwork: None Ordered  Procedures/Testing: None Ordered  Follow-Up: Your physician recommends that you schedule a follow-up appointment as needed with Dr. Klein.   Any Additional Special Instructions Will Be Listed Below (If Applicable).     If you need a refill on your cardiac medications before your next appointment, please call your pharmacy.   

## 2016-09-12 ENCOUNTER — Encounter
Admission: RE | Admit: 2016-09-12 | Discharge: 2016-09-12 | Disposition: A | Discharge status: Home / Self Care | Payer: Medicare Other | Source: Ambulatory Visit | Attending: Urology | Admitting: Urology

## 2016-09-12 ENCOUNTER — Telehealth: Payer: Self-pay | Admitting: Internal Medicine

## 2016-09-12 DIAGNOSIS — D494 Neoplasm of unspecified behavior of bladder: Secondary | ICD-10-CM | POA: Insufficient documentation

## 2016-09-12 DIAGNOSIS — Z01818 Encounter for other preprocedural examination: Secondary | ICD-10-CM | POA: Insufficient documentation

## 2016-09-12 HISTORY — DX: Adverse effect of unspecified anesthetic, initial encounter: T41.45XA

## 2016-09-12 HISTORY — DX: Other complications of anesthesia, initial encounter: T88.59XA

## 2016-09-12 LAB — BASIC METABOLIC PANEL
Anion gap: 8 (ref 5–15)
BUN: 17 mg/dL (ref 6–20)
CO2: 29 mmol/L (ref 22–32)
Calcium: 9.6 mg/dL (ref 8.9–10.3)
Chloride: 100 mmol/L — ABNORMAL LOW (ref 101–111)
Creatinine, Ser: 1.01 mg/dL (ref 0.61–1.24)
GFR calc Af Amer: >60 mL/min (ref >60)
GFR calc non Af Amer: >60 mL/min (ref >60)
Glucose, Bld: 83 mg/dL (ref 65–99)
Potassium: 4.1 mmol/L (ref 3.5–5.1)
Sodium: 137 mmol/L (ref 135–145)

## 2016-09-12 LAB — APTT: aPTT: 32 seconds (ref 24–36)

## 2016-09-12 LAB — CBC
HCT: 42.5 % (ref 40.0–52.0)
Hemoglobin: 13.4 g/dL (ref 13.0–18.0)
MCH: 23 pg — ABNORMAL LOW (ref 26.0–34.0)
MCHC: 31.5 g/dL — ABNORMAL LOW (ref 32.0–36.0)
MCV: 73.1 fL — ABNORMAL LOW (ref 80.0–100.0)
Platelets: 207 10*3/uL (ref 150–440)
RBC: 5.81 MIL/uL (ref 4.40–5.90)
RDW: 17.1 % — ABNORMAL HIGH (ref 11.5–14.5)
WBC: 4.3 10*3/uL (ref 3.8–10.6)

## 2016-09-12 LAB — PROTIME-INR
INR: 0.98
Prothrombin Time: 13 seconds (ref 11.4–15.2)

## 2016-09-12 NOTE — Pre-Procedure Instructions (Signed)
ECG 12-lead4/02/2017 [place] Component Name Value Ref Range  Vent Rate (bpm) 48   PR Interval (msec) 164   QRS Interval (msec) 90   QT Interval (msec) 404   QTc (msec) 360   Result Narrative  Sinus bradycardia Otherwise normal ECG No previous ECGs available I reviewed and concur with this report. Electronically signed [id number] MD, MARSHALL ([id number]) on 09/01/2016 9:18:25 AM

## 2016-09-12 NOTE — Patient Instructions (Signed)
Your procedure is scheduled on: Friday 09/19/16 Report to [place]. 2ND FLOOR MEDICAL MALL ENTRANCE. To find out your arrival time please call [phone] between 1PM - 3PM on Thursday 09/18/16.  Remember: Instructions that are not followed completely may result in serious medical risk, up to and including death, or upon the discretion of your surgeon and anesthesiologist your surgery may need to be rescheduled.    __X__ 1. Do not eat food or drink liquids after midnight. No gum chewing or hard candies.     __X__ 2. No Alcohol for 24 hours before or after surgery.   ____ 3. Bring all medications with you on the day of surgery if instructed.    __X__ 4. Notify your doctor if there is any change in your medical condition     (cold, fever, infections).             __X___5. No smoking within 24 hours of your surgery.     Do not wear jewelry, make-up, hairpins, clips or nail polish.  Do not wear lotions, powders, or perfumes.   Do not shave 48 hours prior to surgery. Men may shave face and neck.  Do not bring valuables to the hospital.    [facility] is not responsible for any belongings or valuables.               Contacts, dentures or bridgework may not be worn into surgery.  Leave your suitcase in the car. After surgery it may be brought to your room.  For patients admitted to the hospital, discharge time is determined by your                treatment team.   Patients discharged the day of surgery will not be allowed to drive home.   Please read over the following fact sheets that you were given:   MRSA Information   ____ Take these medicines the morning of surgery with A SIP OF WATER:    1. N0NE  2.   3.   4.  5.  6.  ____ Fleet Enema (as directed)   ____ Use CHG Soap as directed  ____ Use inhalers on the day of surgery  ____ Stop metformin 2 days prior to surgery    ____ Take 1/2 of usual insulin dose the night before surgery and none on the morning of surgery.    __X__ Stop Coumadin/Plavix/aspirin on ALREADY STOPPED  __X__ Stop Anti-inflammatories such as Advil, Aleve, Ibuprofen, Motrin, Naproxen, Naprosyn, Goodies,powder, or aspirin products.  OK to take Tylenol.   ____ Stop supplements until after surgery.    ____ Bring C-Pap to the hospital.

## 2016-09-12 NOTE — Telephone Encounter (Signed)
Follow Up:    Travis [name] is following up on the clearance that she sent over 09-04-16 and today.She needs to know about him stopping his Aspirin today please.

## 2016-09-12 NOTE — Telephone Encounter (Deleted)
PT  IS  SCHEDULED  FOR BLADDER SURGERY   ON  Friday  NEED  DIRECTIONS ON HOLDING ASA  AS WELL IF  CLEARED  FROM CARDIAC  STANDPOINT  TO  PROCEED  WITH SURGERY .Adonis Housekeeper

## 2016-09-12 NOTE — Telephone Encounter (Signed)
AFTER REVIEWING PTS  CHART .  PT  HAS  ONLY BEEN SEEN  ONE  TIME  AND NO F/U  REQUIRED  NOTIFIED  AMY TO CONTACT PMD  FOR  DIRECTIONS  ON  ASA .Adonis Housekeeper

## 2016-09-18 MED ORDER — CEFAZOLIN SODIUM-DEXTROSE 2-4 GM/100ML-% IV SOLN
2.0000 g | INTRAVENOUS | Status: AC
  Administered 2016-09-19: 2 g via INTRAVENOUS

## 2016-09-19 ENCOUNTER — Encounter: Payer: Self-pay | Admitting: *Deleted

## 2016-09-19 ENCOUNTER — Ambulatory Visit
Admission: RE | Admit: 2016-09-19 | Discharge: 2016-09-19 | Disposition: A | Discharge status: Home / Self Care | Payer: Medicare Other | Source: Ambulatory Visit | Attending: Urology | Admitting: Urology

## 2016-09-19 ENCOUNTER — Ambulatory Visit: Payer: Medicare Other | Admitting: Registered Nurse

## 2016-09-19 ENCOUNTER — Telehealth: Payer: Self-pay | Admitting: Urology

## 2016-09-19 ENCOUNTER — Encounter
Admission: RE | Disposition: A | Discharge status: Home / Self Care | Payer: Self-pay | Source: Ambulatory Visit | Attending: Urology

## 2016-09-19 DIAGNOSIS — Z96652 Presence of left artificial knee joint: Secondary | ICD-10-CM | POA: Diagnosis not present

## 2016-09-19 DIAGNOSIS — Z9842 Cataract extraction status, left eye: Secondary | ICD-10-CM | POA: Diagnosis not present

## 2016-09-19 DIAGNOSIS — C672 Malignant neoplasm of lateral wall of bladder: Secondary | ICD-10-CM | POA: Insufficient documentation

## 2016-09-19 DIAGNOSIS — I10 Essential (primary) hypertension: Secondary | ICD-10-CM | POA: Insufficient documentation

## 2016-09-19 DIAGNOSIS — E785 Hyperlipidemia, unspecified: Secondary | ICD-10-CM | POA: Insufficient documentation

## 2016-09-19 DIAGNOSIS — Z833 Family history of diabetes mellitus: Secondary | ICD-10-CM | POA: Insufficient documentation

## 2016-09-19 DIAGNOSIS — Z803 Family history of malignant neoplasm of breast: Secondary | ICD-10-CM | POA: Diagnosis not present

## 2016-09-19 DIAGNOSIS — Z9841 Cataract extraction status, right eye: Secondary | ICD-10-CM | POA: Insufficient documentation

## 2016-09-19 DIAGNOSIS — Z809 Family history of malignant neoplasm, unspecified: Secondary | ICD-10-CM | POA: Diagnosis not present

## 2016-09-19 DIAGNOSIS — D494 Neoplasm of unspecified behavior of bladder: Secondary | ICD-10-CM | POA: Diagnosis not present

## 2016-09-19 DIAGNOSIS — M1990 Unspecified osteoarthritis, unspecified site: Secondary | ICD-10-CM | POA: Insufficient documentation

## 2016-09-19 DIAGNOSIS — Z823 Family history of stroke: Secondary | ICD-10-CM | POA: Diagnosis not present

## 2016-09-19 HISTORY — PX: TRANSURETHRAL RESECTION OF BLADDER TUMOR: SHX2575

## 2016-09-19 SURGERY — TURBT (TRANSURETHRAL RESECTION OF BLADDER TUMOR)
Anesthesia: General

## 2016-09-19 MED ORDER — FENTANYL CITRATE (PF) 100 MCG/2ML IJ SOLN
INTRAMUSCULAR | Status: DC | PRN
Start: 2016-09-19 — End: 2016-09-19
  Administered 2016-09-19 (×2): 25 ug via INTRAVENOUS
  Administered 2016-09-19: 50 ug via INTRAVENOUS

## 2016-09-19 MED ORDER — EPHEDRINE SULFATE 50 MG/ML IJ SOLN
INTRAMUSCULAR | Status: DC | PRN
  Administered 2016-09-19: 10 mg via INTRAVENOUS
  Administered 2016-09-19: 5 mg via INTRAVENOUS

## 2016-09-19 MED ORDER — ONDANSETRON HCL 4 MG/2ML IJ SOLN: 4.0000 mg | Freq: Once | INTRAMUSCULAR | Status: DC | PRN

## 2016-09-19 MED ORDER — HYDROCODONE-ACETAMINOPHEN 5-325 MG PO TABS: 1.0000 | ORAL_TABLET | Freq: Four times a day (QID) | ORAL | 0 refills | Status: DC | PRN

## 2016-09-19 MED ORDER — FENTANYL CITRATE (PF) 100 MCG/2ML IJ SOLN
INTRAMUSCULAR | Status: AC
  Filled 2016-09-19: qty 2

## 2016-09-19 MED ORDER — DEXAMETHASONE SODIUM PHOSPHATE 10 MG/ML IJ SOLN
INTRAMUSCULAR | Status: AC
  Filled 2016-09-19: qty 1

## 2016-09-19 MED ORDER — FENTANYL CITRATE (PF) 100 MCG/2ML IJ SOLN: 25.0000 ug | INTRAMUSCULAR | Status: DC | PRN

## 2016-09-19 MED ORDER — DEXAMETHASONE SODIUM PHOSPHATE 10 MG/ML IJ SOLN
INTRAMUSCULAR | Status: DC | PRN
  Administered 2016-09-19: 5 mg via INTRAVENOUS

## 2016-09-19 MED ORDER — ROCURONIUM BROMIDE 50 MG/5ML IV SOLN
INTRAVENOUS | Status: AC
  Filled 2016-09-19: qty 1

## 2016-09-19 MED ORDER — CEFAZOLIN SODIUM-DEXTROSE 2-4 GM/100ML-% IV SOLN
INTRAVENOUS | Status: AC
  Filled 2016-09-19: qty 100

## 2016-09-19 MED ORDER — ACETAMINOPHEN 10 MG/ML IV SOLN
INTRAVENOUS | Status: DC | PRN
  Administered 2016-09-19: 1000 mg via INTRAVENOUS

## 2016-09-19 MED ORDER — ROCURONIUM BROMIDE 100 MG/10ML IV SOLN
INTRAVENOUS | Status: DC | PRN
  Administered 2016-09-19: 5 mg via INTRAVENOUS
  Administered 2016-09-19: 25 mg via INTRAVENOUS

## 2016-09-19 MED ORDER — FAMOTIDINE 20 MG PO TABS
20.0000 mg | ORAL_TABLET | Freq: Once | ORAL | Status: AC
Start: 2016-09-19 — End: 2016-09-19
  Administered 2016-09-19: 20 mg via ORAL

## 2016-09-19 MED ORDER — CEPHALEXIN 500 MG PO CAPS: 500.0000 mg | ORAL_CAPSULE | Freq: Three times a day (TID) | ORAL | 0 refills | Status: DC

## 2016-09-19 MED ORDER — LACTATED RINGERS IV SOLN
INTRAVENOUS | Status: DC
  Administered 2016-09-19: 08:00:00 via INTRAVENOUS

## 2016-09-19 MED ORDER — EPHEDRINE SULFATE 50 MG/ML IJ SOLN
INTRAMUSCULAR | Status: AC
  Filled 2016-09-19: qty 1

## 2016-09-19 MED ORDER — ONDANSETRON HCL 4 MG/2ML IJ SOLN
INTRAMUSCULAR | Status: DC | PRN
  Administered 2016-09-19: 4 mg via INTRAVENOUS

## 2016-09-19 MED ORDER — PROPOFOL 10 MG/ML IV BOLUS
INTRAVENOUS | Status: DC | PRN
  Administered 2016-09-19: 150 mg via INTRAVENOUS

## 2016-09-19 MED ORDER — SEVOFLURANE IN SOLN
RESPIRATORY_TRACT | Status: AC
  Filled 2016-09-19: qty 250

## 2016-09-19 MED ORDER — ONDANSETRON HCL 4 MG/2ML IJ SOLN
INTRAMUSCULAR | Status: AC
  Filled 2016-09-19: qty 2

## 2016-09-19 MED ORDER — SUGAMMADEX SODIUM 200 MG/2ML IV SOLN
INTRAVENOUS | Status: DC | PRN
  Administered 2016-09-19: 200 mg via INTRAVENOUS

## 2016-09-19 MED ORDER — SUCCINYLCHOLINE CHLORIDE 20 MG/ML IJ SOLN
INTRAMUSCULAR | Status: DC | PRN
Start: 2016-09-19 — End: 2016-09-19
  Administered 2016-09-19: 100 mg via INTRAVENOUS

## 2016-09-19 MED ORDER — LIDOCAINE HCL (PF) 2 % IJ SOLN
INTRAMUSCULAR | Status: AC
  Filled 2016-09-19: qty 2

## 2016-09-19 MED ORDER — FAMOTIDINE 20 MG PO TABS
ORAL_TABLET | ORAL | Status: AC
  Filled 2016-09-19: qty 1

## 2016-09-19 MED ORDER — PROPOFOL 10 MG/ML IV BOLUS
INTRAVENOUS | Status: AC
  Filled 2016-09-19: qty 20

## 2016-09-19 MED ORDER — LIDOCAINE HCL (CARDIAC) 20 MG/ML IV SOLN
INTRAVENOUS | Status: DC | PRN
  Administered 2016-09-19: 100 mg via INTRAVENOUS

## 2016-09-19 SURGICAL SUPPLY — 25 items
BACTOSHIELD CHG 4% 4OZ (MISCELLANEOUS) ×1
BAG DRAIN CYSTO-URO LG1000N (MISCELLANEOUS) ×2 IMPLANT
BAG URO DRAIN 2000ML W/SPOUT (MISCELLANEOUS) IMPLANT
CATH FOL LEG HOLDER (MISCELLANEOUS) IMPLANT
CATH FOLEY 2WAY  5CC 16FR (CATHETERS)
CATH FOLEY 3WAY 30CC 24FR (CATHETERS)
CATH URTH 16FR FL 2W BLN LF (CATHETERS) IMPLANT
CATH URTH STD 24FR FL 3W 2 (CATHETERS) IMPLANT
ELECT LOOP 22F BIPOLAR SML (ELECTROSURGICAL) ×2
ELECT REM PT RETURN 9FT ADLT (ELECTROSURGICAL)
ELECTRODE LOOP 22F BIPOLAR SML (ELECTROSURGICAL) ×1 IMPLANT
ELECTRODE REM PT RTRN 9FT ADLT (ELECTROSURGICAL) IMPLANT
EVACUATOR ELLICK (MISCELLANEOUS) IMPLANT
GLOVE BIO SURGEON STRL SZ7.5 (GLOVE) ×2 IMPLANT
GOWN STRL REUS W/ TWL LRG LVL3 (GOWN DISPOSABLE) ×1 IMPLANT
GOWN STRL REUS W/TWL LRG LVL3 (GOWN DISPOSABLE) ×1
KIT RM TURNOVER CYSTO AR (KITS) ×2 IMPLANT
LOOP CUT BIPOLAR 24F LRG (ELECTROSURGICAL) IMPLANT
PACK CYSTO AR (MISCELLANEOUS) ×2 IMPLANT
SCRUB CHG 4% DYNA-HEX 4OZ (MISCELLANEOUS) ×1 IMPLANT
SET IRRIG Y TYPE TUR BLADDER L (SET/KITS/TRAYS/PACK) ×2 IMPLANT
SOL .9 NS 3000ML IRR  AL (IV SOLUTION) ×4
SOL .9 NS 3000ML IRR UROMATIC (IV SOLUTION) ×4 IMPLANT
SURGILUBE 2OZ TUBE FLIPTOP (MISCELLANEOUS) ×2 IMPLANT
WATER STERILE IRR 1000ML POUR (IV SOLUTION) ×2 IMPLANT

## 2016-09-19 NOTE — Op Note (Signed)
Date of procedure: 09/19/16  Preoperative diagnosis:  1. Bladder tumor   Postoperative diagnosis:  1. Bladder tumor 2   Procedure: 1. Transurethral resection of bladder tumor-5 cm 2  Surgeon: Baruch Gouty, MD  Anesthesia: General  Complications: None  Intraoperative findings: The patient had 2 bladder tumors on the right lateral wall of his bladder far away from the right UVJ. Both of these tumors are based on a narrow stalk. Resection the stalk results of incomplete resection and removal of each tumor. Pan cystoscopy was otherwise unremarkable.  EBL: None  Specimens: Bladder tumor to pathology  Drains: None  Disposition: Stable to the postanesthesia care unit  Indication for procedure: The patient is a 83 y.o. male with history of gross hematuria which a hematuria workup revealed a 4 cm tumor aggregate on the right lateral wall presents today for definitive management.  After reviewing the management options for treatment, the patient elected to proceed with the above surgical procedure(s). We have discussed the potential benefits and risks of the procedure, side effects of the proposed treatment, the likelihood of the patient achieving the goals of the procedure, and any potential problems that might occur during the procedure or recuperation. Informed consent has been obtained.  Description of procedure: The patient was met in the preoperative area. All risks, benefits, and indications of the procedure were described in great detail. The patient consented to the procedure. Preoperative antibiotics were given. The patient was taken to the operative theater. General anesthesia was induced per the anesthesia service. The patient was then placed in the dorsal lithotomy position and prepped and draped in the usual sterile fashion. A preoperative timeout was called.   A 24 French 30 resectoscope with visual obturator was inserted into the patient's bladder per urethra atraumatically.  The previously seen right lateral wall tumors were identified. These were to 5 cm tumors are based on a narrow stalk. Was able to resect the stalk of both tumors and this freed the tumor area. These tumors were then evacuated from the bladder. There is no other tumors seen. Muscle was seen in the specimen bed. Hemostasis was obtained was excellent. The right UVJ was far away and uninvolved from the resection. There were no further tumors and Pan cystoscopy. The patient's bladder was drained as well from anesthesia and transferred stable condition to postanesthesia care unit.   Plan: The patient will follow-up to discuss pathology results. Due to tumor size alone, he will likely need BCG therapy. He may also need a repeat resection if it is T1 disease and if patient is agreeable to repeat surgery.   Baruch Gouty, M.D.

## 2016-09-19 NOTE — Anesthesia Procedure Notes (Addendum)
Procedure Name: Intubation Date/Time: 09/19/2016 9:03 AM Performed by: Hedda Slade Pre-anesthesia Checklist: Patient identified, Patient being monitored, Timeout performed, Emergency Drugs available and Suction available Patient Re-evaluated:Patient Re-evaluated prior to inductionOxygen Delivery Method: Circle system utilized Preoxygenation: Pre-oxygenation with 100% oxygen Intubation Type: IV induction Ventilation: Mask ventilation without difficulty and Oral airway inserted - appropriate to patient size Laryngoscope Size: Mac and 4 Grade View: Grade I Tube type: Oral Tube size: 7.0 mm Number of attempts: 1 Airway Equipment and Method: Stylet Placement Confirmation: ETT inserted through vocal cords under direct vision,  positive ETCO2 and breath sounds checked- equal and bilateral Secured at: 23 cm Tube secured with: Tape Dental Injury: Teeth and Oropharynx as per pre-operative assessment

## 2016-09-19 NOTE — Telephone Encounter (Signed)
-----   Message from Nickie Retort, MD sent at 09/19/2016  9:58 AM EDT ----- Patient needs to see me in 1-2 weeks to discuss pathology. Thanks.

## 2016-09-19 NOTE — Discharge Instructions (Signed)
Transurethral Resection of Bladder Tumor, Care After Refer to this sheet in the next few weeks. These instructions provide you with information about caring for yourself after your procedure. Your health care provider may also give you more specific instructions. Your treatment has been planned according to current medical practices, but problems sometimes occur. Call your health care provider if you have any problems or questions after your procedure. What can I expect after the procedure? After the procedure, it is common to have:  A small amount of blood in your urine for up to 2 weeks.  Soreness or mild discomfort from your catheter. After your catheter is removed, you may have mild soreness, especially when urinating.  Pain in your lower abdomen. Follow these instructions at home:   Medicines   Take over-the-counter and prescription medicines only as told by your health care provider.  Do not drive or operate heavy machinery while taking prescription pain medicine.  Do not drive for 24 hours if you received a sedative.  If you were prescribed antibiotic medicine, take it as told by your health care provider. Do not stop taking the antibiotic even if you start to feel better. Activity   Return to your normal activities as told by your health care provider. Ask your health care provider what activities are safe for you.  Do not lift anything that is heavier than 10 lb (4.5 kg) for as long as told by your health care provider.  Avoid intense physical activity for as long as told by your health care provider.  Walk at least one time every day. This helps to prevent blood clots. You may increase your physical activity gradually as you start to feel better. General instructions   Do not drink alcohol for as long as told by your health care provider. This is especially important if you are taking prescription pain medicines.  Do not take baths, swim, or use a hot tub until your  health care provider approves.  If you have a catheter, follow instructions from your health care provider about caring for your catheter and your drainage bag.  Drink enough fluid to keep your urine clear or pale yellow.  Wear compression stockings as told by your health care provider. These stockings help to prevent blood clots and reduce swelling in your legs.  Keep all follow-up visits as told by your health care provider. This is important. Contact a health care provider if:  You have pain that gets worse or does not improve with medicine.  You have blood in your urine for more than 2 weeks.  You have cloudy or bad-smelling urine.  You become constipated. Signs of constipation may include having:  Fewer than three bowel movements in a week.  Difficulty having a bowel movement.  Stools that are dry, hard, or larger than normal.  You have a fever. Get help right away if:  You have:  Severe pain.  Bright-red blood in your urine.  Blood clots in your urine.  A lot of blood in your urine.  Your catheter has been removed and you are not able to urinate.  You have a catheter in place and the catheter is not draining urine. This information is not intended to replace advice given to you by your health care provider. Make sure you discuss any questions you have with your health care provider. Document Released: 04/09/2015 Document Revised: 12/30/2015 Document Reviewed: 01/18/2015 Elsevier Interactive Patient Education  2017 [employer]  INSTRUCTIONS   1) The drugs that you were given will stay in your system until tomorrow so for the next 24 hours you should not:  A) Drive an automobile B) Make any legal decisions C) Drink any alcoholic beverage   2) You may resume regular meals tomorrow.  Today it is better to start with liquids and gradually work up to solid foods.  You may eat anything you prefer, but it is better to start with  liquids, then soup and crackers, and gradually work up to solid foods.   3) Please notify your doctor immediately if you have any unusual bleeding, trouble breathing, redness and pain at the surgery site, drainage, fever, or pain not relieved by medication.    4) Additional Instructions:        Please contact your physician with any problems or Same Day Surgery at [phone], Monday through Friday 6 am to 4 pm, or Manuel Garcia at [facility] number at [phone].

## 2016-09-19 NOTE — Anesthesia Preprocedure Evaluation (Signed)
Anesthesia Evaluation  Patient identified by MRN, date of birth, ID band Patient awake    Reviewed: Allergy & Precautions, H&P , NPO status , Patient's Chart, lab work & pertinent test results, reviewed documented beta blocker date and time   History of Anesthesia Complications (+) history of anesthetic complications  Airway Mallampati: II   Neck ROM: full    Dental  (+) Poor Dentition   Pulmonary neg pulmonary ROS,    Pulmonary exam normal        Cardiovascular hypertension, negative cardio ROS Normal cardiovascular exam Rhythm:regular Rate:Normal     Neuro/Psych negative neurological ROS  negative psych ROS   GI/Hepatic negative GI ROS, Neg liver ROS,   Endo/Other  negative endocrine ROS  Renal/GU negative Renal ROS  negative genitourinary   Musculoskeletal  (+) Arthritis , Osteoarthritis,    Abdominal   Peds  Hematology negative hematology ROS (+)   Anesthesia Other Findings Past Medical History: No date: Arthritis No date: Complication of anesthesia     Comment: had a hard time recovering from anesthesia               after knee replacement neuro symptoms No date: Hyperlipidemia No date: Hypertension Past Surgical History: 1992: eye implants No date: EYE SURGERY Bilateral     Comment: Cataract Extraction with IOL Implants 1992: HERNIA REPAIR Right     Comment: Inguinal Hernia Repair 01/03/2016: INGUINAL HERNIA REPAIR Left     Comment: Procedure: LAPAROSCOPIC INGUINAL HERNIA;                Surgeon: Jules Husbands, MD;  Location: [facility]               ORS;  Service: General;  Laterality: Left; 03/18/2016: TOTAL KNEE ARTHROPLASTY Left     Comment: Procedure: TOTAL KNEE ARTHROPLASTY;  Surgeon:               Hessie Knows, MD;  Location: [facility] ORS;                Service: Orthopedics;  Laterality: Left;   Reproductive/Obstetrics negative OB ROS                             Anesthesia  Physical Anesthesia Plan  ASA: III  Anesthesia Plan: General   Post-op Pain Management:    Induction:   Airway Management Planned:   Additional Equipment:   Intra-op Plan:   Post-operative Plan:   Informed Consent: I have reviewed the patients History and Physical, chart, labs and discussed the procedure including the risks, benefits and alternatives for the proposed anesthesia with the patient or authorized representative who has indicated his/her understanding and acceptance.   Dental Advisory Given  Plan Discussed with: CRNA  Anesthesia Plan Comments:         Anesthesia Quick Evaluation

## 2016-09-19 NOTE — Telephone Encounter (Signed)
done

## 2016-09-19 NOTE — Anesthesia Postprocedure Evaluation (Signed)
Anesthesia Post Note  Patient: Travis [name]  Procedure(s) Performed: Procedure(s) (LRB): TRANSURETHRAL RESECTION OF BLADDER TUMOR (TURBT) MEDIUM (N/A)  Patient location during evaluation: PACU Anesthesia Type: General Level of consciousness: awake and alert Pain management: pain level controlled Vital Signs Assessment: post-procedure vital signs reviewed and stable Respiratory status: spontaneous breathing, nonlabored ventilation, respiratory function stable and patient connected to nasal cannula oxygen Cardiovascular status: blood pressure returned to baseline and stable Postop Assessment: no signs of nausea or vomiting Anesthetic complications: no     Last Vitals:  Vitals:   09/19/16 1019 09/19/16 1034  BP: (!) 152/79 (!) 151/80  Pulse: (!) 56 (!) 55  Resp: 14 16  Temp:  36.3 C    Last Pain:  Vitals:   09/19/16 1021  TempSrc:   PainSc: 2                  Molli Barrows

## 2016-09-19 NOTE — Transfer of Care (Signed)
Immediate Anesthesia Transfer of Care Note  Patient: Travis [name]  Procedure(s) Performed: Procedure(s): TRANSURETHRAL RESECTION OF BLADDER TUMOR (TURBT) MEDIUM (N/A)  Patient Location: PACU  Anesthesia Type:General  Level of Consciousness: awake, alert  and oriented  Airway & Oxygen Therapy: Patient Spontanous Breathing and Patient connected to face mask oxygen  Post-op Assessment: Report given to RN and Post -op Vital signs reviewed and stable  Post vital signs: Reviewed and stable  Last Vitals:  Vitals:   09/19/16 0744 09/19/16 0949  BP: (!) 145/81 (!) 148/82  Pulse: (!) 53 64  Resp: 20 12  Temp: 36.6 C 36.4 C    Last Pain:  Vitals:   09/19/16 0949  TempSrc:   PainSc: Asleep         Complications: No apparent anesthesia complications

## 2016-09-19 NOTE — H&P (Signed)
LENORD FRALIX 1932/02/22 [id number]  CC: Bladder tumor  HPI: The patient is an 83 year old gentleman who presents for completion of his gross hematuria workup. 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 This was confirmed with cystoscopy. He presents today for TURBT.     PMH: Past Medical History:  Diagnosis Date  . Arthritis   . Complication of anesthesia    had a hard time recovering from anesthesia after knee replacement neuro symptoms  . Hyperlipidemia   . Hypertension     Surgical History: Past Surgical History:  Procedure Laterality Date  . eye implants  1992  . EYE SURGERY Bilateral    Cataract Extraction with IOL Implants  . HERNIA REPAIR Right 1992   Inguinal Hernia Repair  . INGUINAL HERNIA REPAIR Left 01/03/2016   Procedure: LAPAROSCOPIC INGUINAL HERNIA;  Surgeon: Jules Husbands, MD;  Location: [facility] ORS;  Service: General;  Laterality: Left;  . TOTAL KNEE ARTHROPLASTY Left 03/18/2016   Procedure: TOTAL KNEE ARTHROPLASTY;  Surgeon: Hessie Knows, MD;  Location: [facility] ORS;  Service: Orthopedics;  Laterality: Left;     Allergies: No Known Allergies  Family History: Family History  Problem Relation Age of Onset  . Stroke Father   . Diabetes Sister   . Breast cancer Sister   . Cancer Maternal Aunt   . Hematuria Neg Hx   . Renal cancer Neg Hx   . Prostate cancer Neg Hx     Social History:  reports that he has never smoked. He has never used smokeless tobacco. He reports that he does not drink alcohol or use drugs.  ROS: 12 point ROS negative except for above                                        Physical Exam: BP (!) 145/81   Pulse (!) 53   Temp 97.8 F (36.6 C) (Oral)   Resp 20   SpO2 98%   Constitutional:  Alert and oriented, No acute distress. HEENT: [place] AT, moist mucus membranes.  Trachea midline, no masses. Cardiovascular: No clubbing, cyanosis, or edema. RRR Respiratory:  Normal respiratory effort, no increased work of breathing. Lungs clear GI: Abdomen is soft, nontender, nondistended, no abdominal masses GU: No CVA tenderness.  Skin: No rashes, bruises or suspicious lesions. Lymph: No cervical or inguinal adenopathy. Neurologic: Grossly intact, no focal deficits, moving all 4 extremities. Psychiatric: Normal mood and affect.  Laboratory Data: Lab Results  Component Value Date   WBC 4.3 09/12/2016   HGB 13.4 09/12/2016   HCT 42.5 09/12/2016   MCV 73.1 (L) 09/12/2016   PLT 207 09/12/2016    Lab Results  Component Value Date   CREATININE 1.01 09/12/2016    No results found for: PSA  No results found for: TESTOSTERONE  No results found for: HGBA1C  Urinalysis    Component Value Date/Time   COLORURINE STRAW (A) 03/05/2016 1417   APPEARANCEUR Cloudy (A) 09/04/2016 1438   LABSPEC 1.003 (L) 03/05/2016 1417   PHURINE 6.0 03/05/2016 1417   GLUCOSEU Negative 09/04/2016 1438   HGBUR 2+ (A) 03/05/2016 1417   BILIRUBINUR Negative 09/04/2016 1438   KETONESUR NEGATIVE 03/05/2016 1417   PROTEINUR 2+ (A) 09/04/2016 [place] 03/05/2016 1417   NITRITE Negative 09/04/2016 1438   NITRITE NEGATIVE 03/05/2016 1417   LEUKOCYTESUR  Negative 09/04/2016 1438     Assessment & Plan:    1. Bladder tumor -TURBT   Nickie Retort, MD  [facility] [address] [place], [place] [address] [phone]

## 2016-09-19 NOTE — Anesthesia Post-op Follow-up Note (Cosign Needed)
Anesthesia QCDR form completed.        

## 2016-09-20 ENCOUNTER — Encounter: Payer: Self-pay | Admitting: Urology

## 2016-09-22 LAB — SURGICAL PATHOLOGY

## 2016-09-22 NOTE — Telephone Encounter (Signed)
done

## 2016-10-02 ENCOUNTER — Ambulatory Visit: Payer: Medicare Other | Admitting: Urology

## 2016-10-02 ENCOUNTER — Encounter: Payer: Self-pay | Admitting: Urology

## 2016-10-02 VITALS — BP 117/77 | HR 62 | Ht 69.0 in | Wt 199.2 lb

## 2016-10-02 DIAGNOSIS — R310 Gross hematuria: Secondary | ICD-10-CM | POA: Diagnosis not present

## 2016-10-02 DIAGNOSIS — C678 Malignant neoplasm of overlapping sites of bladder: Secondary | ICD-10-CM

## 2016-10-02 NOTE — Progress Notes (Signed)
10/02/2016 11:31 AM   Travis [name] 1932-03-24 [id number]  Referring provider: Kirk Ruths, MD [facility] [place], [place] [address]  Chief Complaint  Patient presents with  . Results    Discuss Pathology    HPI: The patient is an 83 year old gentleman presents after undergoing a TURBT for bladder tumor 2 that was found to have gross hematuria workup that was otherwise negative. Pathology showed stage pTa high-grade papillary urothelial carcinoma.  The patient had 2 bladder tumors on the right lateral wall of his bladder far away from the right UVJ. Both of these tumors are based on a narrow stalk. Resection the stalk results of incomplete resection and removal of each tumor. Pan cystoscopy was otherwise unremarkable. The tumors were approximate 5 cm each as estimated during cystoscopy.   PMH: Past Medical History:  Diagnosis Date  . Arthritis   . Complication of anesthesia    had a hard time recovering from anesthesia after knee replacement neuro symptoms  . Hyperlipidemia   . Hypertension     Surgical History: Past Surgical History:  Procedure Laterality Date  . eye implants  1992  . EYE SURGERY Bilateral    Cataract Extraction with IOL Implants  . HERNIA REPAIR Right 1992   Inguinal Hernia Repair  . INGUINAL HERNIA REPAIR Left 01/03/2016   Procedure: LAPAROSCOPIC INGUINAL HERNIA;  Surgeon: Jules Husbands, MD;  Location: [facility] OR[facility];  Service: General;  Laterality: Left;  . TOTAL KNEE ARTHROPLASTY Left 03/18/2016   Procedure: TOTAL KNEE ARTHROPLASTY;  Surgeon: Hessie Knows, MD;  Location: [facility];  Service: Orthopedics;  Laterality: Left;  . TRANSURETHRAL RESECTION OF BLADDER TUMOR N/A 09/19/2016   Procedure: TRANSURETHRAL RESECTION OF BLADDER TUMOR (TURBT) MEDIUM;  Surgeon: Nickie Retort, MD;  Location: [facility] ORS;  Service: Urology;  Laterality: N/A;    Home Medications:  Allergies as of 10/02/2016   No Known Allergies     Medication List       Accurate as of 10/02/16 11:31 AM. Always use your most recent med list.          acetaminophen 650 MG CR tablet Commonly known as:  TYLENOL Take 1,300 mg by mouth daily as needed for pain.   amLODipine 5 MG tablet Commonly known as:  NORVASC Take 5 mg by mouth every evening.   aspirin EC 81 MG tablet Take 81 mg by mouth every evening.   cephALEXin 500 MG capsule Commonly known as:  KEFLEX Take 1 capsule (500 mg total) by mouth 3 (three) times daily.   HYDROcodone-acetaminophen 5-325 MG tablet Commonly known as:  NORCO Take 1 tablet by mouth every 6 (six) hours as needed for moderate pain.   Melatonin 10 MG Tabs Take 10 mg by mouth daily as needed (sleep).   pravastatin 40 MG tablet Commonly known as:  PRAVACHOL Take 40 mg by mouth every evening.       Allergies: No Known Allergies  Family History: Family History  Problem Relation Age of Onset  . Stroke Father   . Diabetes Sister   . Breast cancer Sister   . Cancer Maternal Aunt   . Hematuria Neg Hx   . Renal cancer Neg Hx   . Prostate cancer Neg Hx   . Bladder Cancer Neg Hx     Social History:  reports that he has never smoked. He has never used smokeless tobacco. He reports that he does not drink alcohol or use drugs.  ROS: UROLOGY Frequent Urination?: No Hard to postpone urination?: No Burning/pain with urination?: No Get up at night to urinate?: No Leakage of urine?: No Urine stream starts and stops?: No Trouble starting stream?: No Do you have to strain to urinate?: No Blood in urine?: No Urinary tract infection?: No Sexually transmitted disease?: No Injury to kidneys or bladder?: No Painful intercourse?: No Weak stream?: No Erection problems?: No Penile pain?: No  Gastrointestinal Nausea?: No Vomiting?: No Indigestion/heartburn?: No Diarrhea?: No Constipation?: No  Constitutional Fever: No Night sweats?: No Weight loss?: No Fatigue?: No  Skin Skin  rash/lesions?: No Itching?: No  Eyes Blurred vision?: No Double vision?: No  Ears/Nose/Throat Sore throat?: No Sinus problems?: No  Hematologic/Lymphatic Swollen glands?: No Easy bruising?: No  Cardiovascular Leg swelling?: No Chest pain?: No  Respiratory Cough?: No Shortness of breath?: No  Endocrine Excessive thirst?: No  Musculoskeletal Back pain?: No Joint pain?: Yes  Neurological Headaches?: No Dizziness?: No  Psychologic Depression?: No Anxiety?: No  Physical Exam: BP 117/77   Pulse 62   Ht 5\' 9"  (1.753 m)   Wt 199 lb 3.2 oz (90.4 kg)   BMI 29.42 kg/m   Constitutional:  Alert and oriented, No acute distress. HEENT: [place] AT, moist mucus membranes.  Trachea midline, no masses. Cardiovascular: No clubbing, cyanosis, or edema. Respiratory: Normal respiratory effort, no increased work of breathing. GI: Abdomen is soft, nontender, nondistended, no abdominal masses GU: No CVA tenderness.  Skin: No rashes, bruises or suspicious lesions. Lymph: No cervical or inguinal adenopathy. Neurologic: Grossly intact, no focal deficits, moving all 4 extremities. Psychiatric: Normal mood and affect.  Laboratory Data: Lab Results  Component Value Date   WBC 4.3 09/12/2016   HGB 13.4 09/12/2016   HCT 42.5 09/12/2016   MCV 73.1 (L) 09/12/2016   PLT 207 09/12/2016    Lab Results  Component Value Date   CREATININE 1.01 09/12/2016    No results found for: PSA  No results found for: TESTOSTERONE  No results found for: HGBA1C  Urinalysis    Component Value Date/Time   COLORURINE STRAW (A) 03/05/2016 1417   APPEARANCEUR Cloudy (A) 09/04/2016 1438   LABSPEC 1.003 (L) 03/05/2016 1417   PHURINE 6.0 03/05/2016 1417   GLUCOSEU Negative 09/04/2016 1438   HGBUR 2+ (A) 03/05/2016 1417   BILIRUBINUR Negative 09/04/2016 [place] 03/05/2016 1417   PROTEINUR 2+ (A) 09/04/2016 [place] 03/05/2016 1417   NITRITE Negative 09/04/2016  1438   NITRITE NEGATIVE 03/05/2016 1417   LEUKOCYTESUR Negative 09/04/2016 1438      Assessment & Plan:    1. High risk non muscle invasive bladder I discussed with the patient and his daughter his diagnosis of high-risk bladder cancer. They understand that he falls in the high-risk category due to the multifocal tumors and size. He is however stage pTa which is relatively favorable. We discussed the standard of care at this point would be induction BCG followed by 3 years of maintenance BCG assuming no recurrences. He understands the risks, benefits, indications of BCG. He understands the risks include dysuria, suprapubic discomfort, feeling similar to urinary tract infection, and a very small risk of BCG sepsis. He was also informed that he would need to bleach his toilet after his first void after each treatment due to the fact that BCG is a living organism. All questions were answered. The patient has elected to proceed with BCG therapy. He'll be scheduled for induction BCG in 6 weeks.  He will be scheduled for cystoscopy 3 months after completion of his BCG.  2. Gross Hematuria -2/2 above  Nickie Retort, MD  [facility] [address], [place], [place] [address] [phone]

## 2016-10-08 ENCOUNTER — Ambulatory Visit: Payer: Medicare Other

## 2016-10-16 ENCOUNTER — Ambulatory Visit: Payer: Medicare Other | Admitting: Internal Medicine

## 2016-11-09 NOTE — Progress Notes (Signed)
11/10/2016 9:56 PM   Travis [name] 12/16/1931 [id number]  Referring provider: Kirk Ruths, MD [facility] [place], [place] [address]  Chief Complaint  Patient presents with  . Bladder Cancer    BCG #1-     HPI: 83 yo AAM who presents today to begin his induction series of BCG.  This will be # 1/6.  Background history The patient is an 83 year old gentleman presents after undergoing a TURBT for bladder tumor 2 that was found to have gross hematuria workup that was otherwise negative. Pathology showed stage pTa high-grade papillary urothelial carcinoma.  The patient had 2 bladder tumors on the right lateral wall of his bladder far away from the right UVJ. Both of these tumors are based on a narrow stalk. Resection the stalk results of incomplete resection and removal of each tumor. Pan cystoscopy was otherwise unremarkable. The tumors were approximate 5 cm each as estimated during cystoscopy.  Today will be his first instillation of BCG into his bladder for pTa high-grade papillary urothelial carcinoma.   His main complaints are nocturia and a weak urinary stream. These are baseline symptoms.  He has not had dysuria, suprapubic pain or gross hematuria. He is not having fevers, chills, nausea or vomiting. He does not have a new cough. His UA today is unremarkable.     PMH: Past Medical History:  Diagnosis Date  . Arthritis   . Complication of anesthesia    had a hard time recovering from anesthesia after knee replacement neuro symptoms  . Hyperlipidemia   . Hypertension     Surgical History: Past Surgical History:  Procedure Laterality Date  . eye implants  1992  . EYE SURGERY Bilateral    Cataract Extraction with IOL Implants  . HERNIA REPAIR Right 1992   Inguinal Hernia Repair  . INGUINAL HERNIA REPAIR Left 01/03/2016   Procedure: LAPAROSCOPIC INGUINAL HERNIA;  Surgeon: Jules Husbands, MD;  Location: [facility] ORS;  Service: General;   Laterality: Left;  . TOTAL KNEE ARTHROPLASTY Left 03/18/2016   Procedure: TOTAL KNEE ARTHROPLASTY;  Surgeon: Hessie Knows, MD;  Location: [facility] OR[facility];  Service: Orthopedics;  Laterality: Left;  . TRANSURETHRAL RESECTION OF BLADDER TUMOR N/A 09/19/2016   Procedure: TRANSURETHRAL RESECTION OF BLADDER TUMOR (TURBT) MEDIUM;  Surgeon: Nickie Retort, MD;  Location: [facility] ORS;  Service: Urology;  Laterality: N/A;    Home Medications:  Allergies as of 11/10/2016   No Known Allergies     Medication List       Accurate as of 11/10/16 11:59 PM. Always use your most recent med list.          acetaminophen 650 MG CR tablet Commonly known as:  TYLENOL Take 1,300 mg by mouth daily as needed for pain.   amLODipine 5 MG tablet Commonly known as:  NORVASC Take 5 mg by mouth every evening.   aspirin EC 81 MG tablet Take 81 mg by mouth every evening.   Melatonin 10 MG Tabs Take 10 mg by mouth daily as needed (sleep).   pravastatin 40 MG tablet Commonly known as:  PRAVACHOL Take 40 mg by mouth every evening.       Allergies: No Known Allergies  Family History: Family History  Problem Relation Age of Onset  . Stroke Father   . Diabetes Sister   . Breast cancer Sister   . Cancer Maternal Aunt   . Hematuria Neg Hx   . Renal cancer Neg Hx   .  Prostate cancer Neg Hx   . Bladder Cancer Neg Hx     Social History:  reports that he has never smoked. He has never used smokeless tobacco. He reports that he does not drink alcohol or use drugs.  ROS: UROLOGY Frequent Urination?: No Hard to postpone urination?: No Burning/pain with urination?: No Get up at night to urinate?: Yes Leakage of urine?: No Urine stream starts and stops?: No Trouble starting stream?: No Do you have to strain to urinate?: No Blood in urine?: No Urinary tract infection?: No Sexually transmitted disease?: No Injury to kidneys or bladder?: No Painful intercourse?: No Weak stream?: Yes Erection problems?:  Yes Penile pain?: No  Gastrointestinal Nausea?: No Vomiting?: No Indigestion/heartburn?: No Diarrhea?: No Constipation?: No  Constitutional Fever: No Night sweats?: No Weight loss?: No Fatigue?: No  Skin Skin rash/lesions?: No Itching?: No  Eyes Blurred vision?: No Double vision?: No  Ears/Nose/Throat Sore throat?: No Sinus problems?: No  Hematologic/Lymphatic Swollen glands?: No Easy bruising?: No  Cardiovascular Leg swelling?: No Chest pain?: No  Respiratory Cough?: No Shortness of breath?: No  Endocrine Excessive thirst?: No  Musculoskeletal Back pain?: No Joint pain?: No  Neurological Headaches?: No Dizziness?: No  Psychologic Depression?: No Anxiety?: No  Physical Exam: BP 117/73   Pulse 67   Ht 5\' 9"  (1.753 m)   Wt 199 lb (90.3 kg)   BMI 29.39 kg/m   Constitutional:  Alert and oriented, No acute distress. HEENT:  AT, moist mucus membranes.  Trachea midline, no masses. Cardiovascular: No clubbing, cyanosis, or edema. Respiratory: Normal respiratory effort, no increased work of breathing. GI: Abdomen is soft, nontender, nondistended, no abdominal masses GU: No CVA tenderness.  Skin: No rashes, bruises or suspicious lesions. Lymph: No cervical or inguinal adenopathy. Neurologic: Grossly intact, no focal deficits, moving all 4 extremities. Psychiatric: Normal mood and affect.  Laboratory Data: Lab Results  Component Value Date   WBC 4.3 09/12/2016   HGB 13.4 09/12/2016   HCT 42.5 09/12/2016   MCV 73.1 (L) 09/12/2016   PLT 207 09/12/2016    Lab Results  Component Value Date   CREATININE 1.01 09/12/2016   I have reviewed the labs  Urinalysis Unremarkable.  See EPIC   Assessment & Plan:    1. Malignant neoplasm of urinary bladder, high risk non muscle invasive bladder cancer  - stage pTa  - Reviewed BCG treatment course, possible side effects including BCG sepsis, bladder irritation, worsening of her urinary  symptoms  - # 1 of 6 BCG installed today  - Patient was instructed to pour bleach down his/her toilet for the next 6 hours  -  Instructed to call the office if she should experience fevers greater than 102, chills/rigors, onset of a new cough, night sweats or further bladder spasms or inability to urinate   - RTC in one week for # 2 BCG  - Surveillance protocol also discussed today including cystoscopy every 3 months for at least 2 years and then spread out thereafter  - Urinalysis, Complete  - bcg vaccine injection 81 mg; Instill 3.24 mLs (81 mg total) into the bladder once.  - lidocaine (XYLOCAINE) 2 % jelly 1 application; Place 1 application into the urethra once.     Zara Council, [facility] [address] [place],  [address] [phone]

## 2016-11-10 ENCOUNTER — Ambulatory Visit (INDEPENDENT_AMBULATORY_CARE_PROVIDER_SITE_OTHER): Payer: Medicare Other | Admitting: Urology

## 2016-11-10 ENCOUNTER — Encounter: Payer: Self-pay | Admitting: Urology

## 2016-11-10 VITALS — BP 117/73 | HR 67 | Ht 69.0 in | Wt 199.0 lb

## 2016-11-10 DIAGNOSIS — C678 Malignant neoplasm of overlapping sites of bladder: Secondary | ICD-10-CM

## 2016-11-10 LAB — URINALYSIS, COMPLETE
Bilirubin, UA: NEGATIVE
Glucose, UA: NEGATIVE
Ketones, UA: NEGATIVE
Leukocytes, UA: NEGATIVE
Nitrite, UA: NEGATIVE
Protein, UA: NEGATIVE
Specific Gravity, UA: 1.01 (ref 1.005–1.030)
Urobilinogen, Ur: 0.2 mg/dL (ref 0.2–1.0)
pH, UA: 5 (ref 5.0–7.5)

## 2016-11-10 MED ORDER — BCG LIVE 50 MG IS SUSR
3.2400 mL | Freq: Once | INTRAVESICAL | Status: AC
  Administered 2016-11-10: 81 mg via INTRAVESICAL

## 2016-11-10 NOTE — Progress Notes (Signed)
BCG Bladder Instillation  BCG # 1  Due to Bladder Cancer patient is present today for a BCG treatment. Patient was cleaned and prepped in a sterile fashion with betadine and lidocaine 2% jelly was instilled into the urethra.  A 14FR catheter was inserted, urine return was noted 72ml, urine was yellow in color.  8ml of reconstituted BCG was instilled into the bladder. The catheter was then removed. Patient tolerated well, no complications were noted. Patient was kept in office for 23min post instillation. Instructions and consent were reviewed and signed at today's visit.  Preformed by: Zara Council, PAC  Follow up/ Additional notes: next week for BCG #2

## 2016-11-17 ENCOUNTER — Ambulatory Visit: Payer: Medicare Other | Admitting: Urology

## 2016-11-17 ENCOUNTER — Encounter: Payer: Self-pay | Admitting: Urology

## 2016-11-17 VITALS — BP 104/70 | HR 61 | Ht 69.0 in | Wt 199.0 lb

## 2016-11-17 DIAGNOSIS — C679 Malignant neoplasm of bladder, unspecified: Secondary | ICD-10-CM

## 2016-11-17 LAB — MICROSCOPIC EXAMINATION: Epithelial Cells (non renal): NONE SEEN /HPF

## 2016-11-17 LAB — URINALYSIS, COMPLETE
Bilirubin, UA: NEGATIVE
Glucose, UA: NEGATIVE
Ketones, UA: NEGATIVE
Nitrite, UA: NEGATIVE
Protein, UA: NEGATIVE
Specific Gravity, UA: 1.02 (ref 1.005–1.030)
Urobilinogen, Ur: 1 mg/dL (ref 0.2–1.0)
pH, UA: 6 (ref 5.0–7.5)

## 2016-11-17 MED ORDER — BCG LIVE 50 MG IS SUSR
3.2400 mL | Freq: Once | INTRAVESICAL | Status: AC
  Administered 2016-11-17: 81 mg via INTRAVESICAL

## 2016-11-17 NOTE — Progress Notes (Signed)
   11/17/16  CC:  Chief Complaint  Patient presents with  . Bladder Cancer    BCG #2-     HPI: 83 year old male with a history of high-grade TA TCC, large volume who presents today for BCG 2/6.  He reports that after the last administration, he felt fatigued, malaise, and a productive cough. He was seen by his PCP and given azithromycin but has not taken this medication.  He denies any urinary symptoms. UA today is unremarkable.  He denies any fevers or chills.  Blood pressure 104/70, pulse 61, height 5\' 9"  (1.753 m), weight 199 lb (90.3 kg). NED. A&Ox3.   No respiratory distress   Abd soft, NT, ND Normal phallus with bilateral descended testicles  UA reviewed, no evidence of infection  See attached BCG note.  Assessment/ Plan:  1. Malignant neoplasm of urinary bladder, high risk non muscle invasive bladder cancer             - stage pTa             - Reviewed BCG treatment course, possible side effects including BCG sepsis, bladder irritation, worsening of his urinary symptoms             - # 2 of 6 BCG installed today             - Patient was instructed to pour bleach down his/her toilet for the next 6 hours             -  Instructed to call the office if she should experience fevers greater than 102, chills/rigors, onset of a new cough, night sweats or further bladder spasms or inability to urinate              - RTC in one week for # 3 BCG             - Surveillance protocol also discussed today including cystoscopy every 3 months for at least 2 years and then spread out thereafter             - Urinalysis, Complete             - bcg vaccine injection 81 mg; Instill 3.24 mLs (81 mg total) into the bladder once.             - lidocaine (XYLOCAINE) 2 % jelly 1 application; Place 1 application into the urethra once.

## 2016-11-17 NOTE — Progress Notes (Signed)
BCG Bladder Instillation  BCG # 2  Due to Bladder Cancer patient is present today for a BCG treatment. Patient was cleaned and prepped in a sterile fashion with betadine and lidocaine 2% jelly was instilled into the urethra.  A 14FR catheter was inserted, urine return was noted 5ml, urine was yellow in color.  49ml of reconstituted BCG was instilled into the bladder. The catheter was then removed. Patient tolerated well, no complications were noted Preformed by: Hollice Espy, MD and Sarah watts, CMA  Follow up/ Additional notes: next week

## 2016-11-23 NOTE — Progress Notes (Signed)
11/24/2016 2:36 PM   Travis [name] 06-23-1931 [id number]  Referring provider: Kirk Ruths, MD [place] [facility] [place], [place] [address]  Chief Complaint  Patient presents with  . Bladder Cancer    BCG  3    HPI: 83 yo AAM who presents today to begin his induction series of BCG.  This will be # 3/6.  Background history The patient is an 83 year old gentleman presents after undergoing a TURBT for bladder tumor 2 that was found to have gross hematuria workup that was otherwise negative. Pathology showed stage pTa high-grade papillary urothelial carcinoma.  The patient had 2 bladder tumors on the right lateral wall of his bladder far away from the right UVJ. Both of these tumors are based on a narrow stalk. Resection the stalk results of incomplete resection and removal of each tumor. Pan cystoscopy was otherwise unremarkable. The tumors were approximate 5 cm each as estimated during cystoscopy.  Today will be his third instillation of BCG into his bladder for pTa high-grade papillary urothelial carcinoma.   His main complaints are nocturia, a weak urinary stream and fatigue.  He has not had dysuria, suprapubic pain or gross hematuria. He is not having fevers, chills, nausea or vomiting. He does not have a new cough. His UA today is unremarkable.     PMH: Past Medical History:  Diagnosis Date  . Arthritis   . Complication of anesthesia    had a hard time recovering from anesthesia after knee replacement neuro symptoms  . Hyperlipidemia   . Hypertension     Surgical History: Past Surgical History:  Procedure Laterality Date  . eye implants  1992  . EYE SURGERY Bilateral    Cataract Extraction with IOL Implants  . HERNIA REPAIR Right 1992   Inguinal Hernia Repair  . INGUINAL HERNIA REPAIR Left 01/03/2016   Procedure: LAPAROSCOPIC INGUINAL HERNIA;  Surgeon: Jules Husbands, MD;  Location: [facility] ORS;  Service: General;  Laterality: Left;  .  TOTAL KNEE ARTHROPLASTY Left 03/18/2016   Procedure: TOTAL KNEE ARTHROPLASTY;  Surgeon: Hessie Knows, MD;  Location: [facility] ORS;  Service: Orthopedics;  Laterality: Left;  . TRANSURETHRAL RESECTION OF BLADDER TUMOR N/A 09/19/2016   Procedure: TRANSURETHRAL RESECTION OF BLADDER TUMOR (TURBT) MEDIUM;  Surgeon: Nickie Retort, MD;  Location: [facility] ORS;  Service: Urology;  Laterality: N/A;    Home Medications:  Allergies as of 11/24/2016   No Known Allergies     Medication List       Accurate as of 11/24/16  2:36 PM. Always use your most recent med list.          acetaminophen 650 MG CR tablet Commonly known as:  TYLENOL Take 1,300 mg by mouth daily as needed for pain.   amLODipine 5 MG tablet Commonly known as:  NORVASC Take 5 mg by mouth every evening.   aspirin EC 81 MG tablet Take 81 mg by mouth every evening.   Melatonin 10 MG Tabs Take 10 mg by mouth daily as needed (sleep).   methylPREDNISolone 4 MG Tbpk tablet Commonly known as:  MEDROL DOSEPAK   pravastatin 40 MG tablet Commonly known as:  PRAVACHOL Take 40 mg by mouth every evening.   sulfamethoxazole-trimethoprim 800-160 MG tablet Commonly known as:  BACTRIM DS,SEPTRA DS       Allergies: No Known Allergies  Family History: Family History  Problem Relation Age of Onset  . Stroke Father   . Diabetes Sister   .  Breast cancer Sister   . Cancer Maternal Aunt   . Hematuria Neg Hx   . Renal cancer Neg Hx   . Prostate cancer Neg Hx   . Bladder Cancer Neg Hx     Social History:  reports that he has never smoked. He has never used smokeless tobacco. He reports that he does not drink alcohol or use drugs.  ROS: UROLOGY Frequent Urination?: Yes Hard to postpone urination?: No Burning/pain with urination?: No Get up at night to urinate?: No Leakage of urine?: No Urine stream starts and stops?: No Trouble starting stream?: No Do you have to strain to urinate?: No Blood in urine?: No Urinary tract  infection?: No Sexually transmitted disease?: No Injury to kidneys or bladder?: No Painful intercourse?: No Weak stream?: Yes Erection problems?: No Penile pain?: No  Gastrointestinal Nausea?: No Vomiting?: No Indigestion/heartburn?: No Diarrhea?: No Constipation?: No  Constitutional Fever: No Night sweats?: No Weight loss?: No Fatigue?: Yes  Skin Skin rash/lesions?: No Itching?: No  Eyes Blurred vision?: No Double vision?: No  Ears/Nose/Throat Sore throat?: No Sinus problems?: No  Hematologic/Lymphatic Swollen glands?: No Easy bruising?: No  Cardiovascular Leg swelling?: No Chest pain?: No  Respiratory Cough?: No Shortness of breath?: No  Endocrine Excessive thirst?: No  Musculoskeletal Back pain?: No Joint pain?: No  Neurological Headaches?: No Dizziness?: No  Psychologic Depression?: No Anxiety?: No  Physical Exam: BP 99/62   Pulse (!) 50   Ht 5\' 9"  (1.753 m)   Wt 197 lb (89.4 kg)   BMI 29.09 kg/m   Constitutional:  Alert and oriented, No acute distress. HEENT: [place] AT, moist mucus membranes.  Trachea midline, no masses. Cardiovascular: No clubbing, cyanosis, or edema. Respiratory: Normal respiratory effort, no increased work of breathing. GI: Abdomen is soft, nontender, nondistended, no abdominal masses GU: No CVA tenderness.  Skin: No rashes, bruises or suspicious lesions. Lymph: No cervical or inguinal adenopathy. Neurologic: Grossly intact, no focal deficits, moving all 4 extremities. Psychiatric: Normal mood and affect.  Laboratory Data: Lab Results  Component Value Date   WBC 4.3 09/12/2016   HGB 13.4 09/12/2016   HCT 42.5 09/12/2016   MCV 73.1 (L) 09/12/2016   PLT 207 09/12/2016    Lab Results  Component Value Date   CREATININE 1.01 09/12/2016   I have reviewed the labs  Urinalysis Unremarkable.  See EPIC   Assessment & Plan:    1. Malignant neoplasm of urinary bladder, high risk non muscle invasive bladder  cancer  - stage pTa  - Reviewed BCG treatment course, possible side effects including BCG sepsis, bladder irritation, worsening of her urinary symptoms  - # 3 of 6 BCG installed today  - Patient was instructed to pour bleach down his/her toilet for the next 6 hours  -  Instructed to call the office if she should experience fevers greater than 102, chills/rigors, onset of a new cough, night sweats or further bladder spasms or inability to urinate   - RTC in one week for # 4 BCG  - Surveillance protocol also discussed today including cystoscopy every 3 months for at least 2 years and then spread out thereafter  - Urinalysis, Complete  - bcg vaccine injection 81 mg; Instill 3.24 mLs (81 mg total) into the bladder once.  - lidocaine (XYLOCAINE) 2 % jelly 1 application; Place 1 application into the urethra once.     Zara Council, [facility] [address] [place], [place] [address] [phone]

## 2016-11-24 ENCOUNTER — Encounter: Payer: Self-pay | Admitting: Urology

## 2016-11-24 ENCOUNTER — Ambulatory Visit: Payer: Medicare Other | Admitting: Urology

## 2016-11-24 VITALS — BP 99/62 | HR 50 | Ht 69.0 in | Wt 197.0 lb

## 2016-11-24 DIAGNOSIS — C679 Malignant neoplasm of bladder, unspecified: Secondary | ICD-10-CM | POA: Diagnosis not present

## 2016-11-24 LAB — URINALYSIS, COMPLETE
Bilirubin, UA: NEGATIVE
Glucose, UA: NEGATIVE
Ketones, UA: NEGATIVE
Leukocytes, UA: NEGATIVE
Nitrite, UA: NEGATIVE
Protein, UA: NEGATIVE
Specific Gravity, UA: 1.025 (ref 1.005–1.030)
Urobilinogen, Ur: 1 mg/dL (ref 0.2–1.0)
pH, UA: 5.5 (ref 5.0–7.5)

## 2016-11-24 LAB — MICROSCOPIC EXAMINATION

## 2016-11-24 MED ORDER — BCG LIVE 50 MG IS SUSR
3.2400 mL | Freq: Once | INTRAVESICAL | Status: AC
  Administered 2016-11-24: 81 mg via INTRAVESICAL

## 2016-11-24 MED ORDER — LIDOCAINE HCL 2 % EX GEL
1.0000 "application " | Freq: Once | CUTANEOUS | Status: AC
  Administered 2016-11-24: 1 via URETHRAL

## 2016-11-24 NOTE — Progress Notes (Signed)
BCG Bladder Instillation  BCG # 3  Due to Bladder Cancer patient is present today for a BCG treatment. Patient was cleaned and prepped in a sterile fashion with betadine and lidocaine 2% jelly was instilled into the urethra.  A 14FR catheter was inserted, urine return was noted 62ml, urine was yellow in color.  77ml of reconstituted BCG was instilled into the bladder. The catheter was then removed. Patient tolerated well, no complications were noted .  Preformed by: Royden Purl, PA Student and Lyndee Hensen CMA  Follow up/ Additional notes: One week

## 2016-11-30 NOTE — Progress Notes (Addendum)
12/01/2016 4:42 PM   Travis [name] 20-Sep-1931 [id number]  Referring provider: Kirk Ruths, MD [place] [facility] [place], [place] [address]  Chief Complaint  Patient presents with  . Bladder Cancer    BCG  4       HPI: 83 yo AAM who presents today to begin his induction series of BCG.  This will be # 4/6.  Background history The patient is an 83 year old gentleman presents after undergoing a TURBT for bladder tumor 2 that was found to have gross hematuria workup that was otherwise negative. Pathology showed stage pTa high-grade papillary urothelial carcinoma.  The patient had 2 bladder tumors on the right lateral wall of his bladder far away from the right UVJ. Both of these tumors are based on a narrow stalk. Resection the stalk results of incomplete resection and removal of each tumor. Pan cystoscopy was otherwise unremarkable. The tumors were approximate 5 cm each as estimated during cystoscopy.  Today will be his fourth instillation of BCG into his bladder for pTa high-grade papillary urothelial carcinoma.   His main complaints are nocturia, a weak urinary stream and fatigue.  He has not had dysuria, suprapubic pain or gross hematuria. He is not having fevers, chills, nausea or vomiting. He does not have a new cough. His UA today is positive for 11-30 WBC's.     PMH: Past Medical History:  Diagnosis Date  . Arthritis   . Complication of anesthesia    had a hard time recovering from anesthesia after knee replacement neuro symptoms  . Hyperlipidemia   . Hypertension     Surgical History: Past Surgical History:  Procedure Laterality Date  . eye implants  1992  . EYE SURGERY Bilateral    Cataract Extraction with IOL Implants  . HERNIA REPAIR Right 1992   Inguinal Hernia Repair  . INGUINAL HERNIA REPAIR Left 01/03/2016   Procedure: LAPAROSCOPIC INGUINAL HERNIA;  Surgeon: Jules Husbands, MD;  Location: [facility] ORS;  Service: General;   Laterality: Left;  . TOTAL KNEE ARTHROPLASTY Left 03/18/2016   Procedure: TOTAL KNEE ARTHROPLASTY;  Surgeon: Hessie Knows, MD;  Location: [facility] ORS;  Service: Orthopedics;  Laterality: Left;  . TRANSURETHRAL RESECTION OF BLADDER TUMOR N/A 09/19/2016   Procedure: TRANSURETHRAL RESECTION OF BLADDER TUMOR (TURBT) MEDIUM;  Surgeon: Nickie Retort, MD;  Location: [facility] ORS;  Service: Urology;  Laterality: N/A;    Home Medications:  Allergies as of 12/01/2016   No Known Allergies     Medication List       Accurate as of 12/01/16  4:42 PM. Always use your most recent med list.          acetaminophen 650 MG CR tablet Commonly known as:  TYLENOL Take 1,300 mg by mouth daily as needed for pain.   amLODipine 5 MG tablet Commonly known as:  NORVASC Take 5 mg by mouth every evening.   aspirin EC 81 MG tablet Take 81 mg by mouth every evening.   Melatonin 10 MG Tabs Take 10 mg by mouth daily as needed (sleep).   methylPREDNISolone 4 MG Tbpk tablet Commonly known as:  MEDROL DOSEPAK   pravastatin 40 MG tablet Commonly known as:  PRAVACHOL Take 40 mg by mouth every evening.   sulfamethoxazole-trimethoprim 800-160 MG tablet Commonly known as:  BACTRIM DS,SEPTRA DS       Allergies: No Known Allergies  Family History: Family History  Problem Relation Age of Onset  . Stroke Father   .  Diabetes Sister   . Breast cancer Sister   . Cancer Maternal Aunt   . Hematuria Neg Hx   . Renal cancer Neg Hx   . Prostate cancer Neg Hx   . Bladder Cancer Neg Hx     Social History:  reports that he has never smoked. He has never used smokeless tobacco. He reports that he does not drink alcohol or use drugs.  ROS: UROLOGY Frequent Urination?: No Hard to postpone urination?: No Burning/pain with urination?: No Get up at night to urinate?: No Leakage of urine?: No Urine stream starts and stops?: No Trouble starting stream?: No Do you have to strain to urinate?: No Blood in urine?:  No Urinary tract infection?: No Sexually transmitted disease?: No Injury to kidneys or bladder?: No Painful intercourse?: No Weak stream?: Yes Erection problems?: No Penile pain?: No  Gastrointestinal Nausea?: No Vomiting?: No Indigestion/heartburn?: No Diarrhea?: No Constipation?: No  Constitutional Fever: No Night sweats?: No Weight loss?: No Fatigue?: No  Skin Skin rash/lesions?: No Itching?: No  Eyes Blurred vision?: No Double vision?: No  Ears/Nose/Throat Sore throat?: No Sinus problems?: No  Hematologic/Lymphatic Swollen glands?: No Easy bruising?: No  Cardiovascular Leg swelling?: No Chest pain?: No  Respiratory Cough?: No Shortness of breath?: No  Endocrine Excessive thirst?: No  Musculoskeletal Back pain?: No Joint pain?: No  Neurological Headaches?: No Dizziness?: No  Psychologic Depression?: No Anxiety?: No  Physical Exam: BP 113/70   Pulse (!) 53   Ht 5\' 9"  (1.753 m)   Wt 197 lb 11.2 oz (89.7 kg)   BMI 29.20 kg/m   Constitutional:  Alert and oriented, No acute distress. HEENT: [place] AT, moist mucus membranes.  Trachea midline, no masses. Cardiovascular: No clubbing, cyanosis, or edema. Respiratory: Normal respiratory effort, no increased work of breathing. GI: Abdomen is soft, nontender, nondistended, no abdominal masses GU: No CVA tenderness.  Skin: No rashes, bruises or suspicious lesions. Lymph: No cervical or inguinal adenopathy. Neurologic: Grossly intact, no focal deficits, moving all 4 extremities. Psychiatric: Normal mood and affect.  Laboratory Data: Lab Results  Component Value Date   WBC 4.3 09/12/2016   HGB 13.4 09/12/2016   HCT 42.5 09/12/2016   MCV 73.1 (L) 09/12/2016   PLT 207 09/12/2016    Lab Results  Component Value Date   CREATININE 1.01 09/12/2016   I have reviewed the labs  Urinalysis 11-30 WBC's.  See EPIC   Assessment & Plan:    1. Malignant neoplasm of urinary bladder, high risk non  muscle invasive bladder cancer  - stage pTa  - Reviewed BCG treatment course, possible side effects including BCG sepsis, bladder irritation, worsening of her urinary symptoms  - # 4 of 6 BCG installed today  - Patient was instructed to pour bleach down his/her toilet for the next 6 hours  -  Instructed to call the office if she should experience fevers greater than 102, chills/rigors, onset of a new cough, night sweats or further bladder spasms or inability to urinate   - RTC in one week for # 5 BCG  - Surveillance protocol also discussed today including cystoscopy every 3 months for at least 2 years and then spread out thereafter  - Urinalysis, Complete  - bcg vaccine injection 81 mg; Instill 3.24 mLs (81 mg total) into the bladder once.  - lidocaine (XYLOCAINE) 2 % jelly 1 application; Place 1 application into the urethra once.     Zara Council, PA-C  [facility] [address],  [place], [place] [address] [phone]

## 2016-12-01 ENCOUNTER — Ambulatory Visit (INDEPENDENT_AMBULATORY_CARE_PROVIDER_SITE_OTHER): Payer: Medicare Other | Admitting: Urology

## 2016-12-01 ENCOUNTER — Encounter: Payer: Self-pay | Admitting: Urology

## 2016-12-01 VITALS — BP 113/70 | HR 53 | Ht 69.0 in | Wt 197.7 lb

## 2016-12-01 DIAGNOSIS — C679 Malignant neoplasm of bladder, unspecified: Secondary | ICD-10-CM

## 2016-12-01 MED ORDER — BCG LIVE 50 MG IS SUSR
3.2400 mL | Freq: Once | INTRAVESICAL | Status: AC
  Administered 2016-12-01: 81 mg via INTRAVESICAL

## 2016-12-01 MED ORDER — LIDOCAINE HCL 2 % EX GEL
1.0000 "application " | Freq: Once | CUTANEOUS | Status: AC
  Administered 2016-12-01: 1 via URETHRAL

## 2016-12-01 NOTE — Progress Notes (Signed)
BCG Bladder Instillation  BCG # 4  Due to Bladder Cancer patient is present today for a BCG treatment. Patient was cleaned and prepped in a sterile fashion with betadine and lidocaine 2% jelly was instilled into the urethra.  A 14FR catheter was inserted, urine return was noted 71ml, urine was yellow in color.  1ml of reconstituted BCG was instilled into the bladder. The catheter was then removed. Patient tolerated well, no complications were noted.  Preformed by: Royden Purl, PA Student Fabio Pierce CMA  Follow up/ Additional notes: One week

## 2016-12-02 LAB — MICROSCOPIC EXAMINATION
Epithelial Cells (non renal): NONE SEEN /hpf (ref 0–10)
RBC, UA: NONE SEEN /hpf (ref 0–?)

## 2016-12-02 LAB — URINALYSIS, COMPLETE
Bilirubin, UA: NEGATIVE
Glucose, UA: NEGATIVE
Ketones, UA: NEGATIVE
Nitrite, UA: NEGATIVE
Specific Gravity, UA: >1.03 — ABNORMAL HIGH (ref 1.005–1.030)
Urobilinogen, Ur: 0.2 mg/dL (ref 0.2–1.0)
pH, UA: 5.5 (ref 5.0–7.5)

## 2016-12-07 NOTE — Progress Notes (Signed)
12/08/2016 3:58 PM   Travis [name] 09-Sep-1931 [id number]  Referring provider: Kirk Ruths, MD [facility] [place], [place] [address]  Chief Complaint  Patient presents with  . Bladder Cancer    BCG #5-     HPI: 83 yo AAM who presents today to begin his induction series of BCG.  This will be # 5/6.  Background history The patient is an 83 year old gentleman presents after undergoing a TURBT for bladder tumor 2 that was found to have gross hematuria workup that was otherwise negative. Pathology showed stage pTa high-grade papillary urothelial carcinoma.  The patient had 2 bladder tumors on the right lateral wall of his bladder far away from the right UVJ. Both of these tumors are based on a narrow stalk. Resection the stalk results of incomplete resection and removal of each tumor. Pan cystoscopy was otherwise unremarkable. The tumors were approximate 5 cm each as estimated during cystoscopy.  Today will be his fifth instillation of BCG into his bladder for pTa high-grade papillary urothelial carcinoma.   His main complaints are nocturia, a weak urinary stream and fatigue.  He has not had dysuria, suprapubic pain or gross hematuria. He is not having fevers, chills, nausea or vomiting. He does not have a new cough. His UA today is positive for 11-30 WBC's.     PMH: Past Medical History:  Diagnosis Date  . Arthritis   . Complication of anesthesia    had a hard time recovering from anesthesia after knee replacement neuro symptoms  . Hyperlipidemia   . Hypertension     Surgical History: Past Surgical History:  Procedure Laterality Date  . eye implants  1992  . EYE SURGERY Bilateral    Cataract Extraction with IOL Implants  . HERNIA REPAIR Right 1992   Inguinal Hernia Repair  . INGUINAL HERNIA REPAIR Left 01/03/2016   Procedure: LAPAROSCOPIC INGUINAL HERNIA;  Surgeon: Jules Husbands, MD;  Location: [facility] ORS;  Service: General;   Laterality: Left;  . TOTAL KNEE ARTHROPLASTY Left 03/18/2016   Procedure: TOTAL KNEE ARTHROPLASTY;  Surgeon: Hessie Knows, MD;  Location: [facility] ORS;  Service: Orthopedics;  Laterality: Left;  . TRANSURETHRAL RESECTION OF BLADDER TUMOR N/A 09/19/2016   Procedure: TRANSURETHRAL RESECTION OF BLADDER TUMOR (TURBT) MEDIUM;  Surgeon: Nickie Retort, MD;  Location: [facility] ORS;  Service: Urology;  Laterality: N/A;    Home Medications:  Allergies as of 12/08/2016   No Known Allergies     Medication List       Accurate as of 12/08/16  3:58 PM. Always use your most recent med list.          acetaminophen 650 MG CR tablet Commonly known as:  TYLENOL Take 1,300 mg by mouth daily as needed for pain.   amLODipine 5 MG tablet Commonly known as:  NORVASC Take 5 mg by mouth every evening.   aspirin EC 81 MG tablet Take 81 mg by mouth every evening.   Melatonin 10 MG Tabs Take 10 mg by mouth daily as needed (sleep).   pravastatin 40 MG tablet Commonly known as:  PRAVACHOL Take 40 mg by mouth every evening.       Allergies: No Known Allergies  Family History: Family History  Problem Relation Age of Onset  . Stroke Father   . Diabetes Sister   . Breast cancer Sister   . Cancer Maternal Aunt   . Hematuria Neg Hx   . Renal cancer Neg  Hx   . Prostate cancer Neg Hx   . Bladder Cancer Neg Hx     Social History:  reports that he has never smoked. He has never used smokeless tobacco. He reports that he does not drink alcohol or use drugs.  ROS: UROLOGY Frequent Urination?: No Hard to postpone urination?: No Burning/pain with urination?: No Get up at night to urinate?: No Leakage of urine?: No Urine stream starts and stops?: No Trouble starting stream?: No Do you have to strain to urinate?: No Blood in urine?: No Urinary tract infection?: No Sexually transmitted disease?: No Injury to kidneys or bladder?: No Painful intercourse?: No Weak stream?: No Erection problems?:  No Penile pain?: No  Gastrointestinal Nausea?: No Vomiting?: No Indigestion/heartburn?: No Diarrhea?: No Constipation?: No  Constitutional Fever: No Night sweats?: No Weight loss?: No Fatigue?: No  Skin Skin rash/lesions?: No Itching?: No  Eyes Blurred vision?: No Double vision?: No  Ears/Nose/Throat Sore throat?: No Sinus problems?: No  Hematologic/Lymphatic Swollen glands?: No Easy bruising?: No  Cardiovascular Leg swelling?: No Chest pain?: No  Respiratory Cough?: No Shortness of breath?: No  Endocrine Excessive thirst?: No  Musculoskeletal Back pain?: No Joint pain?: No  Neurological Headaches?: No Dizziness?: No  Psychologic Depression?: No Anxiety?: No  Physical Exam: BP 105/68   Pulse (!) 55   Ht 5\' 9"  (1.753 m)   Wt 198 lb (89.8 kg)   BMI 29.24 kg/m   Constitutional:  Alert and oriented, No acute distress. HEENT: [place] AT, moist mucus membranes.  Trachea midline, no masses. Cardiovascular: No clubbing, cyanosis, or edema. Respiratory: Normal respiratory effort, no increased work of breathing. GI: Abdomen is soft, nontender, nondistended, no abdominal masses GU: No CVA tenderness.  Skin: No rashes, bruises or suspicious lesions. Lymph: No cervical or inguinal adenopathy. Neurologic: Grossly intact, no focal deficits, moving all 4 extremities. Psychiatric: Normal mood and affect.  Laboratory Data: Lab Results  Component Value Date   WBC 4.3 09/12/2016   HGB 13.4 09/12/2016   HCT 42.5 09/12/2016   MCV 73.1 (L) 09/12/2016   PLT 207 09/12/2016    Lab Results  Component Value Date   CREATININE 1.01 09/12/2016   I have reviewed the labs  Urinalysis 11-30 WBC's.  See EPIC   Assessment & Plan:    1. Malignant neoplasm of urinary bladder, high risk non muscle invasive bladder cancer  - stage pTa  - Reviewed BCG treatment course, possible side effects including BCG sepsis, bladder irritation, worsening of her urinary  symptoms  - # 5 of 6 BCG installed today  - Patient was instructed to pour bleach down his/her toilet for the next 6 hours  -  Instructed to call the office if she should experience fevers greater than 102, chills/rigors, onset of a new cough, night sweats or further bladder spasms or inability to urinate   - RTC in one week for # 6 BCG  - Surveillance protocol also discussed today including cystoscopy every 3 months for at least 2 years and then spread out thereafter  - Urinalysis, Complete  - bcg vaccine injection 81 mg; Instill 3.24 mLs (81 mg total) into the bladder once.  - lidocaine (XYLOCAINE) 2 % jelly 1 application; Place 1 application into the urethra once.     Travis [name], [facility] [address], [place], [place] [address] [phone]

## 2016-12-08 ENCOUNTER — Encounter: Payer: Self-pay | Admitting: Urology

## 2016-12-08 ENCOUNTER — Ambulatory Visit: Payer: Medicare Other | Admitting: Urology

## 2016-12-08 VITALS — BP 105/68 | HR 55 | Ht 69.0 in | Wt 198.0 lb

## 2016-12-08 DIAGNOSIS — C679 Malignant neoplasm of bladder, unspecified: Secondary | ICD-10-CM

## 2016-12-08 LAB — MICROSCOPIC EXAMINATION

## 2016-12-08 LAB — URINALYSIS, COMPLETE
Bilirubin, UA: NEGATIVE
Glucose, UA: NEGATIVE
Ketones, UA: NEGATIVE
Leukocytes, UA: NEGATIVE
Nitrite, UA: NEGATIVE
Protein, UA: NEGATIVE
Specific Gravity, UA: >1.03 — ABNORMAL HIGH (ref 1.005–1.030)
Urobilinogen, Ur: 0.2 mg/dL (ref 0.2–1.0)
pH, UA: 5.5 (ref 5.0–7.5)

## 2016-12-08 MED ORDER — BCG LIVE 50 MG IS SUSR
3.2400 mL | Freq: Once | INTRAVESICAL | Status: AC
  Administered 2016-12-08: 81 mg via INTRAVESICAL

## 2016-12-08 NOTE — Progress Notes (Signed)
BCG Bladder Instillation  BCG # 5  Due to Bladder Cancer patient is present today for a BCG treatment. Patient was cleaned and prepped in a sterile fashion with betadine and lidocaine 2% jelly was instilled into the urethra.  A 14FR catheter was inserted, urine return was noted 53ml, urine was yellow in color.  22ml of reconstituted BCG was instilled into the bladder. The catheter was then removed. Patient tolerated well, no complications were noted  Preformed by: Zara Council, PAC and Lucy PA Student   Follow up/ Additional notes: next week

## 2016-12-16 ENCOUNTER — Encounter: Payer: Self-pay | Admitting: Urology

## 2016-12-16 ENCOUNTER — Ambulatory Visit (INDEPENDENT_AMBULATORY_CARE_PROVIDER_SITE_OTHER): Payer: Medicare Other | Admitting: Urology

## 2016-12-16 VITALS — BP 121/78 | HR 55 | Ht 69.0 in | Wt 199.9 lb

## 2016-12-16 DIAGNOSIS — C679 Malignant neoplasm of bladder, unspecified: Secondary | ICD-10-CM

## 2016-12-16 MED ORDER — BCG LIVE 50 MG IS SUSR
3.2400 mL | Freq: Once | INTRAVESICAL | Status: AC
  Administered 2016-12-16: 81 mg via INTRAVESICAL

## 2016-12-16 NOTE — Progress Notes (Signed)
BCG Bladder Instillation  BCG # 6  Due to Bladder Cancer patient is present today for a BCG treatment. Patient was cleaned and prepped in a sterile fashion with betadine and lidocaine 2% jelly was instilled into the urethra.  A 14FR catheter was inserted, urine return was noted 138ml, urine was yellow in color.  14ml of reconstituted BCG was instilled into the bladder. The catheter was then removed. Patient tolerated well, no complications were noted  Preformed by: Baruch Gouty, MD, Elberta Leatherwood, CMA  Follow up/ Additional notes: 3 months

## 2016-12-17 LAB — URINALYSIS, COMPLETE
Bilirubin, UA: NEGATIVE
Glucose, UA: NEGATIVE
Leukocytes, UA: NEGATIVE
Nitrite, UA: NEGATIVE
Protein, UA: NEGATIVE
Specific Gravity, UA: 1.02 (ref 1.005–1.030)
Urobilinogen, Ur: 0.2 mg/dL (ref 0.2–1.0)
pH, UA: 5.5 (ref 5.0–7.5)

## 2016-12-17 LAB — MICROSCOPIC EXAMINATION
RBC, UA: NONE SEEN /hpf (ref 0–?)
WBC, UA: NONE SEEN /hpf (ref 0–?)

## 2017-03-19 ENCOUNTER — Ambulatory Visit: Payer: Medicare Other | Admitting: Urology

## 2017-03-19 ENCOUNTER — Encounter: Payer: Self-pay | Admitting: Urology

## 2017-03-19 VITALS — BP 144/80 | HR 51 | Ht 69.0 in | Wt 199.2 lb

## 2017-03-19 DIAGNOSIS — C679 Malignant neoplasm of bladder, unspecified: Secondary | ICD-10-CM

## 2017-03-19 LAB — URINALYSIS, COMPLETE
Bilirubin, UA: NEGATIVE
Glucose, UA: NEGATIVE
Ketones, UA: NEGATIVE
Leukocytes, UA: NEGATIVE
Nitrite, UA: NEGATIVE
Protein, UA: NEGATIVE
RBC, UA: NEGATIVE
Specific Gravity, UA: 1.025 (ref 1.005–1.030)
Urobilinogen, Ur: 1 mg/dL (ref 0.2–1.0)
pH, UA: 5.5 (ref 5.0–7.5)

## 2017-03-19 MED ORDER — LIDOCAINE HCL 2 % EX GEL
1.0000 "application " | Freq: Once | CUTANEOUS | Status: AC
  Administered 2017-03-19: 1 via URETHRAL

## 2017-03-19 MED ORDER — CIPROFLOXACIN HCL 500 MG PO TABS
500.0000 mg | ORAL_TABLET | Freq: Once | ORAL | Status: AC
  Administered 2017-03-19: 500 mg via ORAL

## 2017-03-19 NOTE — Progress Notes (Signed)
   03/19/17  CC: No chief complaint on file.   HPI: The patient is an 83 year old gentleman presents for post induction BCG cystoscopy. He underwent a TURBT for bladder tumor 2 that was found during a gross hematuria workup that was otherwise negative. Pathology showed stage pTa high-grade papillary urothelial carcinoma.  The patient had 2 bladder tumors on the right lateral wall of his bladder far away from the right UVJ. Both of these tumors were based on a narrow stalk. Resection the stalk resulted in complete resection and removal of each tumor. Pan cystoscopy was otherwise unremarkable. The tumors were approximately 5 cm each as estimated during cystoscopy.  He completed induction BCG for his high risk non-muscle invasive bladder cancer in August 2018  There were no vitals taken for this visit. NED. A&Ox3.   No respiratory distress   Abd soft, NT, ND Normal phallus with bilateral descended testicles  Cystoscopy Procedure Note  Patient identification was confirmed, informed consent was obtained, and patient was prepped using Betadine solution.  Lidocaine jelly was administered per urethral meatus.    Preoperative abx where received prior to procedure.     Pre-Procedure: - Inspection reveals a normal caliber ureteral meatus.  Procedure: The flexible cystoscope was introduced without difficulty - No urethral strictures/lesions are present. - Enlarged prostate  - Normal bladder neck - Bilateral ureteral orifices identified - Bladder mucosa  reveals no ulcers, tumors, or lesions - No bladder stones - No trabeculation  Retroflexion shows no intravesical lobe   Post-Procedure: - Patient tolerated the procedure well  Assessment/ Plan:  1. High risk non muscle invasive bladder cancer -Negative cystoscopy today -Follow up for repeat cystoscopy in 3 months.  He will be due for his first round of maintenance BCG at that time. Will need 3 years total.

## 2017-06-01 ENCOUNTER — Other Ambulatory Visit: Payer: Medicare Other

## 2017-06-11 ENCOUNTER — Ambulatory Visit: Admission: RE | Admit: 2017-06-11 | Payer: Medicare Other | Source: Ambulatory Visit | Admitting: Orthopedic Surgery

## 2017-06-11 ENCOUNTER — Encounter: Admission: RE | Payer: Self-pay | Source: Ambulatory Visit

## 2017-06-11 SURGERY — ARTHROSCOPY, KNEE
Anesthesia: Choice | Laterality: Left

## 2017-06-19 ENCOUNTER — Ambulatory Visit: Payer: Medicare Other | Admitting: Urology

## 2017-06-19 ENCOUNTER — Encounter: Payer: Self-pay | Admitting: Urology

## 2017-06-19 VITALS — BP 128/79 | HR 61 | Ht 69.0 in | Wt 201.7 lb

## 2017-06-19 DIAGNOSIS — C679 Malignant neoplasm of bladder, unspecified: Secondary | ICD-10-CM | POA: Diagnosis not present

## 2017-06-19 LAB — URINALYSIS, COMPLETE
Bilirubin, UA: NEGATIVE
Glucose, UA: NEGATIVE
Ketones, UA: NEGATIVE
Leukocytes, UA: NEGATIVE
Nitrite, UA: NEGATIVE
Protein, UA: NEGATIVE
Specific Gravity, UA: 1.025 (ref 1.005–1.030)
Urobilinogen, Ur: 0.2 mg/dL (ref 0.2–1.0)
pH, UA: 5.5 (ref 5.0–7.5)

## 2017-06-19 LAB — MICROSCOPIC EXAMINATION
Bacteria, UA: NONE SEEN
Epithelial Cells (non renal): NONE SEEN /hpf (ref 0–10)
WBC, UA: NONE SEEN /hpf (ref 0–?)

## 2017-06-19 MED ORDER — CIPROFLOXACIN HCL 500 MG PO TABS: 500.0000 mg | ORAL_TABLET | Freq: Once | ORAL | Status: DC

## 2017-06-19 MED ORDER — LIDOCAINE HCL 2 % EX GEL
1.0000 "application " | Freq: Once | CUTANEOUS | Status: AC
  Administered 2017-06-19: 1 via URETHRAL

## 2017-06-19 NOTE — Progress Notes (Signed)
   06/19/17  CC:  Chief Complaint  Patient presents with  . Cysto    HPI: The patient is an 84 year old gentleman presents for follow up cystoscopy cystoscopy. He underwent a TURBT for bladder tumor 2 that was found during a gross hematuria workup that was otherwise negative om May 2018. Pathology showed stagepTahigh-grade papillary urothelial carcinoma. The patient had 2 bladder tumors on the right lateral wall of his bladder far away from the right UVJ. Both of these tumors were based on a narrow stalk. Resection the stalk resulted in complete resection and removal of each tumor. Pan cystoscopy was otherwise unremarkable.The tumorswere approximately 5 cm each as estimated during cystoscopy.  He completed induction BCG for his high risk non-muscle invasive bladder cancer in August 2018  Blood pressure 128/79, pulse 61, height 5\' 9"  (1.753 m), weight 201 lb 11.2 oz (91.5 kg). NED. A&Ox3.   No respiratory distress   Abd soft, NT, ND Normal phallus with bilateral descended testicles  Cystoscopy Procedure Note  Patient identification was confirmed, informed consent was obtained, and patient was prepped using Betadine solution.  Lidocaine jelly was administered per urethral meatus.    Preoperative abx where received prior to procedure.     Pre-Procedure: - Inspection reveals a normal caliber ureteral meatus.  Procedure: The flexible cystoscope was introduced without difficulty - No urethral strictures/lesions are present. - Enlarged prostate  - Normal bladder neck - Bilateral ureteral orifices identified - Bladder mucosa  reveals no ulcers, tumors, or lesions - No bladder stones - No trabeculation  Retroflexion shows no intravesical lobe   Post-Procedure: - Patient tolerated the procedure well  Assessment/ Plan:   1. High risk non muscle invasive bladder cancer -Negative cystoscopy today -He is due for his first round of maintenance BCG at that time. Will need 3  years total. -Follow-up for cystoscopy 3 months after maintenance BCG

## 2017-07-06 IMAGING — DX DG KNEE 1-2V*L*
2 series · 2 of 2 positions shown · non-contrast
Comparison: CT scan of the knee February 05, 2016

CLINICAL DATA: Post- operative images following left total knee
joint replacement.

EXAM:
LEFT KNEE - 1-2 VIEW

[knee ap]
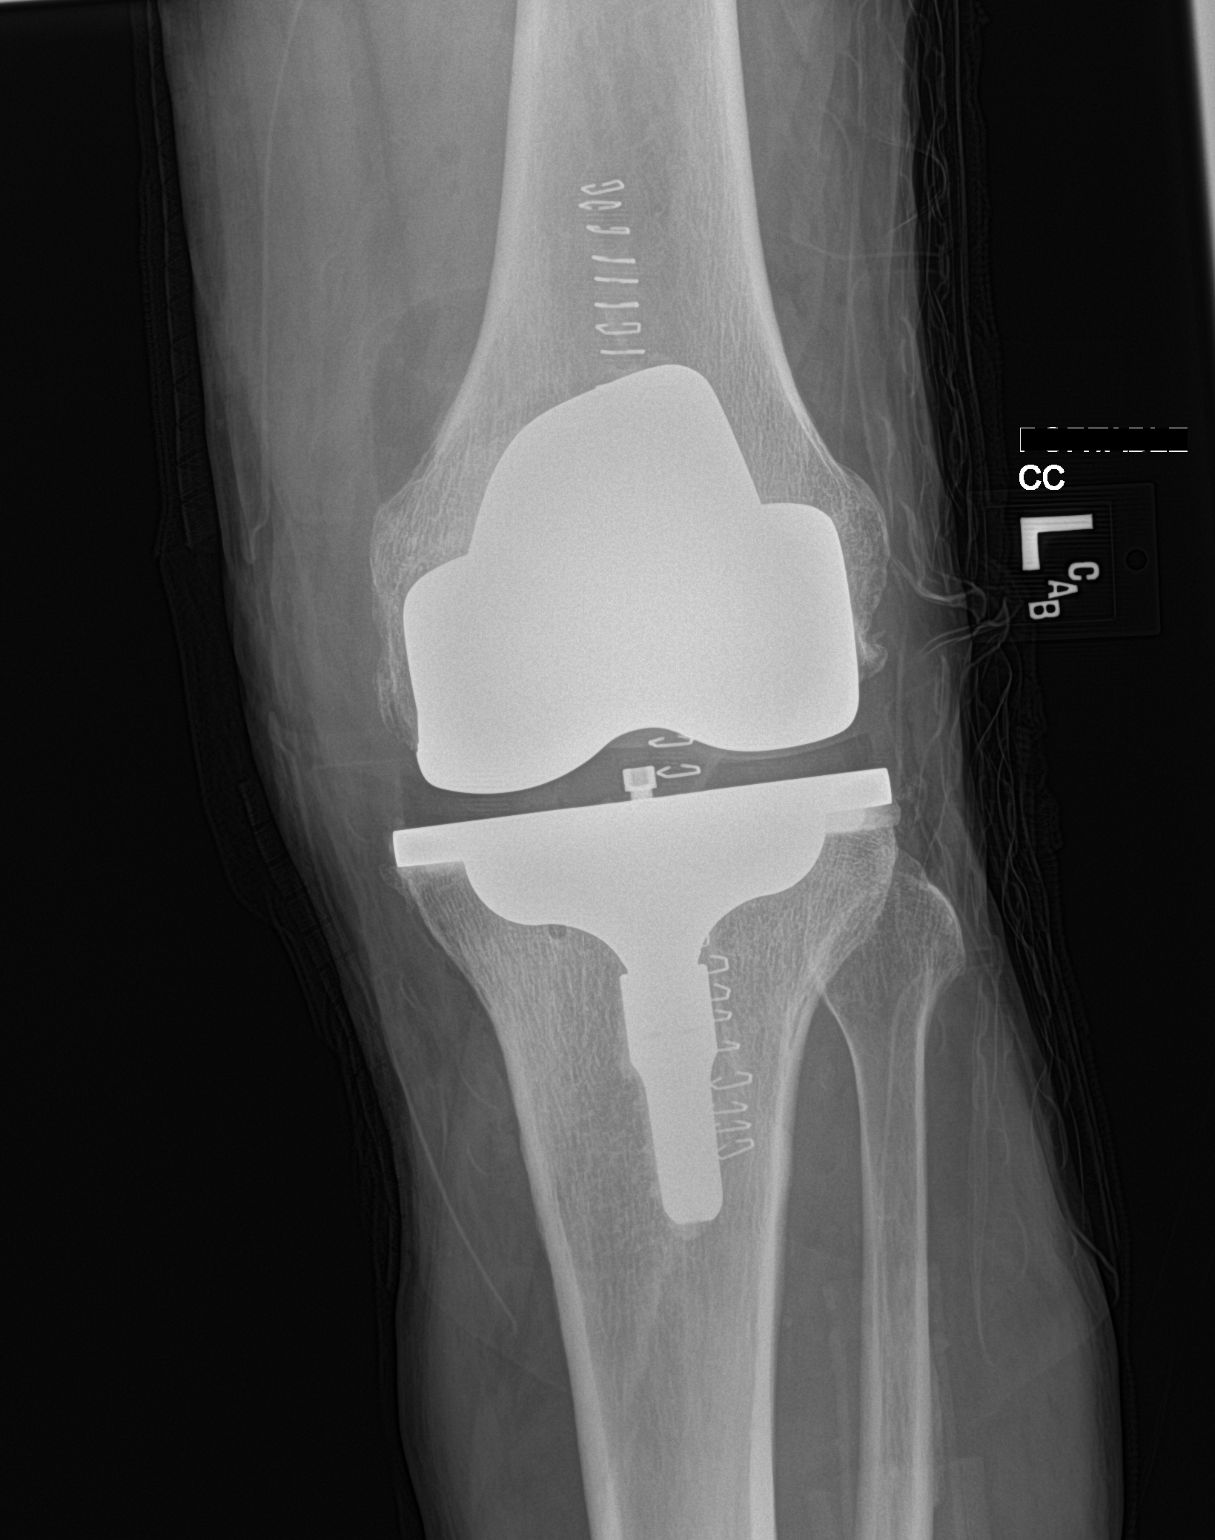

[knee lat]
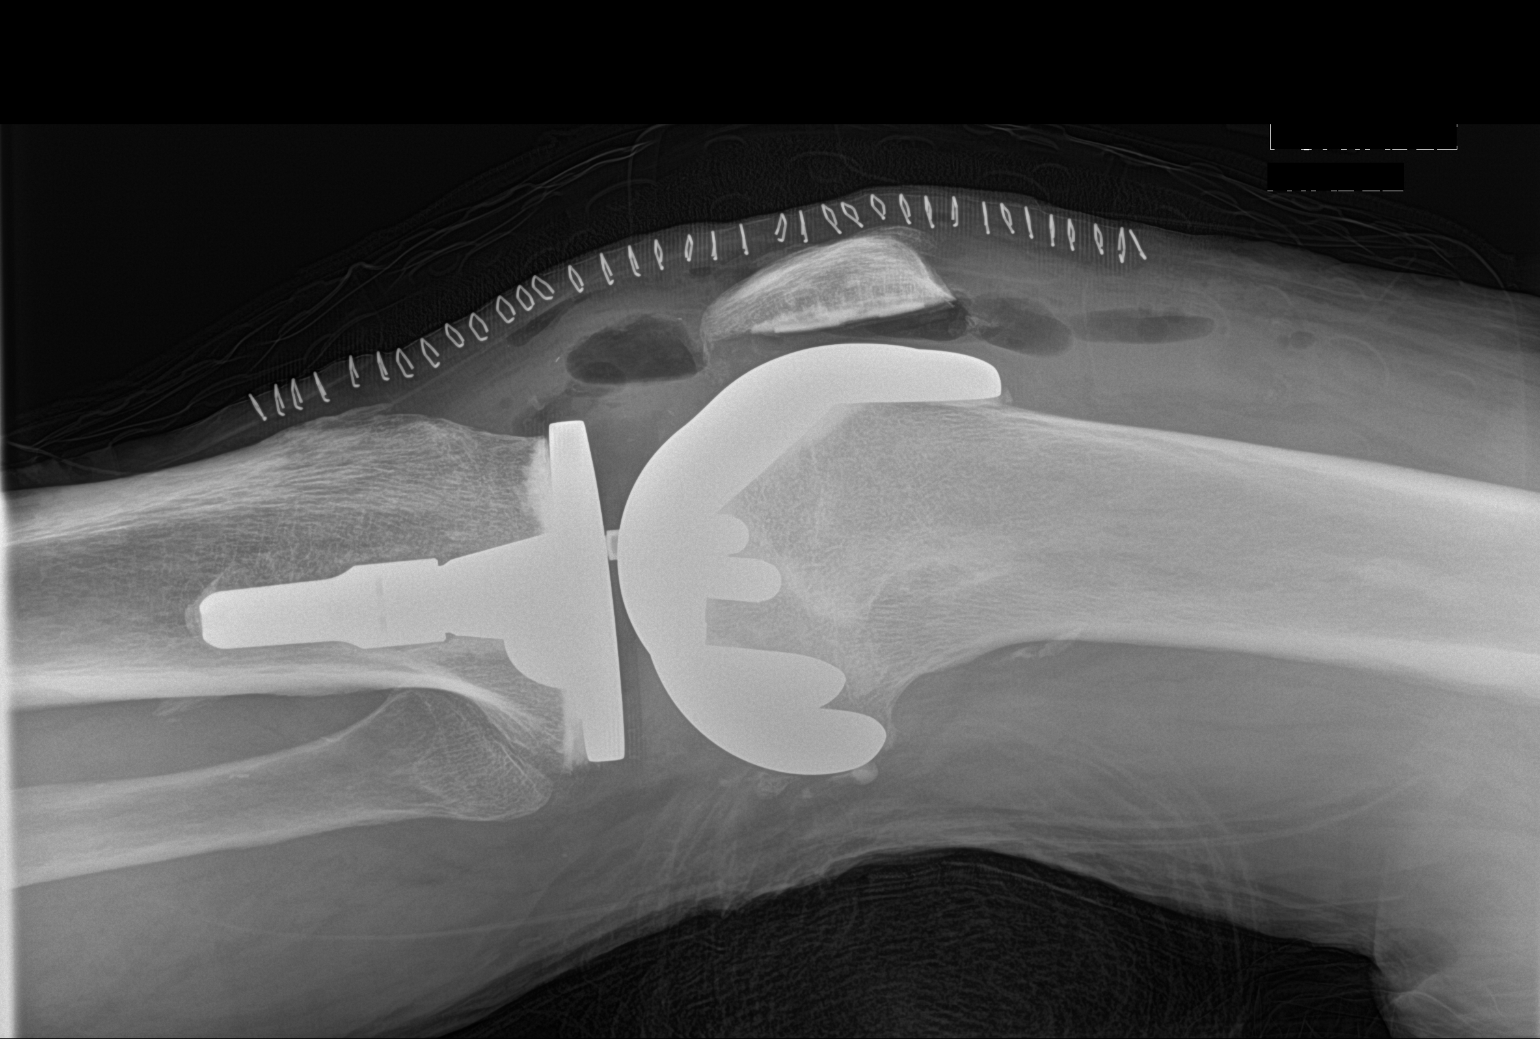

[2 of 2 positions shown; findings below may reference images not displayed]

FINDINGS: The prosthetic components appear appropriately positioned with
respect to the native bone. The interface appears normal. No acute
native bone abnormality is observed. There is air within the
anterior soft tissues. Surgical skin staples are present.
IMPRESSION: There is no acute postprocedure complication following left total
knee joint replacement.

## 2017-07-07 ENCOUNTER — Ambulatory Visit (INDEPENDENT_AMBULATORY_CARE_PROVIDER_SITE_OTHER): Payer: Medicare Other

## 2017-07-07 DIAGNOSIS — C679 Malignant neoplasm of bladder, unspecified: Secondary | ICD-10-CM | POA: Diagnosis not present

## 2017-07-07 LAB — URINALYSIS, COMPLETE
Bilirubin, UA: NEGATIVE
Glucose, UA: NEGATIVE
Ketones, UA: NEGATIVE
Leukocytes, UA: NEGATIVE
Nitrite, UA: NEGATIVE
Protein, UA: NEGATIVE
Specific Gravity, UA: 1.025 (ref 1.005–1.030)
Urobilinogen, Ur: 1 mg/dL (ref 0.2–1.0)
pH, UA: 5.5 (ref 5.0–7.5)

## 2017-07-07 LAB — MICROSCOPIC EXAMINATION
Bacteria, UA: NONE SEEN
Epithelial Cells (non renal): NONE SEEN /hpf (ref 0–10)
WBC, UA: NONE SEEN /hpf (ref 0–?)

## 2017-07-07 MED ORDER — BCG LIVE 50 MG IS SUSR
3.2400 mL | Freq: Once | INTRAVESICAL | Status: AC
  Administered 2017-07-07: 81 mg via INTRAVESICAL

## 2017-07-07 NOTE — Progress Notes (Signed)
BCG Bladder Instillation  BCG # 1  Due to Bladder Cancer patient is present today for a BCG treatment. Patient was cleaned and prepped in a sterile fashion with betadine and lidocaine 2% jelly was instilled into the urethra.  A 14FR catheter was inserted, urine return was noted 145ml, urine was yellow in color.  51ml of reconstituted BCG was instilled into the bladder. The catheter was then removed. Patient tolerated well, no complications were noted  Preformed by: Toniann Fail, LPN   Follow up/ Additional notes: Pt will RTC next week for #2.

## 2017-07-14 ENCOUNTER — Ambulatory Visit: Payer: Medicare Other

## 2017-07-15 ENCOUNTER — Ambulatory Visit (INDEPENDENT_AMBULATORY_CARE_PROVIDER_SITE_OTHER): Payer: Medicare Other

## 2017-07-15 DIAGNOSIS — C679 Malignant neoplasm of bladder, unspecified: Secondary | ICD-10-CM | POA: Diagnosis not present

## 2017-07-15 LAB — URINALYSIS, COMPLETE
Bilirubin, UA: NEGATIVE
Glucose, UA: NEGATIVE
Ketones, UA: NEGATIVE
Leukocytes, UA: NEGATIVE
Nitrite, UA: NEGATIVE
Protein, UA: NEGATIVE
Specific Gravity, UA: 1.025 (ref 1.005–1.030)
Urobilinogen, Ur: 0.2 mg/dL (ref 0.2–1.0)
pH, UA: 5.5 (ref 5.0–7.5)

## 2017-07-15 LAB — MICROSCOPIC EXAMINATION: WBC, UA: NONE SEEN /hpf (ref 0–?)

## 2017-07-15 MED ORDER — BCG LIVE 50 MG IS SUSR
3.2400 mL | Freq: Once | INTRAVESICAL | Status: AC
  Administered 2017-07-15: 81 mg via INTRAVESICAL

## 2017-07-15 NOTE — Progress Notes (Signed)
BCG Bladder Instillation  BCG # 2  Due to Bladder Cancer patient is present today for a BCG treatment. Patient was cleaned and prepped in a sterile fashion with betadine and lidocaine 2% jelly was instilled into the urethra.  A 14FR catheter was inserted, urine return was noted 68ml, urine was dark yellow in color.  75ml of reconstituted BCG( 1/3 dose was given due to national back shortage) was instilled into the bladder. The catheter was then removed. Patient tolerated well, no complications were noted  Preformed by: Fonnie Jarvis, CMA and Toniann Fail, LPN  Follow up/ Additional notes: next week

## 2017-07-21 ENCOUNTER — Ambulatory Visit: Payer: Medicare Other

## 2017-07-22 ENCOUNTER — Ambulatory Visit: Payer: Medicare Other

## 2017-07-22 DIAGNOSIS — C679 Malignant neoplasm of bladder, unspecified: Secondary | ICD-10-CM

## 2017-07-22 LAB — URINALYSIS, COMPLETE
Bilirubin, UA: NEGATIVE
Glucose, UA: NEGATIVE
Nitrite, UA: POSITIVE — AB
Specific Gravity, UA: 1.02 (ref 1.005–1.030)
Urobilinogen, Ur: 0.2 mg/dL (ref 0.2–1.0)
pH, UA: 5 (ref 5.0–7.5)

## 2017-07-22 LAB — MICROSCOPIC EXAMINATION
Epithelial Cells (non renal): NONE SEEN /hpf (ref 0–10)
RBC, UA: NONE SEEN /hpf (ref 0–?)

## 2017-07-22 NOTE — Progress Notes (Unsigned)
UA was sent for culture , BCG was rescheduled patient will be contacted with culture results

## 2017-07-24 ENCOUNTER — Telehealth: Payer: Self-pay

## 2017-07-24 LAB — CULTURE, URINE COMPREHENSIVE

## 2017-07-24 NOTE — Telephone Encounter (Signed)
Pt daughter called inquiring about why pt did not receive BCG treatment this week. Reinforced with daughter that pt u/a was suspicious for an infection therefore urine was sent for cx. Reinforced with daughter that if BCG was given and pt did have an infection it could make pt very sick. Daughter stated that all pt heard at appt was "could kill you" and pt has been very upset. Reinforced with daughter that if BCG was given with an infection present that could possibly happen. Daughter voiced understanding of whole conversation and stated she would talk with pt today. Reinforced with daughter would call with ucx results.

## 2017-07-27 ENCOUNTER — Telehealth: Payer: Self-pay

## 2017-07-27 MED ORDER — SULFAMETHOXAZOLE-TRIMETHOPRIM 800-160 MG PO TABS: 1.0000 | ORAL_TABLET | Freq: Two times a day (BID) | ORAL | 0 refills | Status: AC

## 2017-07-27 NOTE — Telephone Encounter (Signed)
Spoke with pt daughter in reference to +ucx and abx. Pt voiced understanding.

## 2017-07-27 NOTE — Telephone Encounter (Signed)
-----   Message from Hollice Espy, MD sent at 07/26/2017  3:03 PM EDT ----- Lets treat with bactrim ds bid x 7 days.  Hollice Espy, MD

## 2017-07-30 ENCOUNTER — Ambulatory Visit (INDEPENDENT_AMBULATORY_CARE_PROVIDER_SITE_OTHER): Payer: Medicare Other

## 2017-07-30 DIAGNOSIS — C678 Malignant neoplasm of overlapping sites of bladder: Secondary | ICD-10-CM

## 2017-07-30 LAB — URINALYSIS, COMPLETE
Bilirubin, UA: NEGATIVE
Glucose, UA: NEGATIVE
Ketones, UA: NEGATIVE
Leukocytes, UA: NEGATIVE
Nitrite, UA: NEGATIVE
Protein, UA: NEGATIVE
Specific Gravity, UA: 1.02 (ref 1.005–1.030)
Urobilinogen, Ur: 1 mg/dL (ref 0.2–1.0)
pH, UA: 6 (ref 5.0–7.5)

## 2017-07-30 MED ORDER — BCG LIVE 50 MG IS SUSR
3.2400 mL | Freq: Once | INTRAVESICAL | Status: AC
  Administered 2017-07-30: 81 mg via INTRAVESICAL

## 2017-07-30 NOTE — Progress Notes (Signed)
BCG Bladder Instillation  BCG # 3- half dose  Due to Bladder Cancer patient is present today for a BCG treatment. Patient was cleaned and prepped in a sterile fashion with betadine and lidocaine 2% jelly was instilled into the urethra.  A 14FR catheter was inserted, urine return was noted 90ml, urine was yellow in color.  29ml of reconstituted BCG was instilled into the bladder. The catheter was then removed. Patient tolerated well, no complications were noted  Preformed by: Toniann Fail, LPN, Fonnie Jarvis, CMA   Follow up/ Additional notes: 3 months

## 2017-10-22 ENCOUNTER — Ambulatory Visit (INDEPENDENT_AMBULATORY_CARE_PROVIDER_SITE_OTHER): Payer: Medicare Other | Admitting: Urology

## 2017-10-22 ENCOUNTER — Encounter: Payer: Self-pay | Admitting: Urology

## 2017-10-22 VITALS — BP 125/72 | HR 51 | Ht 69.0 in | Wt 198.3 lb

## 2017-10-22 DIAGNOSIS — C678 Malignant neoplasm of overlapping sites of bladder: Secondary | ICD-10-CM | POA: Diagnosis not present

## 2017-10-22 LAB — URINALYSIS, COMPLETE
Bilirubin, UA: NEGATIVE
Glucose, UA: NEGATIVE
Ketones, UA: NEGATIVE
Leukocytes, UA: NEGATIVE
Nitrite, UA: NEGATIVE
Protein, UA: NEGATIVE
RBC, UA: NEGATIVE
Specific Gravity, UA: 1.02 (ref 1.005–1.030)
Urobilinogen, Ur: 1 mg/dL (ref 0.2–1.0)
pH, UA: 6 (ref 5.0–7.5)

## 2017-10-22 MED ORDER — LIDOCAINE HCL URETHRAL/MUCOSAL 2 % EX GEL
1.0000 "application " | Freq: Once | CUTANEOUS | Status: AC
  Administered 2017-10-22: 1 via URETHRAL

## 2017-10-22 MED ORDER — CIPROFLOXACIN HCL 500 MG PO TABS
500.0000 mg | ORAL_TABLET | Freq: Once | ORAL | Status: AC
  Administered 2017-10-22: 500 mg via ORAL

## 2017-10-22 NOTE — Progress Notes (Signed)
   10/22/17  CC:  Chief Complaint  Patient presents with  . Cysto    HPI:  The patient is an 84 year old gentleman presentsfor follow up cystoscopy. He underwenta TURBT for bladder tumor 2 that was found during agross hematuria workup that was otherwise negative May 2018. Pathology showed stagepTahigh-grade papillary urothelial carcinoma. The patient had 2 bladder tumors on the right lateral wall of his bladder far away from the right UVJ. Both of these tumorswere based on a narrow stalk. Resection the stalk resulted incomplete resection and removal of each tumor. Pan cystoscopy was otherwise unremarkable.The tumorswere approximately5 cm each as estimated during cystoscopy. -Last cysto: Feb 2019 -Last BCG: Mar 2019 maintenance; Aug 2018 induction -Last upper tract: CT 09/01/2016  He is well today. No dysuria or gross hematuria.   Blood pressure 125/72, pulse (!) 51, weight 89.9 kg (198 lb 4.8 oz). NED. A&Ox3.   No respiratory distress   Abd soft, NT, ND Normal phallus with bilateral descended testicles  Cystoscopy Procedure Note  Patient identification was confirmed, informed consent was obtained, and patient was prepped using Betadine solution.  Lidocaine jelly was administered per urethral meatus.    Preoperative abx where received prior to procedure.     Pre-Procedure: - Inspection reveals a normal caliber ureteral meatus.  Procedure: The flexible cystoscope was introduced without difficulty - No urethral strictures/lesions are present. - Prostate borderline obstructive - bladder neck patent  - Bilateral ureteral orifices identified - Bladder mucosa  reveals no ulcers, tumors, or lesions - No bladder stones - No trabeculation  Retroflexion shows normal bladder/bladder neck   Post-Procedure: - Patient tolerated the procedure well  Assessment/ Plan:  Bladder ca - NED. See in 3 mo for cystoscopy and then BCG maintenance.

## 2017-12-20 IMAGING — CT CT ABD-PEL WO/W CM
2 of 6 series · 12 of 32 positions shown, 17 images · IV contrast (APPLIED)
Comparison: CT 06/10/2011 with

CLINICAL DATA: Painless hematuria 3 weeks prior.

EXAM:
CT ABDOMEN AND PELVIS WITHOUT AND WITH CONTRAST
TECHNIQUE: Multidetector CT imaging of the abdomen and pelvis was performed
following the standard protocol before and following the bolus
administration of intravenous contrast.
CONTRAST:  125mL [id number]-J22 IOPAMIDOL ([id number]-J22) INJECTION 61%

[Series 4: axial post · axial · 0.75mm/px · z∈[-878,-523]mm · 6 of 101 slices shown]
[im 15/101  soft-tissue]
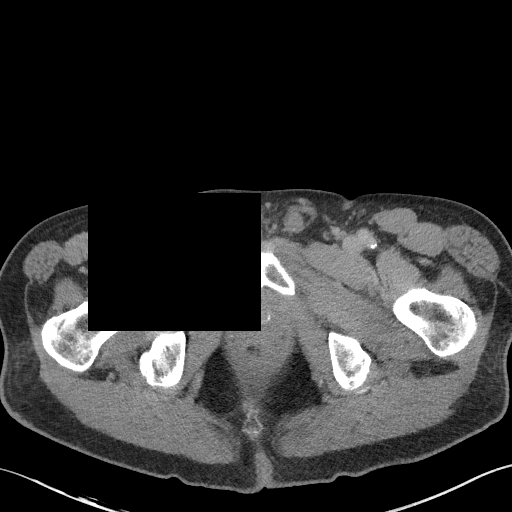
[im 29/101  soft-tissue]
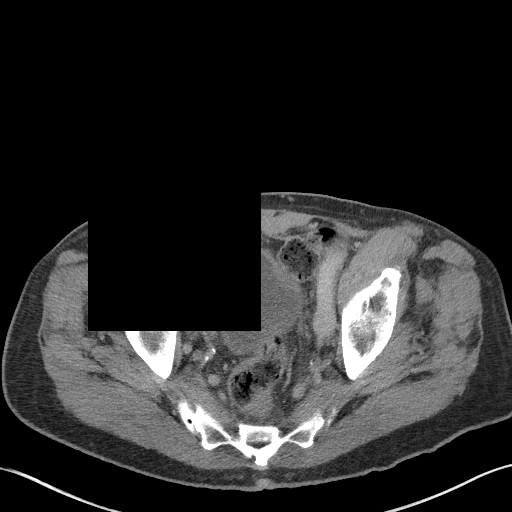
[im 43/101  soft-tissue]
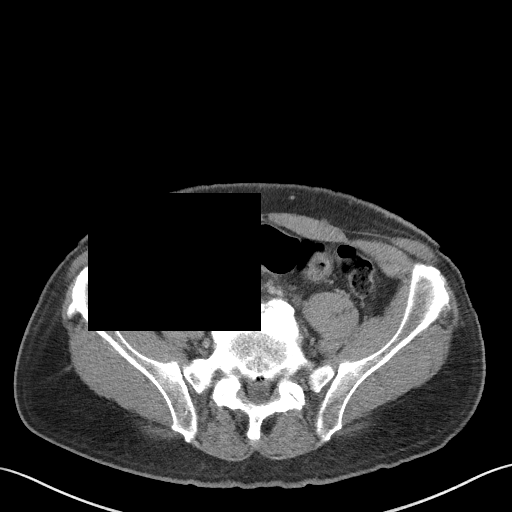
[im 58/101  soft-tissue]
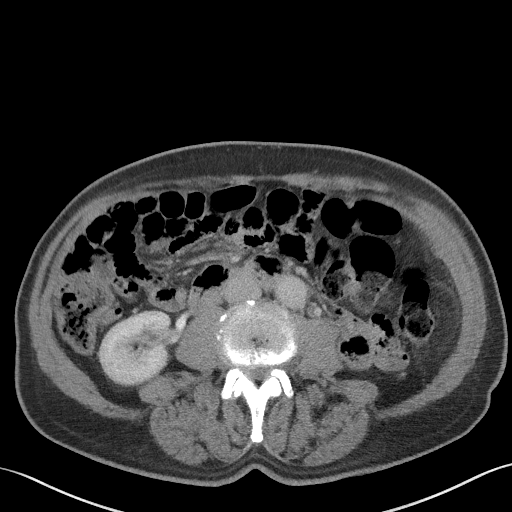
[im 72/101  soft-tissue]
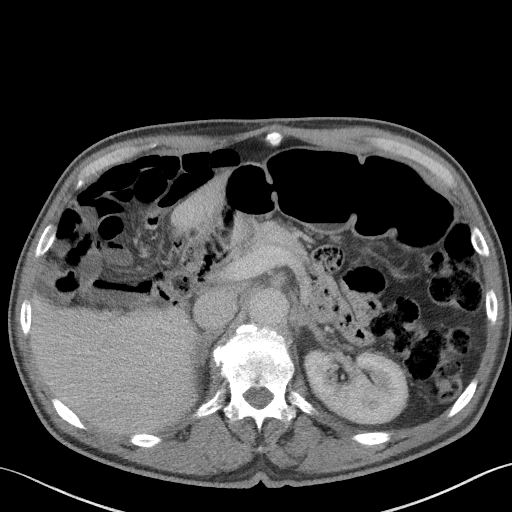
[im 86/101  soft-tissue]
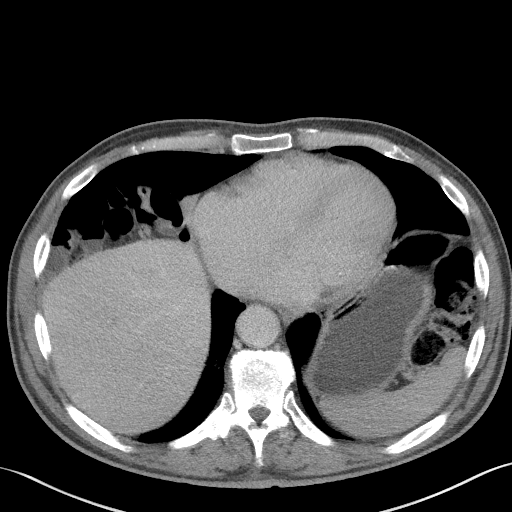

[Series 13: axial delay · axial · delayed · 0.75mm/px · z∈[-878,-503]mm · 6 of 105 slices shown, 11 images]
[im 15/105  soft-tissue]
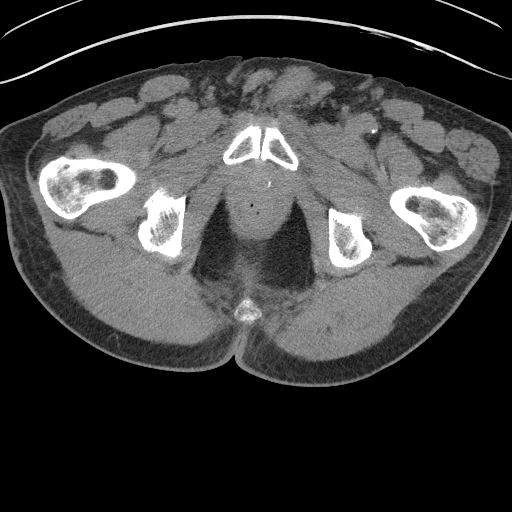
[im 15/105  bone]
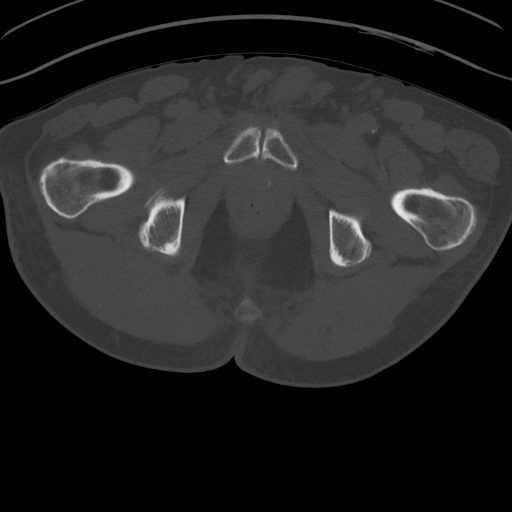
[im 30/105  soft-tissue]
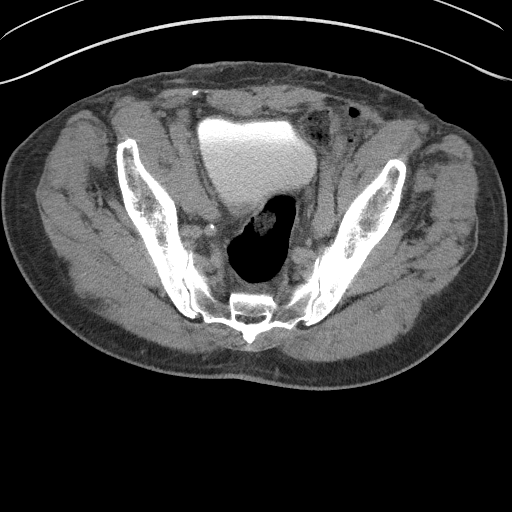
[im 45/105  soft-tissue]
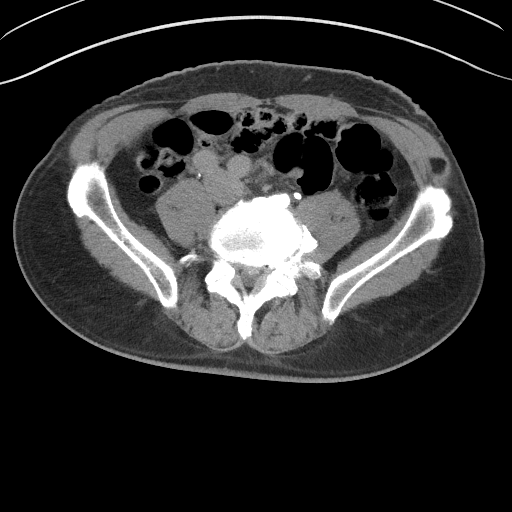
[im 45/105  lung]
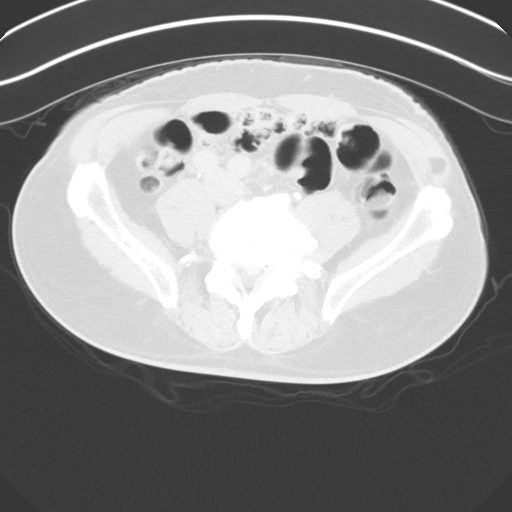
[im 60/105  soft-tissue]
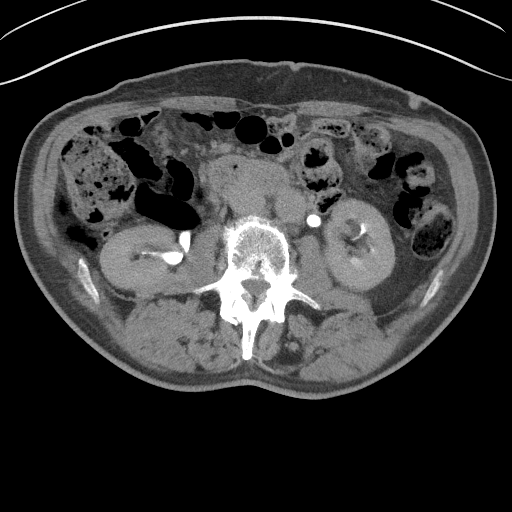
[im 60/105  lung]
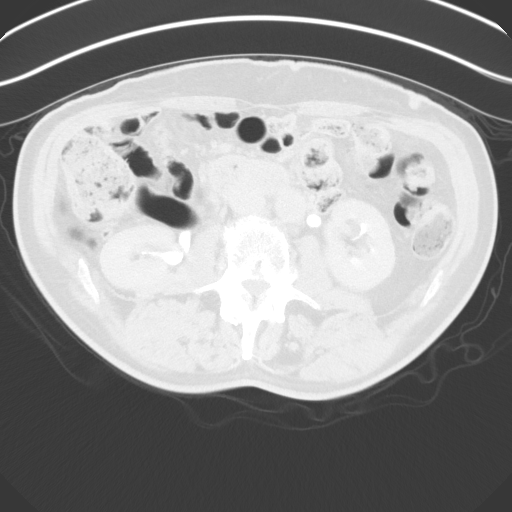
[im 75/105  soft-tissue]
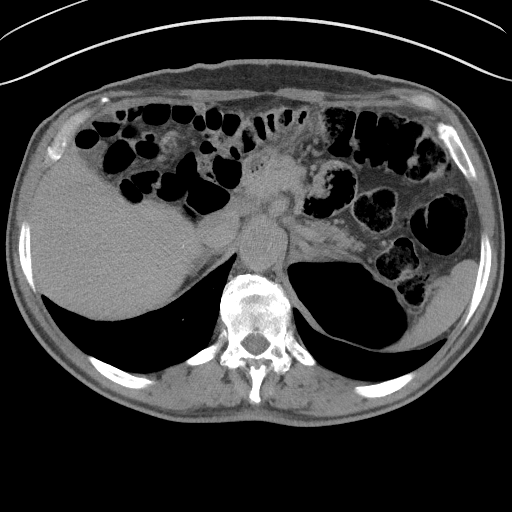
[im 75/105  lung]
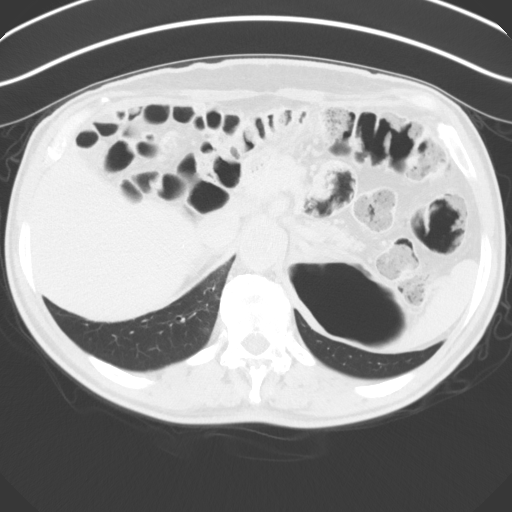
[im 90/105  soft-tissue]
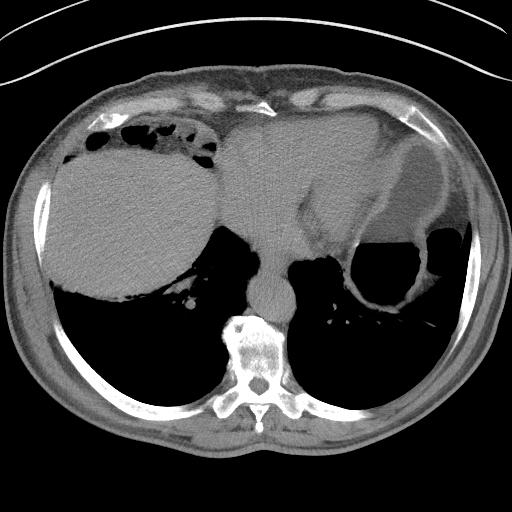
[im 90/105  lung]
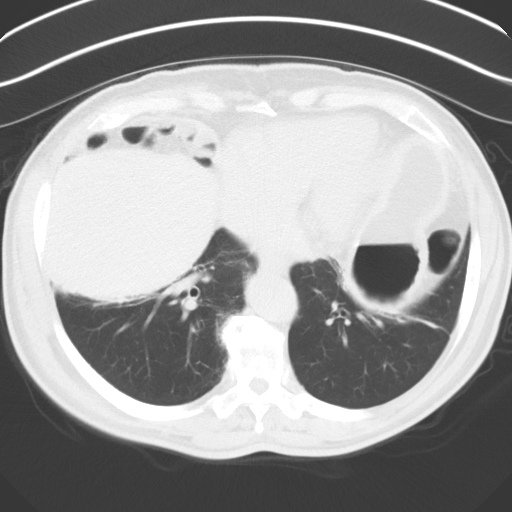

[12 of 32 positions shown; findings below may reference images not displayed]

FINDINGS: Lower chest: Mild atelectasis the lung bases.  No nodularity

Hepatobiliary: Several tiny hypodensities in the liver appear
benign. Normal gallbladder.

Pancreas: Pancreas is normal. No ductal dilatation. No pancreatic
inflammation.

Spleen: Normal spleen

Adrenals/urinary tract: Adrenal glands are normal. No
nephrolithiasis or ureterolithiasis. No enhancing renal cortical
lesion. No filling defect within the collecting systems or ureters.
Proximal ureters.

There is a lobular mass along the posterior RIGHT aspect of the
bladder in the vicinity of the RIGHT vesicoureteral junction. This
lobular lesion has frondlike projections measuring 2.3 x 2.5 cm
(image 76, series 4). There several speckled calcifications within
this lesion. Lesion demonstrates mild post-contrast enhancement.

Stomach/Bowel: Stomach, small bowel, appendix, and cecum are normal.
The colon and rectosigmoid colon are normal.

Vascular/Lymphatic: Abdominal aorta is normal caliber. There is no
retroperitoneal or periportal lymphadenopathy. No pelvic
lymphadenopathy.

Reproductive: Prostate normal.

Other: No free fluid.

Musculoskeletal: Sclerotic lesion in the LEFT iliac bone measuring 5
mm this not changed from prior.
IMPRESSION: 1. Lobular enhancing mass within the posterior RIGHT aspect of the
bladder is concerning for bladder carcinoma or uroepithelial
carcinoma. Lesion at the RIGHT vesicoureteral junction. No evidence
obstruction.
2. No renal lesion.
These results will be called to the ordering clinician or
representative by the Radiologist Assistant, and communication
documented in the PACS or zVision Dashboard.

## 2018-01-21 ENCOUNTER — Other Ambulatory Visit: Payer: Medicare Other

## 2018-02-01 ENCOUNTER — Ambulatory Visit (INDEPENDENT_AMBULATORY_CARE_PROVIDER_SITE_OTHER): Payer: Medicare Other | Admitting: Urology

## 2018-02-01 ENCOUNTER — Encounter: Payer: Self-pay | Admitting: Urology

## 2018-02-01 ENCOUNTER — Other Ambulatory Visit: Payer: Self-pay

## 2018-02-01 VITALS — BP 137/72 | HR 48 | Ht 69.0 in | Wt 199.0 lb

## 2018-02-01 DIAGNOSIS — C678 Malignant neoplasm of overlapping sites of bladder: Secondary | ICD-10-CM

## 2018-02-01 LAB — URINALYSIS, COMPLETE
Bilirubin, UA: NEGATIVE
Glucose, UA: NEGATIVE
Ketones, UA: NEGATIVE
Leukocytes, UA: NEGATIVE
Nitrite, UA: NEGATIVE
Protein, UA: NEGATIVE
Specific Gravity, UA: >1.03 — ABNORMAL HIGH (ref 1.005–1.030)
Urobilinogen, Ur: 1 mg/dL (ref 0.2–1.0)
pH, UA: 5.5 (ref 5.0–7.5)

## 2018-02-01 LAB — MICROSCOPIC EXAMINATION
Bacteria, UA: NONE SEEN
WBC, UA: NONE SEEN /hpf (ref 0–5)

## 2018-02-01 MED ORDER — OXYBUTYNIN CHLORIDE ER 10 MG PO TB24: 10.0000 mg | ORAL_TABLET | Freq: Every day | ORAL | 11 refills | Status: DC

## 2018-02-01 NOTE — Progress Notes (Signed)
Bladder cancer surveillance note  INDICATION History of bladder cancer  UROLOGIC HISTORY Travis [name] is a 84 y.o. male s/p TURBT 09/2016 with Dr. Pilar Jarvis for large >6cm HG Ta UCC bladder tumors.   Initial Diagnosis of Bladder  Year: 09/2016  Pathology: HG Ta  Treatments for Bladder Cancer TURBT 09/2016 BCG induction 12/2016, maintenance 07/2017  AUA Risk Category High risk  Cystoscopy Procedure Note: After informed consent and discussion of the procedure and its risks, Travis [name] was positioned and prepped in the standard fashion. Cystoscopy was performed with the a flexible cystoscope. The urethra, bladder neck and entire bladder was visualized in a standard fashion, and the mucosa was grossly normal throughout. The ureteral orifices were visualized in their normal location and orientation. Cytology sent.   PLAN: 1. Proceed with maintenance BCG 2. Surveillance cysto in 3 months 3. Upper tract imaging due 08/2018 4. Trial of ditropan XL for severe urgency  Nickolas Madrid, MD 02/01/2018

## 2018-02-04 ENCOUNTER — Other Ambulatory Visit: Payer: Self-pay | Admitting: Urology

## 2018-02-18 ENCOUNTER — Telehealth: Payer: Self-pay

## 2018-02-18 NOTE — Telephone Encounter (Signed)
Left pt mess to call to schedule maint BCG . Split dose

## 2018-02-26 ENCOUNTER — Ambulatory Visit (INDEPENDENT_AMBULATORY_CARE_PROVIDER_SITE_OTHER): Payer: Medicare Other | Admitting: Family Medicine

## 2018-02-26 DIAGNOSIS — C678 Malignant neoplasm of overlapping sites of bladder: Secondary | ICD-10-CM

## 2018-02-26 LAB — URINALYSIS, COMPLETE
Bilirubin, UA: NEGATIVE
Glucose, UA: NEGATIVE
Ketones, UA: NEGATIVE
Leukocytes, UA: NEGATIVE
Nitrite, UA: NEGATIVE
Protein, UA: NEGATIVE
Specific Gravity, UA: 1.02 (ref 1.005–1.030)
Urobilinogen, Ur: 1 mg/dL (ref 0.2–1.0)
pH, UA: 5.5 (ref 5.0–7.5)

## 2018-02-26 MED ORDER — BCG LIVE 50 MG IS SUSR
0.3000 mL | Freq: Once | INTRAVESICAL | Status: AC
  Administered 2018-02-26: 7.5 mg via INTRAVESICAL

## 2018-02-26 NOTE — Addendum Note (Signed)
Addended by: Kyra Manges on: 02/26/2018 02:45 PM   Modules accepted: Orders, Level of Service

## 2018-02-26 NOTE — Progress Notes (Signed)
BCG Bladder Instillation  BCG # 1  Due to Bladder Cancer patient is present today for a BCG treatment. Patient was cleaned and prepped in a sterile fashion with betadine and lidocaine 2% jelly was instilled into the urethra.  A 14FR catheter was inserted, urine return was noted 67ml, urine was yellow in color.  33ml of reconstituted BCG was instilled into the bladder. The catheter was then removed. Patient tolerated well, no complications were noted  Preformed by: Elberta Leatherwood, CMA  Follow up/ Additional notes: 1 week

## 2018-03-05 ENCOUNTER — Ambulatory Visit: Payer: Medicare Other | Admitting: Family Medicine

## 2018-03-05 DIAGNOSIS — C678 Malignant neoplasm of overlapping sites of bladder: Secondary | ICD-10-CM

## 2018-03-05 LAB — MICROSCOPIC EXAMINATION

## 2018-03-05 LAB — URINALYSIS, COMPLETE
Bilirubin, UA: NEGATIVE
Glucose, UA: NEGATIVE
Ketones, UA: NEGATIVE
Leukocytes, UA: NEGATIVE
Nitrite, UA: NEGATIVE
Protein, UA: NEGATIVE
Specific Gravity, UA: 1.025 (ref 1.005–1.030)
Urobilinogen, Ur: 0.2 mg/dL (ref 0.2–1.0)
pH, UA: 5.5 (ref 5.0–7.5)

## 2018-03-12 ENCOUNTER — Ambulatory Visit: Payer: Medicare Other

## 2018-03-12 DIAGNOSIS — C678 Malignant neoplasm of overlapping sites of bladder: Secondary | ICD-10-CM

## 2018-03-12 LAB — MICROSCOPIC EXAMINATION: RBC, UA: NONE SEEN /hpf (ref 0–2)

## 2018-03-12 LAB — URINALYSIS, COMPLETE
Bilirubin, UA: NEGATIVE
Glucose, UA: NEGATIVE
Ketones, UA: NEGATIVE
Leukocytes, UA: NEGATIVE
Nitrite, UA: NEGATIVE
Protein, UA: NEGATIVE
Specific Gravity, UA: 1.025 (ref 1.005–1.030)
Urobilinogen, Ur: 0.2 mg/dL (ref 0.2–1.0)
pH, UA: 5 (ref 5.0–7.5)

## 2018-05-13 ENCOUNTER — Other Ambulatory Visit: Payer: Medicare Other | Admitting: Urology

## 2018-06-04 ENCOUNTER — Other Ambulatory Visit: Payer: Medicare Other | Admitting: Urology

## 2018-06-04 ENCOUNTER — Encounter: Payer: Self-pay | Admitting: Urology

## 2018-06-04 ENCOUNTER — Ambulatory Visit: Payer: Medicare Other | Admitting: Urology

## 2018-06-04 VITALS — BP 115/81 | HR 70 | Ht 69.0 in | Wt 198.0 lb

## 2018-06-04 DIAGNOSIS — Z8551 Personal history of malignant neoplasm of bladder: Secondary | ICD-10-CM | POA: Diagnosis not present

## 2018-06-04 DIAGNOSIS — R310 Gross hematuria: Secondary | ICD-10-CM | POA: Diagnosis not present

## 2018-06-04 LAB — URINALYSIS, COMPLETE
Bilirubin, UA: NEGATIVE
Glucose, UA: NEGATIVE
Ketones, UA: NEGATIVE
Leukocytes, UA: NEGATIVE
Nitrite, UA: NEGATIVE
Protein, UA: NEGATIVE
RBC, UA: NEGATIVE
Specific Gravity, UA: 1.015 (ref 1.005–1.030)
Urobilinogen, Ur: 1 mg/dL (ref 0.2–1.0)
pH, UA: 6 (ref 5.0–7.5)

## 2018-06-04 NOTE — Progress Notes (Signed)
   06/04/18  CC:  Chief Complaint  Patient presents with  . Cysto    Bladder cancer surveillance note  INDICATION History of bladder cancer. He noted gross hematuria for a few days after his last BCG. Urine red, no clots. Symptoms resolved after a few days.   UROLOGIC HISTORY Travis [name] is a 85 y.o. male s/p TURBT 09/2016 with Dr. Pilar Jarvis for large >6cm HG Ta UCC bladder tumors.   Initial Diagnosis of Bladder Year: 09/2016  Pathology: HG Ta  Treatments for Bladder Cancer TURBT 09/2016 BCG induction 12/2016, maintenance 07/2017, maintenance 01/2018  Upper tract imaging CT 09/01/2016  AUA Risk Category High risk  There were no vitals taken for this visit. NED. A&Ox3.   No respiratory distress   Abd soft, NT, ND Normal phallus with bilateral descended testicles  Cystoscopy Procedure Note  Patient identification was confirmed, informed consent was obtained, and patient was prepped using Betadine solution.  Lidocaine jelly was administered per urethral meatus.     Pre-Procedure: - Inspection reveals a normal caliber ureteral meatus.  Procedure: The flexible cystoscope was introduced without difficulty - No urethral strictures/lesions are present. - normal prostate mild obstruction  - normal bladder neck - Bilateral ureteral orifices identified - Bladder mucosa reveals no ulcers, tumors, or lesions - No bladder stones - No trabeculation  Retroflexion shows normal bladder/bladder neck   Post-Procedure: - Patient tolerated the procedure well  Assessment/ Plan: Bladder ca - BCG x 3 maintenance March 2020, cystoscopy July 2020   Gross hematuria - ?related to BCG/in and out catheter. Will repeat a CT as it's been a couple of years.   No follow-ups on file.  Festus Aloe, MD

## 2018-06-09 ENCOUNTER — Other Ambulatory Visit: Payer: Medicare Other | Admitting: Urology

## 2018-07-21 ENCOUNTER — Other Ambulatory Visit: Payer: Self-pay

## 2018-07-21 ENCOUNTER — Ambulatory Visit
Admission: RE | Admit: 2018-07-21 | Discharge: 2018-07-21 | Disposition: A | Discharge status: Home / Self Care | Payer: Medicare Other | Source: Ambulatory Visit | Attending: Urology | Admitting: Urology

## 2018-07-21 DIAGNOSIS — R310 Gross hematuria: Secondary | ICD-10-CM | POA: Diagnosis present

## 2018-07-21 DIAGNOSIS — Z8551 Personal history of malignant neoplasm of bladder: Secondary | ICD-10-CM | POA: Diagnosis not present

## 2018-07-21 LAB — POCT I-STAT CREATININE: Creatinine, Ser: 0.9 mg/dL (ref 0.61–1.24)

## 2018-07-21 MED ORDER — IOHEXOL 300 MG/ML  SOLN
125.0000 mL | Freq: Once | INTRAMUSCULAR | Status: AC | PRN
  Administered 2018-07-21: 125 mL via INTRAVENOUS

## 2018-07-28 ENCOUNTER — Telehealth: Payer: Self-pay | Admitting: Urology

## 2018-07-28 ENCOUNTER — Ambulatory Visit: Payer: Medicare Other | Admitting: Urology

## 2018-07-28 NOTE — Telephone Encounter (Signed)
Spoke with patient's daughter and made her aware of results Cancelled appt for 09-08-18 and we will call back to schedule the BCG and 3 month cysto app  Carlis Stable patient will need BCG Please review   Sharyn Lull

## 2018-07-28 NOTE — Telephone Encounter (Signed)
Patient had his CT scan last week and his follow up is on 09-08-18 does he need to keep this app or can he be called with results? We are getting him scheduled for his BCG treatments. Waiting on the schedule from [place].   Sharyn Lull

## 2018-07-29 ENCOUNTER — Telehealth: Payer: Self-pay

## 2018-07-29 NOTE — Telephone Encounter (Signed)
Patient's daughter was notified that due to the CoVid19 precautions we are temporarily stopping BCG treatments due to possible immune compromising and PPE shortage. Patient is in agreement and verbalized understanding. His appointments were cancelled and he was placed on a recall list

## 2018-09-08 ENCOUNTER — Ambulatory Visit: Payer: Medicare Other | Admitting: Urology

## 2018-10-18 ENCOUNTER — Ambulatory Visit: Payer: Self-pay | Admitting: Surgery

## 2018-10-25 ENCOUNTER — Other Ambulatory Visit: Payer: Self-pay

## 2018-10-25 ENCOUNTER — Encounter: Payer: Self-pay | Admitting: Surgery

## 2018-10-25 ENCOUNTER — Encounter: Payer: Self-pay | Admitting: *Deleted

## 2018-10-25 ENCOUNTER — Ambulatory Visit: Payer: Medicare Other | Admitting: Surgery

## 2018-10-25 VITALS — BP 131/82 | HR 51 | Temp 97.7°F | Resp 16 | Ht 69.0 in | Wt 200.0 lb

## 2018-10-25 DIAGNOSIS — K4091 Unilateral inguinal hernia, without obstruction or gangrene, recurrent: Secondary | ICD-10-CM

## 2018-10-25 NOTE — Progress Notes (Signed)
Outpatient Surgical Follow Up  10/25/2018  Travis [name] is an 85 y.o. male.   Chief Complaint  Patient presents with  . Follow-up    left inguinal hernia on 01/03/16, having pain    HPI: Travis [name] is an 85 year old male status post laparoscopic TEP repair by me on August 2017.  Over the last few months he has experienced new moderate left inguinal pain, sharp and intermittent with a bulge.  No history of incarceration or strangulation.  No fevers no chills.  He does have chronic left knee pain and had a replacement 3 years ago.  He is able to perform more than 4 METS of activity without any shortness of breath or chest pain.  He is being treated for bladder cancer with BCG and now is on hold due to the COVID 19 pandemic.  Also have a right inguinal hernia open repair by Dr. Jamal Collin several years ago  Past Medical History:  Diagnosis Date  . Arthritis   . Complication of anesthesia    had a hard time recovering from anesthesia after knee replacement neuro symptoms  . Hyperlipidemia   . Hypertension     Past Surgical History:  Procedure Laterality Date  . eye implants  1992  . EYE SURGERY Bilateral    Cataract Extraction with IOL Implants  . HERNIA REPAIR Right 1992   Inguinal Hernia Repair  . INGUINAL HERNIA REPAIR Left 01/03/2016   Procedure: LAPAROSCOPIC INGUINAL HERNIA;  Surgeon: Jules Husbands, MD;  Location: [facility] ORS;  Service: General;  Laterality: Left;  . TOTAL KNEE ARTHROPLASTY Left 03/18/2016   Procedure: TOTAL KNEE ARTHROPLASTY;  Surgeon: Hessie Knows, MD;  Location: [facility] ORS;  Service: Orthopedics;  Laterality: Left;  . TRANSURETHRAL RESECTION OF BLADDER TUMOR N/A 09/19/2016   Procedure: TRANSURETHRAL RESECTION OF BLADDER TUMOR (TURBT) MEDIUM;  Surgeon: Nickie Retort, MD;  Location: [facility] ORS;  Service: Urology;  Laterality: N/A;    Family History  Problem Relation Age of Onset  . Stroke Father   . Diabetes Sister   . Breast cancer Sister   . Cancer Maternal  Aunt   . Hematuria Neg Hx   . Renal cancer Neg Hx   . Prostate cancer Neg Hx   . Bladder Cancer Neg Hx     Social History:  reports that he has never smoked. He has never used smokeless tobacco. He reports that he does not drink alcohol or use drugs.  Allergies: No Known Allergies  Medications reviewed.    ROS Full ROS performed and is otherwise negative other than what is stated in HPI   BP 131/82   Pulse (!) 51   Temp 97.7 F (36.5 C) (Skin)   Resp 16   Ht 5\' 9"  (1.753 m)   Wt 200 lb (90.7 kg)   SpO2 92%   BMI 29.53 kg/m   Physical Exam Vitals signs and nursing note reviewed.  Constitutional:      General: He is not in acute distress.    Appearance: Normal appearance. He is normal weight.  Eyes:     Extraocular Movements: Extraocular movements intact.     Pupils: Pupils are equal, round, and reactive to light.  Neck:     Musculoskeletal: Normal range of motion. No neck rigidity or muscular tenderness.  Cardiovascular:     Rate and Rhythm: Normal rate and regular rhythm.  Pulmonary:     Effort: Pulmonary effort is normal. No respiratory distress.     Breath sounds: No  stridor.  Abdominal:     General: Bowel sounds are normal. There is no distension.     Palpations: There is no mass.     Tenderness: There is no abdominal tenderness. There is no rebound.     Hernia: A hernia is present.     Comments: Visible left inguinal hernia mildly tender to palpation.  No peritonitis  Musculoskeletal: Normal range of motion.        General: No swelling.  Skin:    General: Skin is warm and dry.     Capillary Refill: Capillary refill takes less than 2 seconds.     Coloration: Skin is not jaundiced.  Neurological:     General: No focal deficit present.     Mental Status: He is alert and oriented to person, place, and time.  Psychiatric:        Mood and Affect: Mood normal.        Behavior: Behavior normal.        Thought Content: Thought content normal.         Judgment: Judgment normal.        Assessment/Plan: 85 year old male very functional with a recurrent left inguinal hernia.  Currently he is symptomatic.  Discussed with patient in detail about his disease process.  Given the fact that he is symptomatic I do recommend repair.  Given the fact that we did attempt last time I will definitely encourage a robotic TAP approach.  I also explained to him the alternative of an open inguinal hernia repair.  I explained to him that I feel very confident with robotic Repair is favorable outcomes.  He understands and he wishes to proceed.  We will plan for robotic left inguinal hernia.  Procedure discussed with the patient detail.  Risk benefit and possible complications including but not limited to: Bleeding, infection, recurrence, chronic pain.  He understands and wishes to proceed.  Note that I spent more than 40 minutes in this encounter with greater than 50% spent in coordination and counseling of his care   Greater than 50% of the 40 minutes  visit was spent in counseling/coordination of care   Caroleen Hamman, MD [place] Surgeon

## 2018-10-25 NOTE — Progress Notes (Signed)
Patient's surgery to be scheduled for 11-25-18 at [facility] with Dr. Dahlia Byes. This is per patient's request due to his schedule.   The patient is aware to have COVID-19 testing done on 11-22-18 at the [facility] drive thru ([address] [place]). He is aware to isolate after, have no visitors, wash hands frequently, and avoid touching face.   The patient is aware he will need to Pre-Admit. Patient will check in at the [place] entrance due to COVID-19 restrictions and will then be escorted to the [place], [address] (first floor). Patient will be contacted once Pre-admission appointment has been arranged with date and time.   Patient aware to be NPO after midnight and have a driver.   He is aware to check in at the [place] entrance where he will be screened for the coronavirus and then sent to [facility].   Patient aware that he may have no visitors and driver will need to wait in the car due to COVID-19 restrictions.   The patient verbalizes understanding of the above.   The patient is aware to call the office should he have further questions.

## 2018-10-26 ENCOUNTER — Telehealth: Payer: Self-pay | Admitting: Surgery

## 2018-10-26 NOTE — Telephone Encounter (Signed)
Patient's daughter called said her father would like to have his surgery r/s please call patient and r/s.

## 2018-10-27 ENCOUNTER — Telehealth: Payer: Self-pay | Admitting: *Deleted

## 2018-10-27 ENCOUNTER — Other Ambulatory Visit: Payer: Self-pay

## 2018-10-27 ENCOUNTER — Ambulatory Visit: Payer: Medicare Other | Admitting: Urology

## 2018-10-27 ENCOUNTER — Encounter: Payer: Self-pay | Admitting: Urology

## 2018-10-27 VITALS — BP 123/77 | HR 72 | Ht 69.0 in | Wt 196.8 lb

## 2018-10-27 DIAGNOSIS — Z8551 Personal history of malignant neoplasm of bladder: Secondary | ICD-10-CM | POA: Diagnosis not present

## 2018-10-27 MED ORDER — LIDOCAINE HCL URETHRAL/MUCOSAL 2 % EX GEL
1.0000 "application " | Freq: Once | CUTANEOUS | Status: AC
  Administered 2018-10-27: 1 via URETHRAL

## 2018-10-27 NOTE — Telephone Encounter (Signed)
Message left for patient to call the office.

## 2018-10-27 NOTE — Progress Notes (Signed)
   10/27/18  CC: No chief complaint on file.   HPI: Bladder cancer surveillance note  INDICATION History of bladder cancer. He underwent surveillance CT 07/2018 after an episode of gross hematuria. CT benign. No further gross hematuria.   UROLOGIC HISTORY Travis [name] a 85 y.o.males/p TURBT 09/2016 with Dr. Pilar Jarvis for large>6cmHG Ta UCC bladder tumors.  Initial Diagnosis of Bladder Year:09/2016 Pathology:HG Ta  Treatments for Bladder Cancer TURBT 09/2016 BCG induction 12/2016, maintenance 07/2017, maintenance 01/2018, missed maintenance 07/2018 - Covid-19  Upper tract imaging CT 07/21/2018 CT 09/01/2016  AUA Risk Category High risk   There were no vitals taken for this visit. NED. A&Ox3.   No respiratory distress   Abd soft, NT, ND Normal phallus with bilateral descended testicles  Cystoscopy Procedure Note  Patient identification was confirmed, informed consent was obtained, and patient was prepped using Betadine solution.  Lidocaine jelly was administered per urethral meatus.     Pre-Procedure: - Inspection reveals a normal caliber ureteral meatus.  Procedure: The flexible cystoscope was introduced without difficulty - No urethral strictures/lesions are present. - Enlarged   prostate , moderate hyperplasia, borderline obstruction - Normal bladder neck - Bilateral ureteral orifices identified - Bladder mucosa  reveals no ulcers, tumors, or lesions - No bladder stones - No trabeculation  Retroflexion shows normal bladder neck   Post-Procedure: - Patient tolerated the procedure well  Assessment/ Plan:  He is two years out with no recurrence. We discussed restarting BCG maintenance but he wants to continue to hold off to avoid exposure to office. This is reasonable. We went over CT results.   No follow-ups on file.  Festus Aloe, MD

## 2018-10-27 NOTE — Telephone Encounter (Signed)
Patient's daughter, Vanessa Barbara, called the office back.   She had called yesterday about changing patient's surgery date.   Today she states her dad is thinking about cancelling surgery all together due to coronavirus.   Daughter would like to leave surgery as scheduled for now. She thinks her dad may change his mind.   She is aware surgery is scheduled for 11-25-18 at [facility] with Dr. Dahlia Byes.   Patient will need to pre-admit on 11-22-18 at 10 am at the [facility] and have COVID testing done the same day.   I encouraged daughter to follow up with our office in 2 weeks to give dad enough time to think about having surgery. If he still does not want to proceed at that time, then we will cancel surgery per request.

## 2018-10-27 NOTE — Patient Instructions (Addendum)
Cystoscopy    Cystoscopy is a procedure that is used to help diagnose and sometimes treat conditions that affect that lower urinary tract. The lower urinary tract includes the bladder and the tube that drains urine from the bladder out of the body (urethra). Cystoscopy is performed with a thin, tube-shaped instrument with a light and camera at the end (cystoscope). The cystoscope may be hard (rigid) or flexible, depending on the goal of the procedure.The cystoscope is inserted through the urethra, into the bladder.  Cystoscopy may be recommended if you have:   Urinary tractinfections that keep coming back (recurring).   Blood in the urine (hematuria).   Loss of bladder control (urinary incontinence) or an overactive bladder.   Unusual cells found in a urine sample.   A blockage in the urethra.   Painful urination.   An abnormality in the bladder found during an intravenous pyelogram (IVP) or CT scan.  Cystoscopy may also be done to remove a sample of tissue to be examined under a microscope (biopsy).  Tell a health care provider about:   Any allergies you have.   All medicines you are taking, including vitamins, herbs, eye drops, creams, and over-the-counter medicines.   Any problems you or family members have had with anesthetic medicines.   Any blood disorders you have.   Any surgeries you have had.   Any medical conditions you have.   Whether you are pregnant or may be pregnant.  What are the risks?  Generally, this is a safe procedure. However, problems may occur, including:   Infection.   Bleeding.   Allergic reactions to medicines.   Damage to other structures or organs.  What happens before the procedure?   Ask your health care provider about:  ? Changing or stopping your regular medicines. This is especially important if you are taking diabetes medicines or blood thinners.  ? Taking medicines such as aspirin and ibuprofen. These medicines can thin your blood. Do not take these medicines  before your procedure if your health care provider instructs you not to.   Follow instructions from your health care provider about eating or drinking restrictions.   You may be given antibiotic medicine to help prevent infection.   You may have an exam or testing, such as X-rays of the bladder, urethra, or kidneys.   You may have urine tests to check for signs of infection.   Plan to have someone take you home after the procedure.  What happens during the procedure?   To reduce your risk of infection,your health care team will wash or sanitize their hands.   You will be given one or more of the following:  ? A medicine to help you relax (sedative).  ? A medicine to numb the area (local anesthetic).   The area around the opening of your urethra will be cleaned.   The cystoscope will be passed through your urethra into your bladder.   Germ-free (sterile)fluid will flow through the cystoscope to fill your bladder. The fluid will stretch your bladder so that your surgeon can clearly examine your bladder walls.   The cystoscope will be removed and your bladder will be emptied.  The procedure may vary among health care providers and hospitals.  What happens after the procedure?   You may have some soreness or pain in your abdomen and urethra. Medicines will be available to help you.   You may have some blood in your urine.   Do not drive for   24 hours if you received a sedative.  This information is not intended to replace advice given to you by your health care provider. Make sure you discuss any questions you have with your health care provider.  Document Released: 04/25/2000 Document Revised: 02/06/2017 Document Reviewed: 03/15/2015  Elsevier Interactive Patient Education  2019 [employer]

## 2018-10-28 LAB — URINALYSIS, COMPLETE
Bilirubin, UA: NEGATIVE
Glucose, UA: NEGATIVE
Ketones, UA: NEGATIVE
Leukocytes,UA: NEGATIVE
Nitrite, UA: NEGATIVE
Protein,UA: NEGATIVE
Specific Gravity, UA: >1.03 — ABNORMAL HIGH (ref 1.005–1.030)
Urobilinogen, Ur: 0.2 mg/dL (ref 0.2–1.0)
pH, UA: 5 (ref 5.0–7.5)

## 2018-10-28 LAB — MICROSCOPIC EXAMINATION
Bacteria, UA: NONE SEEN
Epithelial Cells (non renal): NONE SEEN /hpf (ref 0–10)
RBC, Urine: NONE SEEN /hpf (ref 0–2)

## 2018-11-03 ENCOUNTER — Other Ambulatory Visit: Payer: Medicare Other | Admitting: Urology

## 2018-11-11 ENCOUNTER — Telehealth: Payer: Self-pay | Admitting: *Deleted

## 2018-11-11 NOTE — Telephone Encounter (Signed)
Patient's daughter, Vanessa Barbara, called the office today and spoke with Raquel Sarna. She stated that patient wants to cancel robotic left inguinal hernia repair at this time due to COVID-19. He does not wish to reschedule.  Note routed to Dr. Dahlia Byes.

## 2018-11-22 ENCOUNTER — Other Ambulatory Visit: Payer: Medicare Other

## 2018-11-25 ENCOUNTER — Ambulatory Visit: Admit: 2018-11-25 | Payer: Medicare Other | Admitting: Surgery

## 2018-11-25 SURGERY — ROBOT ASSISTED INGUINAL HERNIA REPAIR
Anesthesia: General | Laterality: Left

## 2019-02-02 ENCOUNTER — Other Ambulatory Visit: Payer: Self-pay

## 2019-02-02 MED ORDER — OXYBUTYNIN CHLORIDE ER 10 MG PO TB24: 10.0000 mg | ORAL_TABLET | Freq: Every day | ORAL | 11 refills | Status: DC

## 2019-04-27 ENCOUNTER — Other Ambulatory Visit: Payer: Medicare Other | Admitting: Urology

## 2019-05-24 DIAGNOSIS — R251 Tremor, unspecified: Secondary | ICD-10-CM | POA: Insufficient documentation

## 2019-05-25 ENCOUNTER — Ambulatory Visit: Payer: Self-pay | Admitting: General Surgery

## 2019-05-25 NOTE — H&P (View-Only) (Signed)
PATIENT PROFILE: Travis [name] is a 86 y.o. male who presents to the Clinic for consultation at the request of Dr. Ouida [name] for evaluation of left recurrent inguinal hernia.  PCP:  Travis Donath, MD  HISTORY OF PRESENT ILLNESS: Travis [name] reports having a left inguinal hernia since a year ago.  He reported that it was repaired in 2017.  He started feeling it again last year.  He reports some discomfort and pain when it comes out.  The pain is relieved with reduction of the hernia.  He is able to reduce the hernia by itself.  He denies pain radiation to other part of the body.  I reviewed the chart and I was able to see the operation report from laparoscopic left inguinal hernia repair in 2017.  There was placement of mesh.  The patient was also evaluated by the original surgeon who recommended to have this repaired robotically.  Patient was second opinion.   PROBLEM LIST:         Problem List  Date Reviewed: 05/24/2019         Noted   Memory loss of unknown cause 05/24/2019   Tremor 05/24/2019   Shaking 05/24/2019   Sensation of skin crawling 05/24/2019   Essential tremor 05/10/2019   Bladder cancer (CMS-HCC) 10/20/2016   Overview    Bcg after tumor resection      Essential tremor 04/29/2016   Overview    Propranolol caused bradycardia      B12 deficiency 10/23/2015   MCI (mild cognitive impairment) 10/16/2015   Left knee pain 10/16/2015   Difficulty walking 10/16/2015   Health care maintenance 01/23/2015   Overview    Colonoscopy in 2007. Pneumovax 2008 and prevnar 13 rx 12-17. Tdap 5-12. No zostavax re cost. [name] [facility] yearly. Outdoor work continued. Full code. No long term support.    MEDICARE WELLNESS VISIT  PROVIDERS RENDERING CARE Dr Travis [name], Dr Travis [name], Travis [name] eye  FUNCTIONAL ASSESSMENT  (1) Hearing: Demonstrates normal hearing in conversation.  (2) Risk of Falls: No reports of falls or abnormal balance. Gait is observed to  be good upon observation.  (3) Home Safety; Home is safe and secure (4) Activities of Daily Living; Household chores and grooming are managed without problems. Personal finances are managed without problems.   DEPRESSION SCREENING There does not seem to be loss of interest in activities nor excess crying or changes in sleep or appetite.   COGNITIVE SCREENING Orientation is appropriate as are responses to questions and general conversation. No reports of forgetfulness or losing things.    PREVENTION PLAN Aggressive screening is not warranted at current age Glaucoma; Travis [name] eye yearly Pneumonia: Pneumovax 2008, prevnar 13 prescription 12-17 Shingles; Declined zostavax re cost and shingrix too Influenza;Yearly each fall Smoking Cessation; NA   OTHER PERSONALIZED HEALTH ADVISE Does a lot of outside work. [name] sustaining diet  END OF LIFE CARE WANTS Full code, no long term support.  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   OA (osteoarthritis) 01/01/2014      GENERAL REVIEW OF SYSTEMS:   General ROS: negative for - chills, fatigue, fever, weight gain or weight loss Allergy and Immunology ROS: negative for - hives  Hematological and Lymphatic ROS: negative for - bleeding problems or bruising, negative for palpable  nodes Endocrine ROS: negative for - heat or cold intolerance, hair changes Respiratory ROS: negative for - cough, shortness of breath or wheezing Cardiovascular ROS: no chest pain or palpitations GI ROS: negative for nausea, vomiting, abdominal pain, diarrhea, constipation Musculoskeletal ROS: negative for - joint swelling or muscle pain Neurological ROS: negative for - confusion, syncope Dermatological ROS: negative for pruritus and rash Psychiatric: negative for anxiety,  depression, difficulty sleeping. positive for memory loss  MEDICATIONS: Current Medications        Current Outpatient Medications  Medication Sig Dispense Refill  . cyanocobalamin (VITAMIN B12) 1000 MCG tablet Take 1,000 mcg by mouth once daily.    Marland Kitchen oxybutynin (DITROPAN-XL) 10 MG XL tablet Take 10 mg by mouth once daily     No current facility-administered medications for this visit.       ALLERGIES: Patient has no known allergies.  PAST MEDICAL HISTORY:     Past Medical History:  Diagnosis Date  . Atypical chest pain   . GERD (gastroesophageal reflux disease)   . History of goiter   . Hyperlipidemia   . Hypertension   . Seasonal allergies     PAST SURGICAL HISTORY:      Past Surgical History:  Procedure Laterality Date  . ARTHROPLASTY TOTAL KNEE Left 03/18/2016  . HERNIA REPAIR    . Surgery on eye       FAMILY HISTORY:      Family History  Problem Relation Age of Onset  . Myocardial Infarction (Heart attack) Mother   . Stroke Father   . Lung cancer Brother   . Diabetes type II Other   . Glaucoma Other      SOCIAL HISTORY: Social History          Socioeconomic History  . Marital status: Widowed    Spouse name: Not on file  . Number of children: Not on file  . Years of education: Not on file  . Highest education level: Not on file  Occupational History  . Not on file  Social Needs  . Financial resource strain: Not on file  . Food insecurity    Worry: Not on file    Inability: Not on file  . Transportation needs    Medical: Not on file    Non-medical: Not on file  Tobacco Use  . Smoking status: Never Smoker  . Smokeless tobacco: Never Used  Substance and Sexual Activity  . Alcohol use: No    Alcohol/week: 0.0 standard drinks  . Drug use: No  . Sexual activity: Defer  Other Topics Concern  . Not on file  Social History Narrative  . Not on file      PHYSICAL EXAM:    Vitals:   05/25/19 1102   BP: 115/72  Pulse: (!) 47   Body mass index is 27.91 kg/m. Weight: 85.7 kg (189 lb)   GENERAL: Alert, active, oriented x3  HEENT: Pupils equal reactive to light. Extraocular movements are intact. Sclera clear. Palpebral conjunctiva normal red color.Pharynx clear.  NECK: Supple with no palpable mass and no adenopathy.  LUNGS: Sound clear with no rales rhonchi or wheezes.  HEART: Regular rhythm S1 and S2 without murmur.  ABDOMEN: Soft and depressible, nontender with no palpable mass, no hepatomegaly.  Left inguinal hernia reducible, mild tender to reduction.  EXTREMITIES: Well-developed well-nourished symmetrical with no dependent edema.  NEUROLOGICAL: Awake alert oriented, facial expression symmetrical, moving all extremities.  REVIEW OF DATA: I have reviewed the following data today:  Initial consult on 05/24/2019  Component Date Value  . Vitamin B12 05/24/2019 482   . Vitamin D, 25-Hydroxy - * 05/24/2019 16.0*  . Ferritin 05/24/2019 64   . Folate (Folic Acid) 93/73/4287 14.3   . Hemoglobin A1C 05/24/2019 6.0*  . Average Blood Glucose (C* 05/24/2019 126   . Thyroid Stimulating Horm* 05/24/2019 1.195   Office Visit on 05/10/2019  Component Date Value  . Vitamin B12 05/10/2019 141*  . Glucose 05/10/2019 78   . Sodium 05/10/2019 141   . Potassium 05/10/2019 4.4   . Chloride 05/10/2019 103   . Carbon Dioxide (CO2) 05/10/2019 30.2   . Urea Nitrogen (BUN) 05/10/2019 14   . Creatinine 05/10/2019 1.0   . Glomerular Filtration Ra* 05/10/2019 86   . Calcium 05/10/2019 9.9   . AST  05/10/2019 13   . ALT  05/10/2019 9   . Alk Phos (alkaline Phosp* 05/10/2019 88   . Albumin 05/10/2019 4.4   . Bilirubin, Total 05/10/2019 0.7   . Protein, Total 05/10/2019 6.1   . A/G Ratio 05/10/2019 2.6   . Cholesterol, Total 05/10/2019 160   . Triglyceride 05/10/2019 50   . HDL (High Density Lipopr* 05/10/2019 47.9   . LDL Calculated 05/10/2019 102   . VLDL Cholesterol  05/10/2019 10   . Cholesterol/HDL Ratio 05/10/2019 3.3   . WBC (White Blood Cell Co* 05/10/2019 4.8   . RBC (Red Blood Cell Coun* 05/10/2019 5.58   . Hemoglobin 05/10/2019 13.8*  . Hematocrit 05/10/2019 44.5   . MCV (Mean Corpuscular Vo* 05/10/2019 79.7*  . MCH (Mean Corpuscular He* 05/10/2019 24.7*  . MCHC (Mean Corpuscular H* 05/10/2019 31.0*  . Platelet Count 05/10/2019 206   . RDW-CV (Red Cell Distrib* 05/10/2019 15.3*  . MPV (Mean Platelet Volum* 05/10/2019 11.0   . Neutrophils 05/10/2019 2.32   . Lymphocytes 05/10/2019 1.94   . Monocytes 05/10/2019 0.45   . Eosinophils 05/10/2019 0.10   . Basophils 05/10/2019 0.02   . Neutrophil % 05/10/2019 47.9   . Lymphocyte % 05/10/2019 40.1   . Monocyte % 05/10/2019 9.3   . Eosinophil % 05/10/2019 2.1   . Basophil% 05/10/2019 0.4   . Immature Granulocyte % 05/10/2019 0.2   . Immature Granulocyte Cou* 05/10/2019 0.01      ASSESSMENT: Travis [name] is a 86 y.o. male presenting for consultation for recurrent left inguinal hernia.    The patient presents with a symptomatic, recurrent reducible inguinal hernia. Patient was oriented about the diagnosis of inguinal hernia and its implication. The patient was oriented about the treatment alternatives (observation vs surgical repair). Due to patient symptoms, repair is recommended. Patient oriented about the surgical procedure, the use of mesh and its risk of complications such as: infection, bleeding, injury to vas deference, vasculature and testicle, injury to bowel or bladder, and chronic pain.  Unilateral recurrent inguinal hernia without obstruction or gangrene [K40.91]  PLAN: 1. Left open inguinal recurrent inguinal hernia repair with mesh (68115) 2. CBC, CMP done 05/10/2019 3. Internal medicine clearance (Dr. Ruthann Cancer) 4. Do not take aspirin 5 days before surgery 5. Contact us if has any question or concern.   Patient and her daughter verbalized understanding, all questions were  answered, and were agreeable with the plan outlined above.   I spent a total of 60 minutes in both face-to-face and non-face-to-face activities for this visit on the date of this encounter.   Herbert Pun, MD  Electronically signed by Herbert Pun, MD

## 2019-05-25 NOTE — H&P (Signed)
PATIENT PROFILE: Travis [name] is a 86 y.o. male who presents to the Clinic for consultation at the request of Dr. Ouida Sills for evaluation of left recurrent inguinal hernia.  PCP:  Harrold Donath, MD  HISTORY OF PRESENT ILLNESS: Mr. Lightsey reports having a left inguinal hernia since a year ago.  He reported that it was repaired in 2017.  He started feeling it again last year.  He reports some discomfort and pain when it comes out.  The pain is relieved with reduction of the hernia.  He is able to reduce the hernia by itself.  He denies pain radiation to other part of the body.  I reviewed the chart and I was able to see the operation report from laparoscopic left inguinal hernia repair in 2017.  There was placement of mesh.  The patient was also evaluated by the original surgeon who recommended to have this repaired robotically.  Patient was second opinion.   PROBLEM LIST:         Problem List  Date Reviewed: 05/24/2019         Noted   Memory loss of unknown cause 05/24/2019   Tremor 05/24/2019   Shaking 05/24/2019   Sensation of skin crawling 05/24/2019   Essential tremor 05/10/2019   Bladder cancer (CMS-HCC) 10/20/2016   Overview    Bcg after tumor resection      Essential tremor 04/29/2016   Overview    Propranolol caused bradycardia      B12 deficiency 10/23/2015   MCI (mild cognitive impairment) 10/16/2015   Left knee pain 10/16/2015   Difficulty walking 10/16/2015   Health care maintenance 01/23/2015   Overview    Colonoscopy in 2007. Pneumovax 2008 and prevnar 13 rx 12-17. Tdap 5-12. No zostavax re cost. [place] [facility] yearly. Outdoor work continued. Full code. No long term support.    MEDICARE WELLNESS VISIT  PROVIDERS RENDERING CARE Dr Ouida Sills, Dr Rudene Christians, [facility]  FUNCTIONAL ASSESSMENT  (1) Hearing: Demonstrates normal hearing in conversation.  (2) Risk of Falls: No reports of falls or abnormal balance. Gait is observed to  be good upon observation.  (3) Home Safety; Home is safe and secure (4) Activities of Daily Living; Household chores and grooming are managed without problems. Personal finances are managed without problems.   DEPRESSION SCREENING There does not seem to be loss of interest in activities nor excess crying or changes in sleep or appetite.   COGNITIVE SCREENING Orientation is appropriate as are responses to questions and general conversation. No reports of forgetfulness or losing things.    PREVENTION PLAN Aggressive screening is not warranted at current age Glaucoma; [place] [facility] yearly Pneumonia: Pneumovax 2008, prevnar 13 prescription 12-17 Shingles; Declined zostavax re cost and shingrix too Influenza;Yearly each fall Smoking Cessation; NA   OTHER PERSONALIZED HEALTH ADVISE Does a lot of outside work. [name] sustaining diet  END OF LIFE CARE WANTS Full code, no long term support.  Frazier Richards MD        HTN (hypertension), benign 01/01/2014   Overview    Off meds and normal of late      Hypercholesterolemia 01/01/2014   BPH with obstruction/lower urinary tract symptoms 01/01/2014   OA (osteoarthritis) 01/01/2014      GENERAL REVIEW OF SYSTEMS:   General ROS: negative for - chills, fatigue, fever, weight gain or weight loss Allergy and Immunology ROS: negative for - hives  Hematological and Lymphatic ROS: negative for - bleeding problems or bruising, negative for palpable  nodes Endocrine ROS: negative for - heat or cold intolerance, hair changes Respiratory ROS: negative for - cough, shortness of breath or wheezing Cardiovascular ROS: no chest pain or palpitations GI ROS: negative for nausea, vomiting, abdominal pain, diarrhea, constipation Musculoskeletal ROS: negative for - joint swelling or muscle pain Neurological ROS: negative for - confusion, syncope Dermatological ROS: negative for pruritus and rash Psychiatric: negative for anxiety,  depression, difficulty sleeping. positive for memory loss  MEDICATIONS: Current Medications        Current Outpatient Medications  Medication Sig Dispense Refill  . cyanocobalamin (VITAMIN B12) 1000 MCG tablet Take 1,000 mcg by mouth once daily.    Marland Kitchen oxybutynin (DITROPAN-XL) 10 MG XL tablet Take 10 mg by mouth once daily     No current facility-administered medications for this visit.       ALLERGIES: Patient has no known allergies.  PAST MEDICAL HISTORY:     Past Medical History:  Diagnosis Date  . Atypical chest pain   . GERD (gastroesophageal reflux disease)   . History of goiter   . Hyperlipidemia   . Hypertension   . Seasonal allergies     PAST SURGICAL HISTORY:      Past Surgical History:  Procedure Laterality Date  . ARTHROPLASTY TOTAL KNEE Left 03/18/2016  . HERNIA REPAIR    . Surgery on eye       FAMILY HISTORY:      Family History  Problem Relation Age of Onset  . Myocardial Infarction (Heart attack) Mother   . Stroke Father   . Lung cancer Brother   . Diabetes type II Other   . Glaucoma Other      SOCIAL HISTORY: Social History          Socioeconomic History  . Marital status: Widowed    Spouse name: Not on file  . Number of children: Not on file  . Years of education: Not on file  . Highest education level: Not on file  Occupational History  . Not on file  Social Needs  . Financial resource strain: Not on file  . Food insecurity    Worry: Not on file    Inability: Not on file  . Transportation needs    Medical: Not on file    Non-medical: Not on file  Tobacco Use  . Smoking status: Never Smoker  . Smokeless tobacco: Never Used  Substance and Sexual Activity  . Alcohol use: No    Alcohol/week: 0.0 standard drinks  . Drug use: No  . Sexual activity: Defer  Other Topics Concern  . Not on file  Social History Narrative  . Not on file      PHYSICAL EXAM:    Vitals:   05/25/19 1102   BP: 115/72  Pulse: (!) 47   Body mass index is 27.91 kg/m. Weight: 85.7 kg (189 lb)   GENERAL: Alert, active, oriented x3  HEENT: Pupils equal reactive to light. Extraocular movements are intact. Sclera clear. Palpebral conjunctiva normal red color.Pharynx clear.  NECK: Supple with no palpable mass and no adenopathy.  LUNGS: Sound clear with no rales rhonchi or wheezes.  HEART: Regular rhythm S1 and S2 without murmur.  ABDOMEN: Soft and depressible, nontender with no palpable mass, no hepatomegaly.  Left inguinal hernia reducible, mild tender to reduction.  EXTREMITIES: Well-developed well-nourished symmetrical with no dependent edema.  NEUROLOGICAL: Awake alert oriented, facial expression symmetrical, moving all extremities.  REVIEW OF DATA: I have reviewed the following data today:  Initial consult on 05/24/2019  Component Date Value  . Vitamin B12 05/24/2019 482   . Vitamin D, 25-Hydroxy - * 05/24/2019 16.0*  . Ferritin 05/24/2019 64   . Folate (Folic Acid) 00/93/8182 14.3   . Hemoglobin A1C 05/24/2019 6.0*  . Average Blood Glucose (C* 05/24/2019 126   . Thyroid Stimulating Horm* 05/24/2019 1.195   Office Visit on 05/10/2019  Component Date Value  . Vitamin B12 05/10/2019 141*  . Glucose 05/10/2019 78   . Sodium 05/10/2019 141   . Potassium 05/10/2019 4.4   . Chloride 05/10/2019 103   . Carbon Dioxide (CO2) 05/10/2019 30.2   . Urea Nitrogen (BUN) 05/10/2019 14   . Creatinine 05/10/2019 1.0   . Glomerular Filtration Ra* 05/10/2019 86   . Calcium 05/10/2019 9.9   . AST  05/10/2019 13   . ALT  05/10/2019 9   . Alk Phos (alkaline Phosp* 05/10/2019 88   . Albumin 05/10/2019 4.4   . Bilirubin, Total 05/10/2019 0.7   . Protein, Total 05/10/2019 6.1   . A/G Ratio 05/10/2019 2.6   . Cholesterol, Total 05/10/2019 160   . Triglyceride 05/10/2019 50   . HDL (High Density Lipopr* 05/10/2019 47.9   . LDL Calculated 05/10/2019 102   . VLDL Cholesterol  05/10/2019 10   . Cholesterol/HDL Ratio 05/10/2019 3.3   . WBC (White Blood Cell Co* 05/10/2019 4.8   . RBC (Red Blood Cell Coun* 05/10/2019 5.58   . Hemoglobin 05/10/2019 13.8*  . Hematocrit 05/10/2019 44.5   . MCV (Mean Corpuscular Vo* 05/10/2019 79.7*  . MCH (Mean Corpuscular He* 05/10/2019 24.7*  . MCHC (Mean Corpuscular H* 05/10/2019 31.0*  . Platelet Count 05/10/2019 206   . RDW-CV (Red Cell Distrib* 05/10/2019 15.3*  . MPV (Mean Platelet Volum* 05/10/2019 11.0   . Neutrophils 05/10/2019 2.32   . Lymphocytes 05/10/2019 1.94   . Monocytes 05/10/2019 0.45   . Eosinophils 05/10/2019 0.10   . Basophils 05/10/2019 0.02   . Neutrophil % 05/10/2019 47.9   . Lymphocyte % 05/10/2019 40.1   . Monocyte % 05/10/2019 9.3   . Eosinophil % 05/10/2019 2.1   . Basophil% 05/10/2019 0.4   . Immature Granulocyte % 05/10/2019 0.2   . Immature Granulocyte Cou* 05/10/2019 0.01      ASSESSMENT: Mr. Bushong is a 86 y.o. male presenting for consultation for recurrent left inguinal hernia.    The patient presents with a symptomatic, recurrent reducible inguinal hernia. Patient was oriented about the diagnosis of inguinal hernia and its implication. The patient was oriented about the treatment alternatives (observation vs surgical repair). Due to patient symptoms, repair is recommended. Patient oriented about the surgical procedure, the use of mesh and its risk of complications such as: infection, bleeding, injury to vas deference, vasculature and testicle, injury to bowel or bladder, and chronic pain.  Unilateral recurrent inguinal hernia without obstruction or gangrene [K40.91]  PLAN: 1. Left open inguinal recurrent inguinal hernia repair with mesh (99371) 2. CBC, CMP done 05/10/2019 3. Internal medicine clearance (Dr. Ruthann Cancer) 4. Do not take aspirin 5 days before surgery 5. Contact us if has any question or concern.   Patient and her daughter verbalized understanding, all questions were  answered, and were agreeable with the plan outlined above.   I spent a total of 60 minutes in both face-to-face and non-face-to-face activities for this visit on the date of this encounter.   Herbert Pun, MD  Electronically signed by Herbert Pun, MD

## 2019-05-26 ENCOUNTER — Other Ambulatory Visit: Payer: Self-pay | Admitting: Acute Care

## 2019-05-26 ENCOUNTER — Other Ambulatory Visit (HOSPITAL_COMMUNITY): Payer: Self-pay | Admitting: Acute Care

## 2019-05-26 DIAGNOSIS — R413 Other amnesia: Secondary | ICD-10-CM

## 2019-05-27 ENCOUNTER — Telehealth (HOSPITAL_COMMUNITY): Payer: Self-pay

## 2019-05-30 ENCOUNTER — Encounter
Admission: RE | Admit: 2019-05-30 | Discharge: 2019-05-30 | Disposition: A | Discharge status: Home / Self Care | Payer: Medicare Other | Source: Ambulatory Visit | Attending: General Surgery | Admitting: General Surgery

## 2019-05-30 ENCOUNTER — Other Ambulatory Visit: Payer: Self-pay

## 2019-05-30 ENCOUNTER — Other Ambulatory Visit
Admission: RE | Admit: 2019-05-30 | Discharge: 2019-05-30 | Disposition: A | Discharge status: Home / Self Care | Payer: Medicare Other | Source: Ambulatory Visit | Attending: General Surgery | Admitting: General Surgery

## 2019-05-30 DIAGNOSIS — Z01818 Encounter for other preprocedural examination: Secondary | ICD-10-CM | POA: Diagnosis not present

## 2019-05-30 HISTORY — DX: Malignant (primary) neoplasm, unspecified: C80.1

## 2019-05-30 LAB — SARS CORONAVIRUS 2 (TAT 6-24 HRS): SARS Coronavirus 2: NEGATIVE

## 2019-05-30 NOTE — Pre-Procedure Instructions (Signed)
Progress Notes - documented in this encounter Travis Donath, MD - 05/10/2019 11:30 AM EST Formatting of this note might be different from the original. MEDICARE WELLNESS VISIT  PROVIDERS RENDERING CARE Dr Ouida Sills, Dr Rudene Christians, [place] [facility]  FUNCTIONAL ASSESSMENT  (1) Hearing: Demonstrates normal hearing in conversation.  (2) Risk of Falls: No reports of falls or abnormal balance. Gait is observed to be good upon observation.  (3) Home Safety; Home is safe and secure (4) Activities of Daily Living; Household chores and grooming are managed without problems. Personal finances are managed without problems.   DEPRESSION SCREENING There does not seem to be loss of interest in activities nor excess crying or changes in sleep or appetite.   COGNITIVE SCREENING Orientation is appropriate as are responses to questions and general conversation. No reports of forgetfulness or losing things.    PREVENTION PLAN Aggressive screening is not warranted at current age Glaucoma; [place] [facility] yearly Pneumonia: Pneumovax 2008, prevnar 13 prescription 12-17 Shingles; Declined zostavax re cost and shingrix too Influenza;Yearly each fall Smoking Cessation; NA   OTHER PERSONALIZED HEALTH ADVISE Does a lot of outside work. [name] sustaining diet  END OF LIFE CARE WANTS Full code, no long term support.  Frazier Richards MD   [facility] 2/9 last 3 flowsheet values  PHQ-2/9 Depression Screening 05/10/2019  Over the last 2 weeks, have you experienced little pleasure or interest in doing things? 3  Feeling down, depressed, or hopeless (or irritable for Teens only)? 3  Total Prescreening Score 6  Trouble falling or staying asleep, or sleeping too much? 2  Feeling tired or having little energy? 3  Poor appetite or overeating? 0  Feeling bad about yourself - or that you are a failure or have let yourself or your family down? 1  Trouble concentrating on things, such as reading the  newspaper or watching television? 3  Moving or speaking so slowly that other people could have noticed? Or the opposite - being so fidgety or restless that you have been moving around a lot more than usual? 3  Thoughts that you would be better off dead, or of hurting yourself in some way? 0  How difficult have these problems made it for you do your work, take care of things at home, or get along with people? Somewhat difficult  Total Score = 18   Depression Severity and Treatment Recommendations:  0-4= None  5-9= Mild / Treatment: Support, educate to call if worse; return in one month  10-14= Moderate / Treatment: Support, watchful waiting; Antidepressant or Psychotherapy  15-19= Moderately severe / Treatment: Antidepressant OR Psychotherapy  >= 20 = Major depression, severe / Antidepressant AND Psychotherapy  Please note approximately 15 minutes was spent and depression screening by me and nursing staff.     Travis [name] is a 86 y.o. male here for his annual exam with health maintenance  Chief Complaint  Annual exam  HISTORY OF PRESENT ILLNESS  HTN (hypertension), benign Bp has been better of late  Hypercholesterolemia Healthy fat diet is being followed and no myalgia's are noted.   MCI (mild cognitive impairment) Decreased energy, mood flat. Slowly worse and some shaking of hands  Review of Systems; Constitutional; No weight loss, fever, chills, weakness  HEENT: No visual loss, blurred vision, hearing loss, ear pain, runny nose or sore throat SKIN; No rash or itching CARDIOVASCULAR; No chest pain, pressure. No palpitations or edema RESPIRATORY; No shortness of breath, cough or sputum GASTROINTESTINAL; No nausea, vomiting, diarrhea  or dysphagia GENITOURINARY; No dysuria, new incontinence, suprapubic pain NEUROLOGICAL; No headache, dizziness, syncopy, tingling MUSCULOSKELETAL; No muscle or joint pain or injuries HEMATOLOGICAL; No anemia, bleeding or abnormal bruising  noted PSYCHIATRIC; No manic symptoms or severely blue mood ENDOCRINE; No sweating, temperature intolerance or polyuria, polydipsia  Patient Active Problem List  Diagnosis  . HTN (hypertension), benign  . Hypercholesterolemia  . BPH with obstruction/lower urinary tract symptoms  . OA (osteoarthritis)  . Health care maintenance  . MCI (mild cognitive impairment)  . Left knee pain  . Difficulty walking  . B12 deficiency  . Essential tremor  . Bladder cancer (CMS-HCC)  . Essential tremor   Past Medical History:  Diagnosis Date  . Atypical chest pain  . GERD (gastroesophageal reflux disease)  . History of goiter  . Hyperlipidemia  . Hypertension  . Seasonal allergies   Past Surgical History:  Procedure Laterality Date  . ARTHROPLASTY TOTAL KNEE Left 03/18/2016  . HERNIA REPAIR  . Surgery on eye   Family History  Problem Relation Age of Onset  . Myocardial Infarction (Heart attack) Mother  . Stroke Father  . Lung cancer Brother  . Diabetes type II Other  . Glaucoma Other   No Known Allergies   Social History   Socioeconomic History  . Marital status: Widowed  Spouse name: Not on file  . Number of children: Not on file  . Years of education: Not on file  . Highest education level: Not on file  Occupational History  . Not on file  Social Needs  . Financial resource strain: Not on file  . Food insecurity  Worry: Not on file  Inability: Not on file  . Transportation needs  Medical: Not on file  Non-medical: Not on file  Tobacco Use  . Smoking status: Never Smoker  . Smokeless tobacco: Never Used  Substance and Sexual Activity  . Alcohol use: No  Alcohol/week: 0.0 standard drinks  . Drug use: No  . Sexual activity: Defer  Lifestyle  . Physical activity  Days per week: Not on file  Minutes per session: Not on file  . Stress: Not on file  Relationships  . Social Medical illustrator on phone: Not on file  Gets together: Not on file  Attends religious  service: Not on file  Active member of club or organization: Not on file  Attends meetings of clubs or organizations: Not on file  Relationship status: Not on file  Other Topics Concern  . Not on file  Social History Narrative  . Not on file     Current Outpatient Medications:  . buPROPion (WELLBUTRIN XL) 150 MG XL tablet, Take 1 tablet (150 mg total) by mouth once daily, Disp: 30 tablet, Rfl: 11 . oxybutynin (DITROPAN-XL) 10 MG XL tablet, Take 10 mg by mouth once daily, Disp: , Rfl:  . cyanocobalamin (VITAMIN B12) 1000 MCG tablet, Take 1,000 mcg by mouth once daily., Disp: , Rfl:  . nabumetone (RELAFEN) 500 MG tablet, TAKE 1 TABLET(500 MG) BY MOUTH TWICE DAILY (Patient not taking: Reported on 05/10/2019), Disp: 60 tablet, Rfl: 11  Vitals:  05/10/19 1126  BP: 123/76  Pulse: 55  Body mass index is 28.77 kg/m. No acute distress, pleasant  HEENT: Normocephalic and Atraumatic, Oropharynx is clear, Tympanic membranes clear, conjunctiva have normal color NECK: No bruits, thyromegalia or adenopathy is noted CHEST; No distress, normal to inspection, clear to auscultation CARDIOVASCULAR; Regular rate and rhythm, no murmurs rubs or gallops appreciated. Peripheral pulses were palpated  and present.  ABDOMEN; Soft and flat and nontender with bowel sounds appreciated in the normal range EXTREMITIES; No clubbing cyanosis or edema NEUROLOGICAL; Alert and responsive with good insight. Motor function and sensation are intact. Reflexes are present SKIN; No suspicious lesions are noted.   Office Visit on 11/02/2018  Component Date Value Ref Range Status  . Glucose 11/02/2018 124* 70 - 110 mg/dL Final  . Sodium 11/02/2018 138 136 - 145 mmol/L Final  . Potassium 11/02/2018 3.7 3.6 - 5.1 mmol/L Final  . Chloride 11/02/2018 101 97 - 109 mmol/L Final  . Carbon Dioxide (CO2) 11/02/2018 31.6 22.0 - 32.0 mmol/L Final  . Urea Nitrogen (BUN) 11/02/2018 21 7 - 25 mg/dL Final  . Creatinine 11/02/2018 1.2  0.7 - 1.3 mg/dL Final  . Glomerular Filtration Rate (eGFR),* 11/02/2018 70 >60 mL/min/1.73sq m Final  . Calcium 11/02/2018 9.3 8.7 - 10.3 mg/dL Final  . AST 11/02/2018 11 8 - 39 U/L Final  . ALT 11/02/2018 10 6 - 57 U/L Final  . Alk Phos (alkaline Phosphatase) 11/02/2018 93 34 - 104 U/L Final  . Albumin 11/02/2018 3.9 3.5 - 4.8 g/dL Final  . Bilirubin, Total 11/02/2018 1.0 0.3 - 1.2 mg/dL Final  . Protein, Total 11/02/2018 6.4 6.1 - 7.9 g/dL Final  . A/G Ratio 11/02/2018 1.6 1.0 - 5.0 gm/dL Final  . WBC (White Blood Cell Count) 11/02/2018 13.0* 4.1 - 10.2 10^3/uL Final  . RBC (Red Blood Cell Count) 11/02/2018 5.26 4.69 - 6.13 10^6/uL Final  . Hemoglobin 11/02/2018 13.1* 14.1 - 18.1 gm/dL Final  . Hematocrit 11/02/2018 42.2 40.0 - 52.0 % Final  . MCV (Mean Corpuscular Volume) 11/02/2018 80.2 80.0 - 100.0 fl Final  . MCH (Mean Corpuscular Hemoglobin) 11/02/2018 24.9* 27.0 - 31.2 pg Final  . MCHC (Mean Corpuscular Hemoglobin * 11/02/2018 31.0* 32.0 - 36.0 gm/dL Final  . Platelet Count 11/02/2018 159 150 - 450 10^3/uL Final  . RDW-CV (Red Cell Distribution Widt* 11/02/2018 15.2* 11.6 - 14.8 % Final  . MPV (Mean Platelet Volume) 11/02/2018 10.8 9.4 - 12.4 fl Final  . Neutrophils 11/02/2018 10.40* 1.50 - 7.80 10^3/uL Final  . Lymphocytes 11/02/2018 1.21 1.00 - 3.60 10^3/uL Final  . Monocytes 11/02/2018 1.29 0.00 - 1.50 10^3/uL Final  . Eosinophils 11/02/2018 0.02 0.00 - 0.55 10^3/uL Final  . Basophils 11/02/2018 0.02 0.00 - 0.09 10^3/uL Final  . Neutrophil % 11/02/2018 80.2* 32.0 - 70.0 % Final  . Lymphocyte % 11/02/2018 9.3* 10.0 - 50.0 % Final  . Monocyte % 11/02/2018 9.9 4.0 - 13.0 % Final  . Eosinophil % 11/02/2018 0.2* 1.0 - 5.0 % Final  . Basophil% 11/02/2018 0.2 0.0 - 2.0 % Final  . Immature Granulocyte % 11/02/2018 0.2 <=0.7 % Final  . Immature Granulocyte Count 11/02/2018 0.03 <=0.06 10^3/L Final  . Thyroid Stimulating Hormone (TSH) 11/02/2018 1.181 0.450-5.330 uIU/ml uIU/mL Final   . Color 11/02/2018 Yellow Yellow Final  . Clarity 11/02/2018 SL Cloudy* Clear Final  . Specific Gravity 11/02/2018 1.025 1.000 - 1.030 Final  . pH, Urine 11/02/2018 5.5 5.0 - 8.0 Final  . Protein, Urinalysis 11/02/2018 Trace Negative, Trace mg/dL Final  . Glucose, Urinalysis 11/02/2018 Negative Negative mg/dL Final  . Ketones, Urinalysis 11/02/2018 Negative Negative mg/dL Final  . Blood, Urinalysis 11/02/2018 Small* Negative Final  . Nitrite, Urinalysis 11/02/2018 Negative Negative Final  . Leukocyte Esterase, Urinalysis 11/02/2018 Small* Negative Final  . White Blood Cells, Urinalysis 11/02/2018 >50* None Seen, 0-3 /hpf Final  . Red Blood Cells,  Urinalysis 11/02/2018 0-3 None Seen, 0-3 /hpf Final  . Bacteria, Urinalysis 11/02/2018 Many* None Seen /hpf Final  . Squamous Epithelial Cells, Urinaly* 11/02/2018 Few Rare, Few, None Seen /hpf Final  . Urine Culture, Routine - [facility] 11/02/2018 Final report* Final  . Result 1 - [facility] 11/02/2018 Citrobacter koseri* Final  . Antimicrobial Susceptibility - Lab* 11/02/2018 Comment Final   ASSESSMENT AND PLAN:  Diagnoses and all orders for this visit:  Routine general medical examination at a health care facility  Health care maintenance  HTN (hypertension), benign Assessment & Plan: Bp has been better of late  Orders: - Vitamin B12 - Comprehensive Metabolic Panel (CMP) - Lipid Panel w/calc LDL - CBC w/auto Differential (5 Part) - Ambulatory Referral to Neurology  Hypercholesterolemia Assessment & Plan: Healthy fat diet is being followed and no myalgia's are noted.   Orders: - Vitamin B12 - Comprehensive Metabolic Panel (CMP) - Lipid Panel w/calc LDL - CBC w/auto Differential (5 Part) - Ambulatory Referral to Neurology  MCI (mild cognitive impairment) Assessment & Plan: Decreased energy, mood flat. Slowly worse and some shaking of hands  Orders: - Vitamin B12 - Comprehensive Metabolic Panel (CMP) - Lipid Panel w/calc  LDL - CBC w/auto Differential (5 Part)  Hernia, inguinal, left - Ambulatory Referral to General Surgery  Goals  . Exercise (x goals) (pt-stated)  Knee exercises daily   . Maintain health/healthy lifestyle    Fu 7 weeks post consults, rule out depression but may be more memory etc. Bugs crawling on skin noted also    Electronically signed by Travis Donath, MD at 05/10/2019 12:05 PM EST

## 2019-05-30 NOTE — Pre-Procedure Instructions (Signed)
Progress Notes - documented in this encounter Travis Amato, NP - 05/24/2019 1:15 PM EST Formatting of this note might be different from the original. Today the history is gathered from: 50% - patient  50% - patients daughter  RECORDS SUMMARY: I have reviewed the note dated 05/10/2019 from Dr. Frazier [name] who has indicated: reports decreased energy, mood flat and slowly worse memory. Some shaking in hands. Given these abnormal neurologic findings, a referral to neurology has been recommended.  REFERRING PHYSICIAN: Linwood [name]* PRIMARY CARE PHYSICIAN: Travis Donath, MD  IMPRESSION/PLAN  Travis [name] is a 86 y.o. male presenting for evaluation of   TREMOR / [name] / SHAKING - New to me.  - Discussed patient's concerns of abnormal sensation of his skin, tremor, memory loss, and balance concerns.  - Patient with worsening memory after knee surgery in 2017. Patient had several back to back surgeries.  - Discussed possible Parkinson's with patient. Tremor is more like benign tremor.  - Will order brain MRI without contrast to rule out structural abnormalities.  - Reviewed recent labs with patient. Patient with previous vitamin b12 deficiency.  -We will order the following labs today. Vitamin B12, Thiamine, Vitamin D, Ferritin, Folate, TSH A1C.  - Discussed possible neuropathy with patient. Patient has crawling sensation of skin in feet and legs. He also has concerns with his balance. TSH  - Will order EMG of lower extremities.  - Discussed mood with patient. He was previously prescribed Wellbutrin for his mood patient reports noticing no changes on this medication.  - Will continue to monitor.   Follow-up with Travis Herb, NP in 6-8 weeks   I personally answered all questions concerning this patient's care today and addressed any concerns before ending the visit. The patient verbalized understanding.   CHIEF COMPLAINT & HPI  [name] is a 86 y.o. male presenting for evaluation of: Chief Complaint  Patient presents with  . Shaking  . Tremors  . sensation of crawling skin   TREMOR / CRAWLING SENSATION OF SKIN / SHAKING  Patient is at office visit with his daughter. He states that for the last several years he has not been feeling the best. He has noticed a tremor in his jaw and shaking in his right hand. The tremor in his hand had effected his ability to write. Patient also has an abnormal sensation of his skin. He feels that his skin is crawling. He also feels that he is off balance at times. He also has trouble with his mood in the last several months. His daughter states that he has a declined mood. He states that he has trouble with his memory. He will have trouble with train of thought. He will be talking and then loose what he wants to say. Patient's daughter is concerned that when patient had a knee replacement three years ago that this could have caused these symptoms. She reports that patient did not tolerate the surgery well. He had vivid hallucinations and severe memory loss for a period of time after his knee surgery.   DATA SUMMARY:   VISIT SUMMARIES:  MEDICATIONS Outpatient Medications Marked as Taking for the 05/24/19 encounter (Initial consult) with Travis Amato, NP  Medication Sig  . cyanocobalamin (VITAMIN B12) 1000 MCG tablet Take 1,000 mcg by mouth once daily.  Marland Kitchen oxybutynin (DITROPAN-XL) 10 MG XL tablet Take 10 mg by mouth once daily   ALLERGIES No Known Allergies  EXAM   Vitals:  05/24/19 1342  BP: 125/85  Pulse: 63  Resp: 18  Weight: 85.7 kg (189 lb)  Height: 175.3 cm (5' 9")  PainSc: 0-No pain   Body mass index is 27.91 kg/m.  GENERAL: Very pleasant man, in NAD Normocephalic and atraumatic.  EYES: Funduscopic exam shows normal disc size, appearance and C/D ratio without clear evidence of papilledema. Pupils equal and reactive  CARDIOVASCULAR: S1 and S2 sounds are  within normal limits, without murmurs, gallops, or rubs. RRR  MUSCULOSKELETAL: Bulk - Normal Tone - Normal Pronator Drift - Absent bilaterally. Ambulation - Gait and station is generally steady, decreased bilateral arm swing, slow turn Romberg - not tested for safety  R/L 5/5 Shoulder abduction (deltoid/supraspinatus, axillary/suprascapular n, C5) 5/5 Elbow flexion (biceps brachii, musculoskeletal n, C5-6) 5/5 Elbow extension (triceps, radial n, C7) 5/5 Finger adduction (interossei, ulnar n, T1)  5/5 Hip flexion (iliopsoas, L1/L2) 5/5 Knee flexion (hamstrings, sciatic n, L5/S1)  5/5 Knee extension (quadriceps, femoral n, L3/4) 5/5 Ankle dorsiflexion (tibialis anterior, deep fibular n, L4/5) 5/5 Ankle plantarflexion (gastroc, tibial n, S1)   NEUROLOGICAL: MENTAL STATUS: Patient is oriented to person, place and time.  Short-term memory is intact Long-term memory is intact.  Attention span and concentration are intact.  Naming and repetition are intact. Comprehension is intact.  Expressive speech is intact.  Patient's fund of knowledge is within normal limits for educational level.  CRANIAL NERVES: Visual acuity and visual fields are intact  Extraocular muscles are intact  Facial sensation is intact bilaterally  Facial strength is intact bilaterally  Hearing is intact bilaterally  Palate elevates midline, normal phonation  Shoulder shrug strength is intact   COORDINATION/CEREBELLAR: Finger to nose testing is not tested today   PAST MEDICAL HISTORY Past Medical History:  Diagnosis Date  . Atypical chest pain  . GERD (gastroesophageal reflux disease)  . History of goiter  . Hyperlipidemia  . Hypertension  . Seasonal allergies   PAST SURGICAL HISTORY Past Surgical History:  Procedure Laterality Date  . ARTHROPLASTY TOTAL KNEE Left 03/18/2016  . HERNIA REPAIR  . Surgery on eye   FAMILY HISTORY Family History  Problem Relation Age of Onset  . Myocardial  Infarction (Heart attack) Mother  . Stroke Father  . Lung cancer Brother  . Diabetes type II Other  . Glaucoma Other   SOCIAL HISTORY  Social History   Tobacco Use  . Smoking status: Never Smoker  . Smokeless tobacco: Never Used  Substance Use Topics  . Alcohol use: No  Alcohol/week: 0.0 standard drinks  . Drug use: No   REVIEW OF SYSTEMS:  13 system ROS form was given to the patient to complete and I have reviewed it. The form was sent for scan to the patient's EHR. Pertinent positives and negatives are mentioned above in the HPI and all other systems are negative.  DATA  I have personally reviewed all of the data outlined below both prior to the appointment and during the appointment with the patient as appropriate.  Office Visit on 05/10/2019  Component Date Value Ref Range Status  . Vitamin B12 05/10/2019 141* >300 pg/mL Final  . Glucose 05/10/2019 78 70 - 110 mg/dL Final  . Sodium 05/10/2019 141 136 - 145 mmol/L Final  . Potassium 05/10/2019 4.4 3.6 - 5.1 mmol/L Final  . Chloride 05/10/2019 103 97 - 109 mmol/L Final  . Carbon Dioxide (CO2) 05/10/2019 30.2 22.0 - 32.0 mmol/L Final  . Urea Nitrogen (BUN) 05/10/2019 14 7 - 25 mg/dL Final  .  Creatinine 05/10/2019 1.0 0.7 - 1.3 mg/dL Final  . Glomerular Filtration Rate (eGFR),* 05/10/2019 86 >60 mL/min/1.73sq m Final  . Calcium 05/10/2019 9.9 8.7 - 10.3 mg/dL Final  . AST 05/10/2019 13 8 - 39 U/L Final  . ALT 05/10/2019 9 6 - 57 U/L Final  . Alk Phos (alkaline Phosphatase) 05/10/2019 88 34 - 104 U/L Final  . Albumin 05/10/2019 4.4 3.5 - 4.8 g/dL Final  . Bilirubin, Total 05/10/2019 0.7 0.3 - 1.2 mg/dL Final  . Protein, Total 05/10/2019 6.1 6.1 - 7.9 g/dL Final  . A/G Ratio 05/10/2019 2.6 1.0 - 5.0 gm/dL Final  . Cholesterol, Total 05/10/2019 160 100 - 200 mg/dL Final  . Triglyceride 05/10/2019 50 35 - 199 mg/dL Final  . HDL (High Density Lipoprotein) Cho* 05/10/2019 47.9 29.0 - 71.0 mg/dL Final  . LDL Calculated  05/10/2019 102 0 - 130 mg/dL Final  . VLDL Cholesterol 05/10/2019 10 mg/dL Final  . Cholesterol/HDL Ratio 05/10/2019 3.3 Final  . WBC (White Blood Cell Count) 05/10/2019 4.8 4.1 - 10.2 10^3/uL Final  . RBC (Red Blood Cell Count) 05/10/2019 5.58 4.69 - 6.13 10^6/uL Final  . Hemoglobin 05/10/2019 13.8* 14.1 - 18.1 gm/dL Final  . Hematocrit 05/10/2019 44.5 40.0 - 52.0 % Final  . MCV (Mean Corpuscular Volume) 05/10/2019 79.7* 80.0 - 100.0 fl Final  . MCH (Mean Corpuscular Hemoglobin) 05/10/2019 24.7* 27.0 - 31.2 pg Final  . MCHC (Mean Corpuscular Hemoglobin * 05/10/2019 31.0* 32.0 - 36.0 gm/dL Final  . Platelet Count 05/10/2019 206 150 - 450 10^3/uL Final  . RDW-CV (Red Cell Distribution Widt* 05/10/2019 15.3* 11.6 - 14.8 % Final  . MPV (Mean Platelet Volume) 05/10/2019 11.0 9.4 - 12.4 fl Final  . Neutrophils 05/10/2019 2.32 1.50 - 7.80 10^3/uL Final  . Lymphocytes 05/10/2019 1.94 1.00 - 3.60 10^3/uL Final  . Monocytes 05/10/2019 0.45 0.00 - 1.50 10^3/uL Final  . Eosinophils 05/10/2019 0.10 0.00 - 0.55 10^3/uL Final  . Basophils 05/10/2019 0.02 0.00 - 0.09 10^3/uL Final  . Neutrophil % 05/10/2019 47.9 32.0 - 70.0 % Final  . Lymphocyte % 05/10/2019 40.1 10.0 - 50.0 % Final  . Monocyte % 05/10/2019 9.3 4.0 - 13.0 % Final  . Eosinophil % 05/10/2019 2.1 1.0 - 5.0 % Final  . Basophil% 05/10/2019 0.4 0.0 - 2.0 % Final  . Immature Granulocyte % 05/10/2019 0.2 <=0.7 % Final  . Immature Granulocyte Count 05/10/2019 0.01 <=0.06 10^3/L Final   No follow-ups on file.  Payor: [place] / Plan: [place] / Product Type: HMO /   This note is partially written by Jake Shark in the presence of and acting as the scribe of Travis Herb NP, who has reviewed, edited and added to the note to reflect her best personal medical judgment.  I have reviewed, edited and added to the note as needed to reflect my best personal medical judgment.  Nicola Girt. Kristine Linea, [employer]  Board Certified Nurse Practitioner  [place], [place] [address] Phone: [phone] Fax: [phone]   Electronically signed by Travis Amato, NP at 05/25/2019 9:08 PM EST   Plan of Treatment - documented as of this encounter Upcoming Encounters Upcoming Encounters  Date Type Specialty Care Team Description  06/14/2019 [facility] [place]ine Bears, [place]  [place], [place] [address]  [phone]  [phone] (Fax)    06/15/2019 Procedure visit Neurology Vickki Hearing,  MD  [place]  [facility]  [place], [place] [address]  [phone]  [phone] (Fax)    06/29/2019 Office Visit Internal Medicine O[place]ida S[place] Vallarie Mare, MD  [place]  [facility] [place], [place] [address]  [phone]  [phone] ([phone]    07/05/2019 Office Visit Neurology Travis Amato, NP  [address] [place]  [place], [place] [address]  [phone]  [phone] (Fax)    08/09/2019 Ancillary Orders Lab     Scheduled Orders Scheduled Orders  Name Type Priority Associated Diagnoses Order Schedule  MRI brain without contrast Imaging Routine Memory loss of unknown cause  Expected: 05/24/2019, Expires: 05/23/2020

## 2019-05-30 NOTE — Patient Instructions (Signed)
Your procedure is scheduled on: 06-01-19 [facility] Report to Same Day Surgery 2nd floor medical mall [facility] Entrance-take elevator on left to 2nd floor.  Check in with surgery information desk.) To find out your arrival time please call [phone] between 1PM - 3PM on 05-31-19 TUESDAY  Remember: Instructions that are not followed completely may result in serious medical risk, up to and including death, or upon the discretion of your surgeon and anesthesiologist your surgery may need to be rescheduled.    _x___ 1. Do not eat food after midnight the night before your procedure. NO GUM OR CANDY AFTER MIDNIGHT. You may drink clear liquids up to 2 hours before you are scheduled to arrive at the hospital for your procedure.  Do not drink clear liquids within 2 hours of your scheduled arrival to the hospital.  Clear liquids include  --Water or Apple juice without pulp  --Gatorade  --Black Coffee or Clear Tea (No milk, no creamers, do not add anything to the coffee or Tea   ____Ensure clear carbohydrate drink on the way to the hospital for bariatric patients  ____Ensure clear carbohydrate drink 3 hours before surgery.     __x__ 2. No Alcohol for 24 hours before or after surgery.   __x__3. No Smoking or e-cigarettes for 24 prior to surgery.  Do not use any chewable tobacco products for at least 6 hour prior to surgery   ____  4. Bring all medications with you on the day of surgery if instructed.    __x__ 5. Notify your doctor if there is any change in your medical condition     (cold, fever, infections).    x___6. On the morning of surgery brush your teeth with toothpaste and water.  You may rinse your mouth with mouth wash if you wish.  Do not swallow any toothpaste or mouthwash.   Do not wear jewelry, make-up, hairpins, clips or nail polish.  Do not wear lotions, powders, or perfumes.   Do not shave 48 hours prior to surgery. Men may shave face and neck.  Do not bring valuables to  the hospital.    [facility] is not responsible for any belongings or valuables.               Contacts, dentures or bridgework may not be worn into surgery.  Leave your suitcase in the car. After surgery it may be brought to your room.  For patients admitted to the hospital, discharge time is determined by your treatment team.  _  Patients discharged the day of surgery will not be allowed to drive home.  You will need someone to drive you home and stay with you the night of your procedure.    Please read over the following fact sheets that you were given:   [facility] Preparing for Surgery and or MRSA Information   _x___ TAKE THE FOLLOWING MEDICATION THE MORNING OF SURGERY WITH A SMALL SIP OF WATER. These include:  1. DITROPAN (OXYBUTYNIN)  2.  3.  4.  5.  6.  ____Fleets enema or Magnesium Citrate as directed.   _x___ Use CHG Soap or sage wipes as directed on instruction sheet   ____ Use inhalers on the day of surgery and bring to hospital day of surgery  ____ Stop Metformin and Janumet 2 days prior to surgery.    ____ Take 1/2 of usual insulin dose the night before surgery and none on the morning surgery.   ____ Follow recommendations from  Cardiologist, Pulmonologist or PCP regarding stopping Aspirin, Coumadin, Plavix ,Eliquis, Effient, or Pradaxa, and Pletal.  X____Stop Anti-inflammatories such as Advil, Aleve, Ibuprofen, Motrin, Naproxen, Naprosyn, Goodies powders or aspirin products NOW-OK to take Tylenol    ____ Stop supplements until after surgery.     ____ Bring C-Pap to the hospital.

## 2019-05-31 ENCOUNTER — Other Ambulatory Visit: Payer: Medicare Other

## 2019-05-31 ENCOUNTER — Encounter
Admission: RE | Admit: 2019-05-31 | Discharge: 2019-05-31 | Disposition: A | Discharge status: Home / Self Care | Payer: Medicare Other | Source: Ambulatory Visit | Attending: General Surgery | Admitting: General Surgery

## 2019-05-31 DIAGNOSIS — Z01818 Encounter for other preprocedural examination: Secondary | ICD-10-CM | POA: Insufficient documentation

## 2019-06-01 ENCOUNTER — Ambulatory Visit
Admission: RE | Admit: 2019-06-01 | Discharge: 2019-06-01 | Disposition: A | Discharge status: Home / Self Care | Payer: Medicare Other | Source: Ambulatory Visit | Attending: General Surgery | Admitting: General Surgery

## 2019-06-01 ENCOUNTER — Encounter: Payer: Self-pay | Admitting: General Surgery

## 2019-06-01 ENCOUNTER — Encounter
Admission: RE | Disposition: A | Discharge status: Home / Self Care | Payer: Self-pay | Source: Ambulatory Visit | Attending: General Surgery

## 2019-06-01 ENCOUNTER — Other Ambulatory Visit: Payer: Self-pay

## 2019-06-01 ENCOUNTER — Ambulatory Visit: Payer: Medicare Other | Admitting: Anesthesiology

## 2019-06-01 DIAGNOSIS — D176 Benign lipomatous neoplasm of spermatic cord: Secondary | ICD-10-CM | POA: Diagnosis not present

## 2019-06-01 DIAGNOSIS — Z96652 Presence of left artificial knee joint: Secondary | ICD-10-CM | POA: Insufficient documentation

## 2019-06-01 DIAGNOSIS — K4091 Unilateral inguinal hernia, without obstruction or gangrene, recurrent: Secondary | ICD-10-CM | POA: Diagnosis not present

## 2019-06-01 DIAGNOSIS — E538 Deficiency of other specified B group vitamins: Secondary | ICD-10-CM | POA: Insufficient documentation

## 2019-06-01 DIAGNOSIS — Z8551 Personal history of malignant neoplasm of bladder: Secondary | ICD-10-CM | POA: Insufficient documentation

## 2019-06-01 DIAGNOSIS — I10 Essential (primary) hypertension: Secondary | ICD-10-CM | POA: Insufficient documentation

## 2019-06-01 HISTORY — PX: INGUINAL HERNIA REPAIR: SHX194

## 2019-06-01 SURGERY — REPAIR, HERNIA, INGUINAL, ADULT
Anesthesia: General | Site: Groin | Laterality: Left

## 2019-06-01 MED ORDER — PROPOFOL 10 MG/ML IV BOLUS
INTRAVENOUS | Status: DC | PRN
  Administered 2019-06-01: 160 mg via INTRAVENOUS

## 2019-06-01 MED ORDER — OXYCODONE HCL 5 MG/5ML PO SOLN: 5.0000 mg | Freq: Once | ORAL | Status: AC | PRN

## 2019-06-01 MED ORDER — LIDOCAINE HCL (CARDIAC) PF 100 MG/5ML IV SOSY
PREFILLED_SYRINGE | INTRAVENOUS | Status: DC | PRN
  Administered 2019-06-01: 100 mg via INTRAVENOUS

## 2019-06-01 MED ORDER — ONDANSETRON HCL 4 MG/2ML IJ SOLN
INTRAMUSCULAR | Status: DC | PRN
  Administered 2019-06-01: 4 mg via INTRAVENOUS

## 2019-06-01 MED ORDER — EPINEPHRINE PF 1 MG/ML IJ SOLN
INTRAMUSCULAR | Status: AC
  Filled 2019-06-01: qty 1

## 2019-06-01 MED ORDER — FENTANYL CITRATE (PF) 100 MCG/2ML IJ SOLN
INTRAMUSCULAR | Status: AC
  Administered 2019-06-01: 25 ug via INTRAVENOUS
  Filled 2019-06-01: qty 2

## 2019-06-01 MED ORDER — LIDOCAINE HCL (PF) 2 % IJ SOLN
INTRAMUSCULAR | Status: AC
  Filled 2019-06-01: qty 5

## 2019-06-01 MED ORDER — ROCURONIUM BROMIDE 50 MG/5ML IV SOLN
INTRAVENOUS | Status: AC
  Filled 2019-06-01: qty 1

## 2019-06-01 MED ORDER — CEFAZOLIN SODIUM-DEXTROSE 2-4 GM/100ML-% IV SOLN
2.0000 g | INTRAVENOUS | Status: AC
  Administered 2019-06-01: 2 g via INTRAVENOUS

## 2019-06-01 MED ORDER — FENTANYL CITRATE (PF) 100 MCG/2ML IJ SOLN
INTRAMUSCULAR | Status: DC | PRN
  Administered 2019-06-01: 50 ug via INTRAVENOUS
  Administered 2019-06-01: 25 ug via INTRAVENOUS

## 2019-06-01 MED ORDER — ONDANSETRON HCL 4 MG/2ML IJ SOLN
INTRAMUSCULAR | Status: AC
  Filled 2019-06-01: qty 2

## 2019-06-01 MED ORDER — ACETAMINOPHEN 10 MG/ML IV SOLN
INTRAVENOUS | Status: AC
  Filled 2019-06-01: qty 100

## 2019-06-01 MED ORDER — DEXAMETHASONE SODIUM PHOSPHATE 10 MG/ML IJ SOLN
INTRAMUSCULAR | Status: DC | PRN
  Administered 2019-06-01: 8 mg via INTRAVENOUS

## 2019-06-01 MED ORDER — KETOROLAC TROMETHAMINE 30 MG/ML IJ SOLN
INTRAMUSCULAR | Status: AC
  Filled 2019-06-01: qty 1

## 2019-06-01 MED ORDER — FENTANYL CITRATE (PF) 100 MCG/2ML IJ SOLN
INTRAMUSCULAR | Status: AC
  Filled 2019-06-01: qty 2

## 2019-06-01 MED ORDER — FENTANYL CITRATE (PF) 100 MCG/2ML IJ SOLN: 25.0000 ug | INTRAMUSCULAR | Status: DC | PRN

## 2019-06-01 MED ORDER — OXYCODONE HCL 5 MG PO TABS
5.0000 mg | ORAL_TABLET | Freq: Once | ORAL | Status: AC | PRN
  Administered 2019-06-01: 5 mg via ORAL

## 2019-06-01 MED ORDER — DEXAMETHASONE SODIUM PHOSPHATE 10 MG/ML IJ SOLN
INTRAMUSCULAR | Status: AC
  Filled 2019-06-01: qty 1

## 2019-06-01 MED ORDER — EPHEDRINE SULFATE 50 MG/ML IJ SOLN
INTRAMUSCULAR | Status: DC | PRN
  Administered 2019-06-01: 7.5 mg via INTRAVENOUS
  Administered 2019-06-01: 2.5 mg via INTRAVENOUS

## 2019-06-01 MED ORDER — FAMOTIDINE 20 MG PO TABS
ORAL_TABLET | ORAL | Status: AC
  Administered 2019-06-01: 20 mg via ORAL
  Filled 2019-06-01: qty 1

## 2019-06-01 MED ORDER — PROPOFOL 10 MG/ML IV BOLUS
INTRAVENOUS | Status: AC
  Filled 2019-06-01: qty 20

## 2019-06-01 MED ORDER — KETOROLAC TROMETHAMINE 30 MG/ML IJ SOLN
INTRAMUSCULAR | Status: DC | PRN
  Administered 2019-06-01: 15 mg via INTRAVENOUS

## 2019-06-01 MED ORDER — ROCURONIUM BROMIDE 100 MG/10ML IV SOLN
INTRAVENOUS | Status: DC | PRN
  Administered 2019-06-01: 40 mg via INTRAVENOUS

## 2019-06-01 MED ORDER — FAMOTIDINE 20 MG PO TABS: 20.0000 mg | ORAL_TABLET | Freq: Once | ORAL | Status: AC

## 2019-06-01 MED ORDER — SUGAMMADEX SODIUM 200 MG/2ML IV SOLN
INTRAVENOUS | Status: DC | PRN
  Administered 2019-06-01: 200 mg via INTRAVENOUS

## 2019-06-01 MED ORDER — BUPIVACAINE HCL (PF) 0.5 % IJ SOLN
INTRAMUSCULAR | Status: DC | PRN
  Administered 2019-06-01: 30 mL

## 2019-06-01 MED ORDER — SUCCINYLCHOLINE CHLORIDE 20 MG/ML IJ SOLN
INTRAMUSCULAR | Status: AC
  Filled 2019-06-01: qty 1

## 2019-06-01 MED ORDER — ACETAMINOPHEN 10 MG/ML IV SOLN
INTRAVENOUS | Status: DC | PRN
  Administered 2019-06-01: 1000 mg via INTRAVENOUS

## 2019-06-01 MED ORDER — CEFAZOLIN SODIUM-DEXTROSE 2-4 GM/100ML-% IV SOLN
INTRAVENOUS | Status: AC
  Filled 2019-06-01: qty 100

## 2019-06-01 MED ORDER — BUPIVACAINE HCL (PF) 0.5 % IJ SOLN
INTRAMUSCULAR | Status: AC
  Filled 2019-06-01: qty 30

## 2019-06-01 MED ORDER — SUGAMMADEX SODIUM 200 MG/2ML IV SOLN
INTRAVENOUS | Status: AC
  Filled 2019-06-01: qty 2

## 2019-06-01 MED ORDER — LACTATED RINGERS IV SOLN
INTRAVENOUS | Status: DC
  Administered 2019-06-01: 08:00:00 50 mL/h via INTRAVENOUS

## 2019-06-01 MED ORDER — OXYCODONE HCL 5 MG PO TABS
ORAL_TABLET | ORAL | Status: AC
  Filled 2019-06-01: qty 1

## 2019-06-01 MED ORDER — ONDANSETRON HCL 4 MG/2ML IJ SOLN: 4.0000 mg | Freq: Once | INTRAMUSCULAR | Status: DC | PRN

## 2019-06-01 MED ORDER — HYDROCODONE-ACETAMINOPHEN 5-325 MG PO TABS: 1.0000 | ORAL_TABLET | ORAL | 0 refills | Status: AC | PRN

## 2019-06-01 SURGICAL SUPPLY — 30 items
BLADE SURG 15 STRL LF DISP TIS (BLADE) ×1 IMPLANT
BLADE SURG 15 STRL SS (BLADE) ×1
CANISTER SUCT 1200ML W/VALVE (MISCELLANEOUS) ×2 IMPLANT
CHLORAPREP W/TINT 26 (MISCELLANEOUS) ×2 IMPLANT
COVER WAND RF STERILE (DRAPES) ×1 IMPLANT
DERMABOND ADVANCED (GAUZE/BANDAGES/DRESSINGS) ×1
DERMABOND ADVANCED .7 DNX12 (GAUZE/BANDAGES/DRESSINGS) ×1 IMPLANT
DRAIN PENROSE 1/4X12 LTX STRL (WOUND CARE) ×2 IMPLANT
DRAPE LAPAROTOMY 100X77 ABD (DRAPES) ×2 IMPLANT
ELECT REM PT RETURN 9FT ADLT (ELECTROSURGICAL) ×2
ELECTRODE REM PT RTRN 9FT ADLT (ELECTROSURGICAL) ×1 IMPLANT
GLOVE BIO SURGEON STRL SZ 6.5 (GLOVE) ×4 IMPLANT
GLOVE BIOGEL PI IND STRL 6.5 (GLOVE) ×1 IMPLANT
GLOVE BIOGEL PI INDICATOR 6.5 (GLOVE) ×1
GOWN STRL REUS W/ TWL LRG LVL3 (GOWN DISPOSABLE) ×2 IMPLANT
GOWN STRL REUS W/TWL LRG LVL3 (GOWN DISPOSABLE) ×2
LABEL OR SOLS (LABEL) ×2 IMPLANT
MESH HERNIA 6X13 (Mesh General) ×1 IMPLANT
NEEDLE HYPO 22GX1.5 SAFETY (NEEDLE) ×2 IMPLANT
NS IRRIG 500ML POUR BTL (IV SOLUTION) ×2 IMPLANT
PACK BASIN MINOR ARMC (MISCELLANEOUS) ×2 IMPLANT
SUT MNCRL 4-0 (SUTURE) ×1
SUT MNCRL 4-0 27XMFL (SUTURE) ×1
SUT SURGILON 0 BLK (SUTURE) ×4 IMPLANT
SUT VIC AB 2-0 BRD 54 (SUTURE) ×2 IMPLANT
SUT VIC AB 2-0 CT2 27 (SUTURE) ×2 IMPLANT
SUT VIC AB 3-0 SH 27 (SUTURE) ×2
SUT VIC AB 3-0 SH 27X BRD (SUTURE) ×2 IMPLANT
SUTURE MNCRL 4-0 27XMF (SUTURE) ×1 IMPLANT
SYR 10ML LL (SYRINGE) ×2 IMPLANT

## 2019-06-01 NOTE — Discharge Instructions (Signed)
  Diet: Resume home heart healthy regular diet.   Activity: No heavy lifting >20 pounds (children, pets, laundry, garbage) or strenuous activity until follow-up, but light activity and walking are encouraged. Do not drive or drink alcohol if taking narcotic pain medications.  Wound care: May shower with soapy water and pat dry (do not rub incisions), but no baths or submerging incision underwater until follow-up. (no swimming)   Medications: Resume all home medications. For mild to moderate pain: acetaminophen (Tylenol) or ibuprofen (if no kidney disease). Combining Tylenol with alcohol can substantially increase your risk of causing liver disease. Narcotic pain medications, if prescribed, can be used for severe pain, though may cause nausea, constipation, and drowsiness. Do not combine Tylenol and Norco within a 6 hour period as Norco contains Tylenol. If you do not need the narcotic pain medication, you do not need to fill the prescription.  Call office ([phone]) at any time if any questions, worsening pain, fevers/chills, bleeding, drainage from incision site, or other concerns.   AMBULATORY SURGERY  DISCHARGE INSTRUCTIONS   1) The drugs that you were given will stay in your system until tomorrow so for the next 24 hours you should not:  A) Drive an automobile B) Make any legal decisions C) Drink any alcoholic beverage   2) You may resume regular meals tomorrow.  Today it is better to start with liquids and gradually work up to solid foods.  You may eat anything you prefer, but it is better to start with liquids, then soup and crackers, and gradually work up to solid foods.   3) Please notify your doctor immediately if you have any unusual bleeding, trouble breathing, redness and pain at the surgery site, drainage, fever, or pain not relieved by medication.    4) Additional Instructions:        Please contact your physician with any problems or Same Day Surgery at  [phone], Monday through Friday 6 am to 4 pm, or  at [place] Main number at [phone]. 

## 2019-06-01 NOTE — Anesthesia Postprocedure Evaluation (Signed)
Anesthesia Post Note  Patient: Travis [name]  Procedure(s) Performed: HERNIA REPAIR INGUINAL ADULT (Left Groin)  Patient location during evaluation: PACU Anesthesia Type: General Level of consciousness: awake and alert and oriented Pain management: pain level controlled Vital Signs Assessment: post-procedure vital signs reviewed and stable Respiratory status: spontaneous breathing, nonlabored ventilation and respiratory function stable Cardiovascular status: blood pressure returned to baseline and stable Postop Assessment: no signs of nausea or vomiting Anesthetic complications: no     Last Vitals:  Vitals:   06/01/19 1117 06/01/19 1132  BP: (!) 141/74 (!) 143/79  Pulse: (!) 59 61  Resp: 12 15  Temp: 37.1 C   SpO2: 96% 97%    Last Pain:  Vitals:   06/01/19 1132  TempSrc:   PainSc: 4                  Cynara Tatham

## 2019-06-01 NOTE — Op Note (Signed)
Preoperative diagnosis: Left recurrent Inguinal Hernia.  Postoperative diagnosis: Left recurrent Indirect Inguinal Hernia.  Procedure: Left Recurrent Inguinal hernia repair with mesh  Anesthesia: General  Surgeon: Dr. Windell Moment  Wound Classification: Clean  Indications:  Patient is a 86 y.o. male developed a symptomatic left recurrent inguinal hernia. Repair was indicated to avoid complications of incarceration, obstruction and pain, and a prosthetic mesh repair was elected.  Findings: 1. Vas Deferens and cord structures identified and preserved 2. An indirect inguinal hernia was identified 3. Adequate hemostasis achieved  Description of procedure: The patient was taken to the operating room. A time-out was completed verifying correct patient, procedure, site, positioning, and implant(s) and/or special equipment prior to beginning this procedure. General anesthesia was induced. The left groin was prepped and draped in the usual sterile fashion. An incision was marked in a natural skin crease and planned to end near the pubic tubercle.  The skin crease incision was made with a knife and deepened through Scarpa's and Camper's fascia with electrocautery until the aponeurosis of the external oblique was encountered. This was cleaned and the external ring was exposed. Hemostasis was achieved in the wound. An incision was made in the midportion of the external oblique aponeurosis in the direction of its fibers. The ilioinguinal nerve was identified and protected throughout the dissection. Flaps of the external oblique were developed cephalad and inferiorly.  The cord was identified. It was gently dissected free at the pubic tubercle and encircled with a Penrose drain. Attention was directed to the anteromedial aspect of the cord, where an indirect hernia sac was identified. The sac was carefully dissected free of the cord down to the level of the internal ring. The vas and testicular vessels were  identified and protected from harm. The sac was opened and contents were reduced. A finger was passed into the peritoneal cavity and the floor of the inguinal canal assessed and found to be strong. The femoral canal was palpated and no hernia identified. The sac was reduced. A cord lipoma was excised and submitted to pathology. The Large pre shaped mesh was inserted. Beginning at the pubic tubercle, the mesh was sutured to the inguinal ligament inferiorly and the conjoint tendon superiorly using interrupted 0 nonabsorbable sutures. Care was taken to assure that the mesh was placed in a relaxed fashion to avoid excessive tension and that no neurovascular structures were caught in the repair. Laterally, the tails of the mesh were crossed and the internal ring recreated.  Hemostasis was again checked. The Penrose drain was removed. The external oblique aponeurosis was closed with a running suture of 3-0 Vicryl, taking care not to catch the ilioinguinal nerve in the suture line. Scarpa's fascia was closed with interrupted 3-0 Vicryl.  The skin was closed with a subcuticular stitch of Monocryl 4-0. Dermabond was applied.  The testis was gently pulled down into its anatomic position in the scrotum.  The patient tolerated the procedure well and was taken to the postanesthesia care unit in stable condition.   Specimen: Cord lipoma  Complications: None  Estimated Blood Loss:  5 mL

## 2019-06-01 NOTE — Transfer of Care (Signed)
Immediate Anesthesia Transfer of Care Note  Patient: Travis [name]  Procedure(s) Performed: HERNIA REPAIR INGUINAL ADULT (Left Groin)  Patient Location: PACU  Anesthesia Type:General  Level of Consciousness: sedated  Airway & Oxygen Therapy: Patient Spontanous Breathing and Patient connected to face mask oxygen  Post-op Assessment: Report given to RN and Post -op Vital signs reviewed and stable  Post vital signs: Reviewed and stable  Last Vitals:  Vitals Value Taken Time  BP 148/77 06/01/19 1032  Temp 36.8 C 06/01/19 1032  Pulse 59 06/01/19 1033  Resp 14 06/01/19 1033  SpO2 100 % 06/01/19 1033  Vitals shown include unvalidated device data.  Last Pain:  Vitals:   06/01/19 0721  TempSrc: Tympanic  PainSc: 0-No pain         Complications: No apparent anesthesia complications

## 2019-06-01 NOTE — Anesthesia Procedure Notes (Signed)
Procedure Name: Intubation Date/Time: 06/01/2019 8:36 AM Performed by: Johnna Acosta, CRNA Pre-anesthesia Checklist: Patient identified, Emergency Drugs available, Suction available, Patient being monitored and Timeout performed Patient Re-evaluated:Patient Re-evaluated prior to induction Oxygen Delivery Method: Circle system utilized Preoxygenation: Pre-oxygenation with 100% oxygen Induction Type: IV induction Ventilation: Mask ventilation without difficulty Laryngoscope Size: McGraph and 3 Grade View: Grade I Tube type: Oral Tube size: 7.5 mm Number of attempts: 1 Airway Equipment and Method: Stylet,  Video-laryngoscopy and Oral airway Placement Confirmation: ETT inserted through vocal cords under direct vision,  positive ETCO2 and breath sounds checked- equal and bilateral Secured at: 22 cm Dental Injury: Teeth and Oropharynx as per pre-operative assessment

## 2019-06-01 NOTE — Interval H&P Note (Signed)
History and Physical Interval Note:  06/01/2019 7:41 AM  Travis [name]  has presented today for surgery, with the diagnosis of K40.91 unilateral recurrent inguinal hernia w/o obstruction or gangrene.  The various methods of treatment have been discussed with the patient and family. After consideration of risks, benefits and other options for treatment, the patient has consented to  Procedure(s): HERNIA REPAIR INGUINAL ADULT (Left) as a surgical intervention.  The patient's history has been reviewed, patient examined, no change in status, stable for surgery.  I have reviewed the patient's chart and labs.  Questions were answered to the patient's satisfaction.     Herbert Pun

## 2019-06-01 NOTE — Anesthesia Preprocedure Evaluation (Signed)
Anesthesia Evaluation  Patient identified by MRN, date of birth, ID band Patient awake    Reviewed: Allergy & Precautions, NPO status , Patient's Chart, lab work & pertinent test results  History of Anesthesia Complications (+) PROLONGED EMERGENCE and history of anesthetic complications  Airway Mallampati: II  TM Distance: >3 FB Neck ROM: Full    Dental no notable dental hx.    Pulmonary neg pulmonary ROS, neg sleep apnea, neg COPD,    breath sounds clear to auscultation- rhonchi (-) wheezing      Cardiovascular hypertension, Pt. on medications (-) CAD, (-) Past MI, (-) Cardiac Stents and (-) CABG  Rhythm:Regular Rate:Normal - Systolic murmurs and - Diastolic murmurs    Neuro/Psych neg Seizures negative neurological ROS  negative psych ROS   GI/Hepatic negative GI ROS, Neg liver ROS,   Endo/Other  negative endocrine ROSneg diabetes  Renal/GU negative Renal ROS     Musculoskeletal  (+) Arthritis ,   Abdominal (+) - obese,   Peds  Hematology negative hematology ROS (+)   Anesthesia Other Findings Past Medical History: No date: Arthritis No date: Cancer ([place])     Comment:  BLADDER No date: Complication of anesthesia     Comment:  had a hard time recovering from anesthesia after knee               replacement neuro symptoms No date: Hyperlipidemia No date: Hypertension   Reproductive/Obstetrics                             Anesthesia Physical Anesthesia Plan  ASA: II  Anesthesia Plan: General   Post-op Pain Management:    Induction: Intravenous  PONV Risk Score and Plan: 1 and Dexamethasone and Ondansetron  Airway Management Planned: Oral ETT  Additional Equipment:   Intra-op Plan:   Post-operative Plan: Extubation in OR  Informed Consent: I have reviewed the patients History and Physical, chart, labs and discussed the procedure including the risks, benefits and  alternatives for the proposed anesthesia with the patient or authorized representative who has indicated his/her understanding and acceptance.     Dental advisory given  Plan Discussed with: CRNA and Anesthesiologist  Anesthesia Plan Comments:         Anesthesia Quick Evaluation

## 2019-06-02 ENCOUNTER — Ambulatory Visit (HOSPITAL_COMMUNITY): Payer: Medicare Other

## 2019-06-02 ENCOUNTER — Encounter (HOSPITAL_COMMUNITY): Payer: Self-pay

## 2019-06-02 LAB — SURGICAL PATHOLOGY

## 2019-06-21 ENCOUNTER — Other Ambulatory Visit: Payer: Self-pay

## 2019-06-21 ENCOUNTER — Ambulatory Visit (HOSPITAL_COMMUNITY)
Admission: RE | Admit: 2019-06-21 | Discharge: 2019-06-21 | Disposition: A | Discharge status: Home / Self Care | Payer: Medicare Other | Source: Ambulatory Visit | Attending: Acute Care | Admitting: Acute Care

## 2019-06-21 DIAGNOSIS — R413 Other amnesia: Secondary | ICD-10-CM

## 2019-06-22 ENCOUNTER — Other Ambulatory Visit: Payer: Self-pay | Admitting: Neurology

## 2019-06-22 DIAGNOSIS — I729 Aneurysm of unspecified site: Secondary | ICD-10-CM

## 2019-07-04 ENCOUNTER — Ambulatory Visit: Payer: Medicare Other

## 2019-07-05 ENCOUNTER — Ambulatory Visit
Admission: RE | Admit: 2019-07-05 | Discharge: 2019-07-05 | Disposition: A | Discharge status: Home / Self Care | Payer: Medicare Other | Source: Ambulatory Visit | Attending: Neurology | Admitting: Neurology

## 2019-07-05 ENCOUNTER — Other Ambulatory Visit: Payer: Self-pay

## 2019-07-05 DIAGNOSIS — I671 Cerebral aneurysm, nonruptured: Secondary | ICD-10-CM | POA: Diagnosis not present

## 2019-07-05 DIAGNOSIS — I729 Aneurysm of unspecified site: Secondary | ICD-10-CM | POA: Diagnosis present

## 2019-07-12 DIAGNOSIS — G608 Other hereditary and idiopathic neuropathies: Secondary | ICD-10-CM | POA: Insufficient documentation

## 2019-10-31 DIAGNOSIS — R7303 Prediabetes: Secondary | ICD-10-CM | POA: Insufficient documentation

## 2020-03-14 ENCOUNTER — Other Ambulatory Visit: Payer: Self-pay | Admitting: Urology

## 2020-03-17 ENCOUNTER — Other Ambulatory Visit: Payer: Self-pay | Admitting: Urology

## 2020-03-22 ENCOUNTER — Other Ambulatory Visit: Payer: Self-pay | Admitting: Urology

## 2020-04-20 ENCOUNTER — Encounter: Payer: Self-pay | Admitting: Urology

## 2020-04-20 ENCOUNTER — Ambulatory Visit: Payer: Medicare Other | Admitting: Urology

## 2020-04-20 ENCOUNTER — Other Ambulatory Visit: Payer: Self-pay

## 2020-04-20 VITALS — BP 109/68 | HR 60 | Ht 69.0 in | Wt 193.9 lb

## 2020-04-20 DIAGNOSIS — Z8551 Personal history of malignant neoplasm of bladder: Secondary | ICD-10-CM | POA: Diagnosis not present

## 2020-04-20 DIAGNOSIS — R350 Frequency of micturition: Secondary | ICD-10-CM | POA: Diagnosis not present

## 2020-04-20 DIAGNOSIS — R3915 Urgency of urination: Secondary | ICD-10-CM | POA: Diagnosis not present

## 2020-04-20 LAB — BLADDER SCAN AMB NON-IMAGING

## 2020-04-20 NOTE — Progress Notes (Signed)
04/20/2020 11:20 AM   Travis [name] [birth year] [id number]  Referring provider: Kirk Ruths, MD [facility] [place],  [place] [address]  Chief Complaint  Patient presents with  . Medication Refill    HPI:  F/u -   1) h/o High risk bladder ca - TURBT 09/2016 with Dr. Pilar Jarvis for large>6cmHG Ta UCC right bladder tumor. His last CT was 07/21/2018. He was treated with BCG induction in 2018 and maintenance through 2019 and declined further BCG (in part due to [place] in 2020). His last cystoscopy was 10/27/2018. Pt did not f/u and declines further cystoscopy.   2) BPH - CT scan in 2013 with prostate enlargement noted (~100g). He had frequency and urgency and was started on oxybutynin in 2019.   Today he returns with c/o frequency and urgency. He ran out of oxybutynin. He has no weak stream. He has urgency and occ UUI. He does have some confusion and constipation. No gross hematuria.    PMH: Past Medical History:  Diagnosis Date  . Arthritis   . Cancer (HCC)    BLADDER  . Complication of anesthesia    had a hard time recovering from anesthesia after knee replacement neuro symptoms  . Hyperlipidemia   . Hypertension     Surgical History: Past Surgical History:  Procedure Laterality Date  . eye implants  1992  . EYE SURGERY Bilateral    Cataract Extraction with IOL Implants  . HERNIA REPAIR Right 1992   Inguinal Hernia Repair  . INGUINAL HERNIA REPAIR Left 01/03/2016   Procedure: LAPAROSCOPIC INGUINAL HERNIA;  Surgeon: Jules Husbands, MD;  Location: [facility] ORS;  Service: General;  Laterality: Left;  . INGUINAL HERNIA REPAIR Left 06/01/2019   Procedure: HERNIA REPAIR INGUINAL ADULT;  Surgeon: Herbert Pun, MD;  Location: [facility];  Service: General;  Laterality: Left;  . JOINT REPLACEMENT    . TOTAL KNEE ARTHROPLASTY Left 03/18/2016   Procedure: TOTAL KNEE ARTHROPLASTY;  Surgeon: Hessie Knows, MD;  Location: [facility];  Service:  Orthopedics;  Laterality: Left;  . TRANSURETHRAL RESECTION OF BLADDER TUMOR N/A 09/19/2016   Procedure: TRANSURETHRAL RESECTION OF BLADDER TUMOR (TURBT) MEDIUM;  Surgeon: Nickie Retort, MD;  Location: [facility];  Service: Urology;  Laterality: N/A;    Home Medications:  Allergies as of 04/20/2020   No Known Allergies     Medication List       Accurate as of April 20, 2020 11:20 AM. If you have any questions, ask your nurse or doctor.        acetaminophen 650 MG CR tablet Commonly known as: TYLENOL Take 1,300 mg by mouth daily as needed for pain.   carbidopa-levodopa 25-100 MG tablet Commonly known as: SINEMET IR Take 1 tablet by mouth 3 (three) times daily.   gabapentin 100 MG capsule Commonly known as: NEURONTIN Take 200 mg by mouth 2 (two) times daily.   oxybutynin 10 MG 24 hr tablet Commonly known as: DITROPAN-XL Take 1 tablet (10 mg total) by mouth daily. What changed: when to take this   SALONPAS EX Apply 1 application topically 4 (four) times daily as needed (pain.).   vitamin B-12 1000 MCG tablet Commonly known as: CYANOCOBALAMIN Take 1,000 mcg by mouth daily.   VITAMIN D PO Take 1 tablet by mouth daily.       Allergies: No Known Allergies  Family History: Family History  Problem Relation Age of Onset  . Stroke Father   .  Diabetes Sister   . Breast cancer Sister   . Cancer Maternal Aunt   . Hematuria Neg Hx   . Renal cancer Neg Hx   . Prostate cancer Neg Hx   . Bladder Cancer Neg Hx     Social History:  reports that he has never smoked. He has never used smokeless tobacco. He reports that he does not drink alcohol and does not use drugs.   Physical Exam: BP 109/68 (BP Location: Left Arm, Patient Position: Sitting, Cuff Size: Large)   Pulse 60   Ht 5\' 9"  (1.753 m)   Wt 193 lb 14.4 oz (88 kg)   BMI 28.63 kg/m   Constitutional:  Alert and oriented, No acute distress. HEENT: [place] AT, moist mucus membranes.  Trachea midline, no  masses. Cardiovascular: No clubbing, cyanosis, or edema. Respiratory: Normal respiratory effort, no increased work of breathing. GI: Abdomen is soft, nontender, nondistended, no abdominal masses GU: No CVA tenderness Skin: No rashes, bruises or suspicious lesions. Neurologic: Grossly intact, no focal deficits, moving all 4 extremities. Psychiatric: Normal mood and affect.  Laboratory Data: Lab Results  Component Value Date   WBC 4.3 09/12/2016   HGB 13.4 09/12/2016   HCT 42.5 09/12/2016   MCV 73.1 (L) 09/12/2016   PLT 207 09/12/2016    Lab Results  Component Value Date   CREATININE 0.90 07/21/2018    No results found for: PSA  No results found for: TESTOSTERONE  No results found for: HGBA1C  Urinalysis    Component Value Date/Time   COLORURINE STRAW (A) 03/05/2016 1417   APPEARANCEUR Clear 10/27/2018 1352   LABSPEC 1.003 (L) 03/05/2016 1417   PHURINE 6.0 03/05/2016 1417   GLUCOSEU Negative 10/27/2018 1352   HGBUR 2+ (A) 03/05/2016 1417   BILIRUBINUR Negative 10/27/2018 [place] 03/05/2016 1417   PROTEINUR Negative 10/27/2018 1352   PROTEINUR NEGATIVE 03/05/2016 1417   NITRITE Negative 10/27/2018 1352   NITRITE NEGATIVE 03/05/2016 1417   LEUKOCYTESUR Negative 10/27/2018 1352    Lab Results  Component Value Date   LABMICR See below: 10/27/2018   WBCUA 0-5 10/27/2018   RBCUA None seen 03/12/2018   LABEPIT None seen 10/27/2018   MUCUS Present (A) 10/27/2018   BACTERIA None seen 10/27/2018    Pertinent Imaging: N/a  No results found for this or any previous visit.  No results found for this or any previous visit.  No results found for this or any previous visit.  No results found for this or any previous visit.  No results found for this or any previous visit.  No results found for this or any previous visit.  No results found for this or any previous visit.  No results found for this or any previous visit.   Assessment & Plan:     1) frequency, urgency and UUI - his UA is clear. Disc nature r/b of anticholinergics and beta-ags. Will try Gemtesa. 4 weeks of samples given.   2) h/o bladder ca  - we went over his stage, grade and prognosis. I recommended continued cystoscopic surveillance and we went over the nature r/b/a to cystoscopy. He will f/u for cysto.   No follow-ups on file.  Festus Aloe, MD  [facility] [address], [place], [place] [address] [phone]

## 2020-04-20 NOTE — Patient Instructions (Signed)

## 2020-04-23 LAB — URINALYSIS, COMPLETE
Bilirubin, UA: NEGATIVE
Glucose, UA: NEGATIVE
Ketones, UA: NEGATIVE
Leukocytes,UA: NEGATIVE
Nitrite, UA: NEGATIVE
Protein,UA: NEGATIVE
RBC, UA: NEGATIVE
Specific Gravity, UA: 1.025 (ref 1.005–1.030)
Urobilinogen, Ur: 1 mg/dL (ref 0.2–1.0)
pH, UA: 5 (ref 5.0–7.5)

## 2020-04-23 LAB — MICROSCOPIC EXAMINATION
Bacteria, UA: NONE SEEN
Epithelial Cells (non renal): NONE SEEN /hpf (ref 0–10)
RBC, Urine: NONE SEEN /hpf (ref 0–2)
WBC, UA: NONE SEEN /hpf (ref 0–5)

## 2020-05-18 ENCOUNTER — Ambulatory Visit: Payer: Medicare Other | Admitting: Urology

## 2020-07-30 ENCOUNTER — Telehealth: Payer: Self-pay | Admitting: Urology

## 2020-07-30 NOTE — Telephone Encounter (Signed)
Pt would like to get a RX for [employer] [place] gave him 4 weeks worth of samples and he would like to continue taking them. Thanks.

## 2020-07-31 ENCOUNTER — Other Ambulatory Visit: Payer: Self-pay

## 2020-07-31 DIAGNOSIS — R350 Frequency of micturition: Secondary | ICD-10-CM

## 2020-07-31 MED ORDER — GEMTESA 75 MG PO TABS: 1.0000 | ORAL_TABLET | Freq: Every day | ORAL | 11 refills | Status: DC

## 2020-07-31 NOTE — Telephone Encounter (Signed)
Patient called with no answer. Message left that prescription had been sent in.

## 2020-07-31 NOTE — Telephone Encounter (Signed)
Attempted to call patient.  No answer.

## 2020-07-31 NOTE — Telephone Encounter (Signed)
1 month supply of Gemtesa samples left at the front desk for the patient to pick up until we can get a prescription sent to the patient's pharmacy.

## 2020-11-27 ENCOUNTER — Other Ambulatory Visit: Payer: Self-pay | Admitting: Physician Assistant

## 2020-11-27 DIAGNOSIS — M5412 Radiculopathy, cervical region: Secondary | ICD-10-CM

## 2020-12-13 ENCOUNTER — Other Ambulatory Visit: Payer: Self-pay

## 2020-12-13 ENCOUNTER — Ambulatory Visit
Admission: RE | Admit: 2020-12-13 | Discharge: 2020-12-13 | Disposition: A | Discharge status: Home / Self Care | Payer: Medicare Other | Source: Ambulatory Visit | Attending: Physician Assistant | Admitting: Physician Assistant

## 2020-12-13 DIAGNOSIS — M5412 Radiculopathy, cervical region: Secondary | ICD-10-CM | POA: Diagnosis not present

## 2021-07-15 ENCOUNTER — Ambulatory Visit: Payer: Medicare Other | Admitting: Urology

## 2021-07-15 ENCOUNTER — Other Ambulatory Visit: Payer: Self-pay

## 2021-07-15 ENCOUNTER — Encounter: Payer: Self-pay | Admitting: Urology

## 2021-07-15 VITALS — BP 137/65 | HR 67

## 2021-07-15 DIAGNOSIS — Z8551 Personal history of malignant neoplasm of bladder: Secondary | ICD-10-CM | POA: Diagnosis not present

## 2021-07-15 DIAGNOSIS — N401 Enlarged prostate with lower urinary tract symptoms: Secondary | ICD-10-CM | POA: Diagnosis not present

## 2021-07-15 DIAGNOSIS — R311 Benign essential microscopic hematuria: Secondary | ICD-10-CM

## 2021-07-15 DIAGNOSIS — R350 Frequency of micturition: Secondary | ICD-10-CM | POA: Diagnosis not present

## 2021-07-15 DIAGNOSIS — N138 Other obstructive and reflux uropathy: Secondary | ICD-10-CM

## 2021-07-15 LAB — BLADDER SCAN AMB NON-IMAGING: Scan Result: 1

## 2021-07-15 MED ORDER — GEMTESA 75 MG PO TABS: 1.0000 | ORAL_TABLET | Freq: Every day | ORAL | 11 refills | Status: DC

## 2021-07-15 MED ORDER — FINASTERIDE 5 MG PO TABS: 5.0000 mg | ORAL_TABLET | Freq: Every day | ORAL | 3 refills | Status: DC

## 2021-07-15 NOTE — Progress Notes (Signed)
? ?07/15/2021 ?1:35 PM  ? ?[name] ?1932-04-22 ?[id number] ? ?Referring provider: Kirk Ruths, MD ?[place],  [place] [address] ? ?No chief complaint on file. ? ? ?HPI: ? ?F/u -  ?  ?1) h/o High risk bladder ca - TURBT 09/2016 with Dr. Pilar Jarvis for large >6cm HG Ta UCC right bladder tumor. His last CT was 07/21/2018. He was treated with BCG induction in 2018 and maintenance through 2019 and declined further BCG (in part due to [place] in 2020). His last CT was 07/21/2018 and his last cystoscopy was 10/27/2018. Pt did not f/u and declines further cystoscopy.  ? ?I recommended cystoscopy again in 2021 and pt did not follow-up. Here with his daughter.  ? ?No gross hematuria.  ? ?  ?2) BPH - CT scan in 2013 with prostate enlargement noted (~100g). He had frequency and urgency and was started on oxybutynin in 2019. Symptoms returned when he ran out of oxybutynin. He has urgency and occ UUI. He does have some confusion and constipation. No gross hematuria. [place] working well. His stream is good. Noc x 1. Daytime frequency and urgency.  ? ? ?Today, seen for the above.  ? ? ?PMH: ?Past Medical History:  ?Diagnosis Date  ? Arthritis   ? Cancer [facility])   ? BLADDER  ? Complication of anesthesia   ? had a hard time recovering from anesthesia after knee replacement neuro symptoms  ? Hyperlipidemia   ? Hypertension   ? ? ?Surgical History: ?Past Surgical History:  ?Procedure Laterality Date  ? eye implants  1992  ? EYE SURGERY Bilateral   ? Cataract Extraction with IOL Implants  ? HERNIA REPAIR Right 1992  ? Inguinal Hernia Repair  ? INGUINAL HERNIA REPAIR Left 01/03/2016  ? Procedure: LAPAROSCOPIC INGUINAL HERNIA;  Surgeon: Jules Husbands, MD;  Location: [facility];  Service: General;  Laterality: Left;  ? INGUINAL HERNIA REPAIR Left 06/01/2019  ? Procedure: HERNIA REPAIR INGUINAL ADULT;  Surgeon: Herbert Pun, MD;  Location: [facility];  Service: General;  Laterality: Left;  ?  JOINT REPLACEMENT    ? TOTAL KNEE ARTHROPLASTY Left 03/18/2016  ? Procedure: TOTAL KNEE ARTHROPLASTY;  Surgeon: Hessie Knows, MD;  Location: [facility];  Service: Orthopedics;  Laterality: Left;  ? TRANSURETHRAL RESECTION OF BLADDER TUMOR N/A 09/19/2016  ? Procedure: TRANSURETHRAL RESECTION OF BLADDER TUMOR (TURBT) MEDIUM;  Surgeon: Nickie Retort, MD;  Location: [facility];  Service: Urology;  Laterality: N/A;  ? ? ?Home Medications:  ?Allergies as of 07/15/2021   ?No Known Allergies ?  ? ?  ?Medication List  ?  ? ?  ? Accurate as of July 15, 2021  1:35 PM. If you have any questions, ask your nurse or doctor.  ?  ?  ? ?  ? ?acetaminophen 650 MG CR tablet ?Commonly known as: TYLENOL ?Take 1,300 mg by mouth daily as needed for pain. ?  ?carbidopa-levodopa 25-100 MG tablet ?Commonly known as: SINEMET IR ?Take 1 tablet by mouth 3 (three) times daily. ?  ?gabapentin 100 MG capsule ?Commonly known as: NEURONTIN ?Take 200 mg by mouth 2 (two) times daily. ?  ?Gemtesa 75 MG Tabs ?Generic drug: Vibegron ?Take 1 tablet by mouth daily. ?  ?oxybutynin 10 MG 24 hr tablet ?Commonly known as: DITROPAN-XL ?Take 1 tablet (10 mg total) by mouth daily. ?  ?pantoprazole 40 MG tablet ?Commonly known as: PROTONIX ?Take 40 mg by mouth daily. ?  ?pantoprazole  40 MG tablet ?Commonly known as: PROTONIX ?Take by mouth. ?  ?PROBIOTIC ACIDOPHILUS PO ?Take by mouth. ?  ?SALONPAS EX ?Apply 1 application topically 4 (four) times daily as needed (pain.). ?  ?vitamin B-12 1000 MCG tablet ?Commonly known as: CYANOCOBALAMIN ?Take 1,000 mcg by mouth daily. ?  ?VITAMIN D PO ?Take 1 tablet by mouth daily. ?  ? ?  ? ? ?Allergies: No Known Allergies ? ?Family History: ?Family History  ?Problem Relation Age of Onset  ? Stroke Father   ? Diabetes Sister   ? Breast cancer Sister   ? Cancer Maternal Aunt   ? Hematuria Neg Hx   ? Renal cancer Neg Hx   ? Prostate cancer Neg Hx   ? Bladder Cancer Neg Hx   ? ? ?Social History:  reports that he has never smoked. He  has never used smokeless tobacco. He reports that he does not drink alcohol and does not use drugs. ? ? ?Physical Exam: ?BP 137/65   Pulse 67   ?Constitutional:  Alert and oriented, No acute distress. ?HEENT: [place] AT, moist mucus membranes.  Trachea midline, no masses. ?Cardiovascular: No clubbing, cyanosis, or edema. ?Respiratory: Normal respiratory effort, no increased work of breathing. ?GI: Abdomen is soft, nontender, nondistended, no abdominal masses ?GU: No CVA tenderness ?Lymph: No cervical or inguinal lymphadenopathy. ?Skin: No rashes, bruises or suspicious lesions. ?Neurologic: Grossly intact, no focal deficits, moving all 4 extremities. ?Psychiatric: Normal mood and affect. ? ?Laboratory Data: ?Lab Results  ?Component Value Date  ? WBC 4.3 09/12/2016  ? HGB 13.4 09/12/2016  ? HCT 42.5 09/12/2016  ? MCV 73.1 (L) 09/12/2016  ? PLT 207 09/12/2016  ? ? ?Lab Results  ?Component Value Date  ? CREATININE 0.90 07/21/2018  ? ? ?No results found for: PSA ? ?No results found for: TESTOSTERONE ? ?No results found for: HGBA1C ? ?Urinalysis ?   ?Component Value Date/Time  ? COLORURINE STRAW (A) 03/05/2016 1417  ? APPEARANCEUR Clear 04/20/2020 1146  ? LABSPEC 1.003 (L) 03/05/2016 1417  ? PHURINE 6.0 03/05/2016 1417  ? GLUCOSEU Negative 04/20/2020 1146  ? HGBUR 2+ (A) 03/05/2016 1417  ? BILIRUBINUR Negative 04/20/2020 1146  ? [place] NEGATIVE 03/05/2016 1417  ? PROTEINUR Negative 04/20/2020 1146  ? PROTEINUR NEGATIVE 03/05/2016 1417  ? NITRITE Negative 04/20/2020 1146  ? NITRITE NEGATIVE 03/05/2016 1417  ? LEUKOCYTESUR Negative 04/20/2020 1146  ? ? ?Lab Results  ?Component Value Date  ? LABMICR See below: 04/20/2020  ? [place] None seen 04/20/2020  ? RBCUA None seen 03/12/2018  ? LABEPIT None seen 04/20/2020  ? MUCUS Present (A) 10/27/2018  ? BACTERIA None seen 04/20/2020  ? ? ? ? ?Assessment & Plan:   ? ?1. Urinary frequency ?Gemtesa - take QOD (expensive and long half life) ?- Urinalysis, Routine w reflex microscopic ?-  BLADDER SCAN AMB NON-IMAGING ? ?2. BPH - disc FDA warnings on 5ari and he will start.  ? ?3. MH - disc nature r/b/a to CT and cysto with patient and daughter and he elects to proceed.  ? ?No follow-ups on file. ? ?Festus Aloe, MD ? ?[facility]  ?[place], [place] [address] ?[phone] ? ? ?

## 2021-07-15 NOTE — Progress Notes (Signed)
post void residual=1 

## 2021-07-17 LAB — URINALYSIS, ROUTINE W REFLEX MICROSCOPIC
Bilirubin, UA: NEGATIVE
Glucose, UA: NEGATIVE
Leukocytes,UA: NEGATIVE
Nitrite, UA: NEGATIVE
Specific Gravity, UA: >1.03 — ABNORMAL HIGH (ref 1.005–1.030)
Urobilinogen, Ur: 0.2 mg/dL (ref 0.2–1.0)
pH, UA: 5.5 (ref 5.0–7.5)

## 2021-07-17 LAB — MICROSCOPIC EXAMINATION
Epithelial Cells (non renal): NONE SEEN /hpf (ref 0–10)
Renal Epithel, UA: NONE SEEN /hpf
WBC, UA: NONE SEEN /hpf (ref 0–5)

## 2021-09-02 ENCOUNTER — Ambulatory Visit (HOSPITAL_COMMUNITY)
Admission: RE | Admit: 2021-09-02 | Discharge: 2021-09-02 | Disposition: A | Discharge status: Home / Self Care | Payer: Medicare Other | Source: Ambulatory Visit | Attending: Urology | Admitting: Urology

## 2021-09-02 DIAGNOSIS — R311 Benign essential microscopic hematuria: Secondary | ICD-10-CM | POA: Diagnosis present

## 2021-09-02 DIAGNOSIS — Z8551 Personal history of malignant neoplasm of bladder: Secondary | ICD-10-CM | POA: Insufficient documentation

## 2021-09-09 ENCOUNTER — Encounter: Payer: Self-pay | Admitting: Urology

## 2021-09-09 ENCOUNTER — Ambulatory Visit (INDEPENDENT_AMBULATORY_CARE_PROVIDER_SITE_OTHER): Payer: Medicare Other | Admitting: Urology

## 2021-09-09 VITALS — BP 144/81 | HR 50

## 2021-09-09 DIAGNOSIS — N401 Enlarged prostate with lower urinary tract symptoms: Secondary | ICD-10-CM | POA: Diagnosis not present

## 2021-09-09 DIAGNOSIS — Z8551 Personal history of malignant neoplasm of bladder: Secondary | ICD-10-CM

## 2021-09-09 DIAGNOSIS — R350 Frequency of micturition: Secondary | ICD-10-CM | POA: Diagnosis not present

## 2021-09-09 LAB — MICROSCOPIC EXAMINATION
Bacteria, UA: NONE SEEN
Renal Epithel, UA: NONE SEEN /hpf
WBC, UA: NONE SEEN /hpf (ref 0–5)

## 2021-09-09 LAB — URINALYSIS, ROUTINE W REFLEX MICROSCOPIC
Bilirubin, UA: NEGATIVE
Glucose, UA: NEGATIVE
Leukocytes,UA: NEGATIVE
Nitrite, UA: NEGATIVE
Protein,UA: NEGATIVE
Specific Gravity, UA: 1.025 (ref 1.005–1.030)
Urobilinogen, Ur: 2 mg/dL — ABNORMAL HIGH (ref 0.2–1.0)
pH, UA: 5.5 (ref 5.0–7.5)

## 2021-09-09 MED ORDER — CIPROFLOXACIN HCL 500 MG PO TABS
500.0000 mg | ORAL_TABLET | Freq: Once | ORAL | Status: AC
  Administered 2021-09-09: 500 mg via ORAL

## 2021-09-09 NOTE — Progress Notes (Signed)
? ?  09/09/21 ? ?CC: No chief complaint on file. ? ? ?HPI: ?F/u -  ?  ?1) h/o High risk bladder ca - TURBT 09/2016 with Dr. Pilar Jarvis for large >6cm HG Ta UCC right bladder tumor.  ? ?Imaging/staging: ?-Mar 2020 CT a/p - benign  ?-Apr 2023 CT a/p - benign, BPH  ? ?Intravesical tx: ?-2018 BCG induction x 6 ?-2019 BCG maintenance - declined further BCG (in part due to [place] in 2020).  ? ?  ?2) BPH - CT scan in 2013 with prostate enlargement noted (~100g). He had frequency and urgency and was started on oxybutynin in 2019. Symptoms returned when he ran out of oxybutynin. He has urgency and occ UUI. He does have some confusion and constipation. No gross hematuria. [place] working well. His stream is good. Noc x 1. Daytime frequency and urgency.  ? ? ?He returns in management of the above and for cystoscopy. He started finasteride Mar 2023 for BPH and LUTS. Voiding adequately. CT Apr 2023 images reviewed and benign.  ?  ?  ?Today, seen for the above.  ?There were no vitals taken for this visit. ?NED. A&Ox3.   ?No respiratory distress   ?Abd soft, NT, ND ?Normal phallus with bilateral descended testicles ? ?Cystoscopy Procedure Note ? ?Patient identification was confirmed, informed consent was obtained, and patient was prepped using Betadine solution.  Lidocaine jelly was administered per urethral meatus.   ? ? ?Pre-Procedure: ?- Inspection reveals a normal caliber ureteral meatus. ? ?Procedure: ?The flexible cystoscope was introduced without difficulty ?- No urethral strictures/lesions are present. ?- prostate with lateral lobe hypertrophy and obstruction with about a cm intravesical extension ?- normal bladder neck ?- Bilateral ureteral orifices identified ?- Bladder mucosa  reveals no ulcers, tumors, or lesions ?- No bladder stones ?- mild trabeculation ? ?Retroflexion shows normal BN and about 1 cm extension of the prostate  ? ? ?Post-Procedure: ?- Patient tolerated the procedure well ? ?Assessment/ Plan: ? ?1) h/o bladder  cancer - benign evaluation  ? ?2) BPH with LUTS - continue 5ari  ? ?No follow-ups on file. ? ?Festus Aloe, MD ? ?

## 2022-02-28 ENCOUNTER — Telehealth: Payer: Self-pay

## 2022-02-28 NOTE — Telephone Encounter (Signed)
Patient's daughter calling to ask if  Vibegron (GEMTESA) 49 MG TABS Can be increased due to incontinence.  Has made an upcoming appt on Thurs. 26th.  Thanks, Helene Kelp

## 2022-03-06 ENCOUNTER — Ambulatory Visit: Payer: Medicare Other | Admitting: Physician Assistant

## 2022-03-06 VITALS — BP 145/83 | HR 54

## 2022-03-06 DIAGNOSIS — N401 Enlarged prostate with lower urinary tract symptoms: Secondary | ICD-10-CM | POA: Diagnosis not present

## 2022-03-06 DIAGNOSIS — R350 Frequency of micturition: Secondary | ICD-10-CM | POA: Diagnosis not present

## 2022-03-06 DIAGNOSIS — N3941 Urge incontinence: Secondary | ICD-10-CM

## 2022-03-06 LAB — BLADDER SCAN AMB NON-IMAGING: Scan Result: 2

## 2022-03-06 NOTE — Progress Notes (Signed)
Assessment: 1. Benign prostatic hyperplasia with urinary frequency - Urinalysis, Routine w reflex microscopic - BLADDER SCAN AMB NON-IMAGING  2. Urge incontinence of urine    Plan: Discussed ways to avoid delays in voiding by using urinal where he is when the urge to void starts, use his bedside toilet if he feels he has time and avoiding PO fluid intake prior to bedtime. Safety concerns discussed as well. Pt's wife agrees to assist with this. Keep FU as scheduled with Dr. Junious Silk.     Chief Complaint: No chief complaint on file.   HPI: Travis [name] is a 88 y.o. male with a history of bladder CA and BPH with urgency and frequency, and UUI who presents for evaluation of incontinence at night due to inability to mobilize to his bathroom in time to void. He has to void once at night. Pt's tremors and instability with ambulation progressing per pt's wife which they feel is part of the reason he is unable to make it to the restroom. He has a walker for assist. [name] has been helpful in the past. Pt on finasteride.   UA=clear PVR=79m IPSS= 16   QOL=4  09/09/21 F/u -    1) h/o High risk bladder ca - TURBT 09/2016 with Dr. BPilar Jarvisfor large >6cm HG Ta UCC right bladder tumor.    Imaging/staging: -Mar 2020 CT a/p - benign  -Apr 2023 CT a/p - benign, BPH    Intravesical tx: -2018 BCG induction x 6 -2019 BCG maintenance - declined further BCG (in part [place]      2) BPH - CT scan in 2013 with prostate enlargement noted (~100g). He had frequency and urgency and was started on oxybutynin in 2019. Symptoms returned when he ran out of oxybutynin. He has urgency and occ UUI. He does have some confusion and constipation. No gross hematuria. [place] well. His stream is good. Noc x 1. Daytime frequency and urgency.      He returns in management of the above and for cystoscopy. He started finasteride Mar 2023 for BPH and LUTS. Voiding adequately. CT Apr 2023 images  reviewed and benign.   Portions of the above documentation were copied from a prior visit for review purposes only.  Allergies: No Known Allergies  PMH: Past Medical History:  Diagnosis Date   Arthritis    Cancer ([place]    BLADDER   Complication of anesthesia    had a hard time recovering from anesthesia after knee replacement neuro symptoms   Hyperlipidemia    Hypertension     PSH: Past Surgical History:  Procedure Laterality Date   eye implants  1992   EYE SURGERY Bilateral    Cataract Extraction with IOL Implants   HERNIA REPAIR Right 1992   Inguinal Hernia Repair   INGUINAL HERNIA REPAIR Left 01/03/2016   Procedure: LAPAROSCOPIC INGUINAL HERNIA;  Surgeon: DJules Husbands MD;  Location: [facility];  Service: General;  Laterality: Left;   INGUINAL HERNIA REPAIR Left 06/01/2019   Procedure: HERNIA REPAIR INGUINAL ADULT;  Surgeon: CHerbert Pun MD;  Location: [facility];  Service: General;  Laterality: Left;   JOINT REPLACEMENT     TOTAL KNEE ARTHROPLASTY Left 03/18/2016   Procedure: TOTAL KNEE ARTHROPLASTY;  Surgeon: MHessie Knows MD;  Location: [facility];  Service: Orthopedics;  Laterality: Left;   TRANSURETHRAL RESECTION OF BLADDER TUMOR N/A 09/19/2016   Procedure: TRANSURETHRAL RESECTION OF BLADDER TUMOR (TURBT) MEDIUM;  Surgeon: BNickie Retort MD;  Location: [facility];  Service: Urology;  Laterality: N/A;    SH: Social History   Tobacco Use   Smoking status: Never   Smokeless tobacco: Never  Vaping Use   Vaping Use: Never used  Substance Use Topics   Alcohol use: No   Drug use: No    ROS: All other review of systems were reviewed and are negative except what is noted above in HPI  PE: There were no vitals taken for this visit. GENERAL APPEARANCE:  Well appearing, well developed, well nourished, NAD HEENT:  Atraumatic, normocephalic NECK:  Supple. Trachea midline ABDOMEN:  Soft, non-tender, no masses EXTREMITIES:  Moves all extremities well, without  clubbing, cyanosis, or edema NEUROLOGIC:  Alert and oriented x 3, normal gait, CN II-XII grossly intact MENTAL STATUS:  appropriate BACK:  Non-tender to palpation, No CVAT SKIN:  Warm, dry, and intact   Results: Laboratory Data: Lab Results  Component Value Date   WBC 4.3 09/12/2016   HGB 13.4 09/12/2016   HCT 42.5 09/12/2016   MCV 73.1 (L) 09/12/2016   PLT 207 09/12/2016    Lab Results  Component Value Date   CREATININE 0.90 07/21/2018    Urinalysis    Component Value Date/Time   COLORURINE STRAW (A) 03/05/2016 1417   APPEARANCEUR Clear 09/09/2021 1122   LABSPEC 1.003 (L) 03/05/2016 1417   PHURINE 6.0 03/05/2016 1417   GLUCOSEU Negative 09/09/2021 1122   HGBUR 2+ (A) 03/05/2016 1417   BILIRUBINUR Negative 09/09/2021 [place] 03/05/2016 1417   PROTEINUR Negative 09/09/2021 1122   PROTEINUR NEGATIVE 03/05/2016 1417   NITRITE Negative 09/09/2021 1122   NITRITE NEGATIVE 03/05/2016 1417   LEUKOCYTESUR Negative 09/09/2021 1122    Lab Results  Component Value Date   LABMICR See below: 09/09/2021   WBCUA None seen 09/09/2021   RBCUA None seen 03/12/2018   LABEPIT 0-10 09/09/2021   MUCUS Present 07/15/2021   BACTERIA None seen 09/09/2021    Pertinent Imaging: No results found for this or any previous visit.  No results found for this or any previous visit.  No results found for this or any previous visit.  No results found for this or any previous visit.  No results found for this or any previous visit.  No valid procedures specified. No results found for this or any previous visit.  No results found for this or any previous visit.  No results found for this or any previous visit (from the past 24 hour(s)).

## 2022-03-07 LAB — MICROSCOPIC EXAMINATION: Bacteria, UA: NONE SEEN

## 2022-03-07 LAB — URINALYSIS, ROUTINE W REFLEX MICROSCOPIC
Bilirubin, UA: NEGATIVE
Glucose, UA: NEGATIVE
Ketones, UA: NEGATIVE
Leukocytes,UA: NEGATIVE
Nitrite, UA: NEGATIVE
Specific Gravity, UA: 1.03 (ref 1.005–1.030)
Urobilinogen, Ur: 1 mg/dL (ref 0.2–1.0)
pH, UA: 5.5 (ref 5.0–7.5)

## 2022-03-12 ENCOUNTER — Encounter: Payer: Self-pay | Admitting: Physician Assistant

## 2022-07-14 ENCOUNTER — Telehealth: Payer: Self-pay

## 2022-07-14 ENCOUNTER — Other Ambulatory Visit: Payer: Self-pay

## 2022-07-14 MED ORDER — FINASTERIDE 5 MG PO TABS: 5.0000 mg | ORAL_TABLET | Freq: Every day | ORAL | 1 refills | Status: DC

## 2022-07-14 NOTE — Telephone Encounter (Signed)
See other telephone note.  

## 2022-07-14 NOTE — Telephone Encounter (Signed)
Patient called and advised they needed a refill on medication below.   Medication: finasteride (PROSCAR) 5 MG tablet    Pharmacy: [facility] [address], [place], [place] [address]  Thank you

## 2022-07-14 NOTE — Telephone Encounter (Signed)
Pharmacy requested refill of Finasteride 5 mg.  Dr. Junious Silk gave verbal to send in 90 day rx with 1 refill and scheduled a 1 year f/u in May.  Apt scheduled, reminder letter mailed and refill sent to pharmacy.  I left a voicemail requesting a call back to confirm apt.

## 2022-09-03 ENCOUNTER — Telehealth: Payer: Self-pay

## 2022-09-03 DIAGNOSIS — R350 Frequency of micturition: Secondary | ICD-10-CM

## 2022-09-03 MED ORDER — GEMTESA 75 MG PO TABS: 1.0000 | ORAL_TABLET | Freq: Every day | ORAL | 2 refills | Status: DC

## 2022-09-03 NOTE — Telephone Encounter (Signed)
Return called to patient. Left detailed message making patient aware that Rx has been sent to pharmacy for refill.

## 2022-09-03 NOTE — Telephone Encounter (Signed)
Patient needing refill on:   Vibegron [facility]) 75 MG TABS   Pharmacy:  [facility] [address], [place], [place] [address]

## 2022-09-29 ENCOUNTER — Encounter: Payer: Self-pay | Admitting: Urology

## 2022-09-29 ENCOUNTER — Ambulatory Visit: Payer: Medicare Other | Admitting: Urology

## 2022-09-29 VITALS — BP 125/77 | HR 53

## 2022-09-29 DIAGNOSIS — R350 Frequency of micturition: Secondary | ICD-10-CM

## 2022-09-29 DIAGNOSIS — Z8551 Personal history of malignant neoplasm of bladder: Secondary | ICD-10-CM

## 2022-09-29 MED ORDER — FINASTERIDE 5 MG PO TABS: 5.0000 mg | ORAL_TABLET | Freq: Every day | ORAL | 3 refills | Status: DC

## 2022-09-29 MED ORDER — GEMTESA 75 MG PO TABS: 1.0000 | ORAL_TABLET | Freq: Every day | ORAL | 11 refills | Status: DC

## 2022-09-29 NOTE — Progress Notes (Signed)
09/29/2022 2:57 PM   Travis [name] [birth year] [id number]  Referring provider: Lauro Regulus, MD [address] [facility] [place],  [place] [address]  No chief complaint on file.   HPI:  F/u -    1) h/o High risk bladder ca - TURBT 09/2016 with Dr. Sherryl Barters for large >6cm HG Ta UCC right bladder tumor.    Imaging/staging: -Mar 2020 CT a/p - benign  -Apr 2023 CT a/p - benign, BPH    Intravesical tx: -2018 BCG induction x 6 -2019 BCG maintenance - declined further BCG (in part due to COVID in 2020).      2) BPH - CT scan in 2013 with prostate enlargement noted (~100g). He had frequency and urgency and was started on oxybutynin in 2019. Symptoms returned when he ran out of oxybutynin. He has urgency and occ UUI. He does have some confusion and constipation. No gross hematuria. Gemtesa working well. His stream is good. Noc x 1. Daytime frequency and urgency.    He started finasteride Mar 2023 for BPH and LUTS.  Today, seen for the above. Inc freq/urge in Oct 2023. Rx for Fisher given. He is taking it. Noc x 1- 2 -3-4. He uses a urinal. They tried to go QOD but nocturia and frequency worse. No gross hematuria.   PMH: Past Medical History:  Diagnosis Date   Arthritis    Cancer (HCC)    BLADDER   Complication of anesthesia    had a hard time recovering from anesthesia after knee replacement neuro symptoms   Hyperlipidemia    Hypertension     Surgical History: Past Surgical History:  Procedure Laterality Date   eye implants  1992   EYE SURGERY Bilateral    Cataract Extraction with IOL Implants   HERNIA REPAIR Right 1992   Inguinal Hernia Repair   INGUINAL HERNIA REPAIR Left 01/03/2016   Procedure: LAPAROSCOPIC INGUINAL HERNIA;  Surgeon: Leafy Ro, MD;  Location: [facility] ORS;  Service: General;  Laterality: Left;   INGUINAL HERNIA REPAIR Left 06/01/2019   Procedure: HERNIA REPAIR INGUINAL ADULT;  Surgeon: Carolan Shiver, MD;  Location:  [facility] ORS;  Service: General;  Laterality: Left;   JOINT REPLACEMENT     TOTAL KNEE ARTHROPLASTY Left 03/18/2016   Procedure: TOTAL KNEE ARTHROPLASTY;  Surgeon: Kennedy Bucker, MD;  Location: [facility] ORS;  Service: Orthopedics;  Laterality: Left;   TRANSURETHRAL RESECTION OF BLADDER TUMOR N/A 09/19/2016   Procedure: TRANSURETHRAL RESECTION OF BLADDER TUMOR (TURBT) MEDIUM;  Surgeon: Hildred Laser, MD;  Location: [facility] ORS;  Service: Urology;  Laterality: N/A;    Home Medications:  Allergies as of 09/29/2022   No Known Allergies      Medication List        Accurate as of Sep 29, 2022  2:57 PM. If you have any questions, ask your nurse or doctor.          acetaminophen 650 MG CR tablet Commonly known as: TYLENOL Take 1,300 mg by mouth daily as needed for pain.   carbidopa-levodopa 25-100 MG tablet Commonly known as: SINEMET IR Take 1 tablet by mouth 3 (three) times daily.   cyanocobalamin 1000 MCG tablet Commonly known as: VITAMIN B12 Take 1,000 mcg by mouth daily.   finasteride 5 MG tablet Commonly known as: PROSCAR Take 1 tablet (5 mg total) by mouth daily.   gabapentin 100 MG capsule Commonly known as: NEURONTIN Take 200 mg by mouth 2 (two) times daily.  Gemtesa 75 MG Tabs Generic drug: Vibegron Take 1 tablet (75 mg total) by mouth daily.   pantoprazole 40 MG tablet Commonly known as: PROTONIX Take 40 mg by mouth daily.   pantoprazole 40 MG tablet Commonly known as: PROTONIX Take by mouth.   SALONPAS EX Apply 1 application  topically 4 (four) times daily as needed (pain.).   VITAMIN D PO Take 1 tablet by mouth daily.        Allergies: No Known Allergies  Family History: Family History  Problem Relation Age of Onset   Stroke Father    Diabetes Sister    Breast cancer Sister    Cancer Maternal Aunt    Hematuria Neg Hx    Renal cancer Neg Hx    Prostate cancer Neg Hx    Bladder Cancer Neg Hx     Social History:  reports that he has never  smoked. He has never used smokeless tobacco. He reports that he does not drink alcohol and does not use drugs.   Physical Exam: There were no vitals taken for this visit.  Constitutional:  Alert and oriented, No acute distress. HEENT: [place] AT, moist mucus membranes.  Trachea midline, no masses. Cardiovascular: No clubbing, cyanosis, or edema. Respiratory: Normal respiratory effort, no increased work of breathing. GI: Abdomen is soft, nontender, nondistended, no abdominal masses GU: No CVA tenderness Skin: No rashes, bruises or suspicious lesions. Neurologic: Grossly intact, no focal deficits, moving all 4 extremities. Psychiatric: Normal mood and affect.  Laboratory Data: Lab Results  Component Value Date   WBC 4.3 09/12/2016   HGB 13.4 09/12/2016   HCT 42.5 09/12/2016   MCV 73.1 (L) 09/12/2016   PLT 207 09/12/2016    Lab Results  Component Value Date   CREATININE 0.90 07/21/2018    No results found for: "PSA"  No results found for: "TESTOSTERONE"  No results found for: "HGBA1C"  Urinalysis    Component Value Date/Time   COLORURINE STRAW (A) 03/05/2016 1417   APPEARANCEUR Hazy (A) 03/06/2022 1537   LABSPEC 1.003 (L) 03/05/2016 1417   PHURINE 6.0 03/05/2016 1417   GLUCOSEU Negative 03/06/2022 1537   HGBUR 2+ (A) 03/05/2016 1417   BILIRUBINUR Negative 03/06/2022 1537   KETONESUR NEGATIVE 03/05/2016 1417   PROTEINUR 1+ (A) 03/06/2022 1537   PROTEINUR NEGATIVE 03/05/2016 1417   NITRITE Negative 03/06/2022 1537   NITRITE NEGATIVE 03/05/2016 1417   LEUKOCYTESUR Negative 03/06/2022 1537    Lab Results  Component Value Date   LABMICR See below: 03/06/2022   WBCUA 0-5 03/06/2022   RBCUA None seen 03/12/2018   LABEPIT 0-10 03/06/2022   MUCUS Present (A) 03/06/2022   BACTERIA None seen 03/06/2022    Pertinent Imaging: N/a  Assessment & Plan:    1. Urinary frequency Cont gemtesa and finasteride. Refilled.  - Urinalysis, Routine w reflex microscopic  2. H/o  bladder cancer - f/u for cystoscopy . Discussed with pt and family.   No follow-ups on file.  Jerilee Field, MD  [facility]  [address] [place], [place] [address] [phone]

## 2022-09-30 LAB — URINALYSIS, ROUTINE W REFLEX MICROSCOPIC
Bilirubin, UA: NEGATIVE
Glucose, UA: NEGATIVE
Ketones, UA: NEGATIVE
Leukocytes,UA: NEGATIVE
Nitrite, UA: NEGATIVE
Protein,UA: NEGATIVE
Specific Gravity, UA: 1.025 (ref 1.005–1.030)
Urobilinogen, Ur: 2 mg/dL — ABNORMAL HIGH (ref 0.2–1.0)
pH, UA: 5.5 (ref 5.0–7.5)

## 2022-09-30 LAB — MICROSCOPIC EXAMINATION: Bacteria, UA: NONE SEEN

## 2022-10-16 ENCOUNTER — Telehealth: Payer: Self-pay

## 2022-10-16 NOTE — Telephone Encounter (Signed)
Return call to patient's daughter regarding a request for urine specimen. Patient's daughter states that she contact patient's PCP and was told that there are companies that are doing fraudulently claims and requesting patient's insurance to pay for frequently claims. Patient's daughter states she was told to bring a copy of the email to the patient's primary care physician so that the patient's PCP can file a claim on the patient behaved through his insurance company. Patient's daughter is aware that a request for urine specimen was not order from from our office.  Patient's daughter voiced understanding

## 2022-10-16 NOTE — Telephone Encounter (Signed)
Patients daughter called advising they received a request for a urine specimen from the company listed below. She wanted to know if this was something ordered by Dr. Mena Goes. She advised she would also check with his PCP to see if they ordered it.   Minuteful kidney test  [employer] [place]

## 2022-10-20 ENCOUNTER — Emergency Department (HOSPITAL_COMMUNITY): Payer: Medicare Other

## 2022-10-20 ENCOUNTER — Encounter (HOSPITAL_COMMUNITY): Payer: Self-pay | Admitting: Emergency Medicine

## 2022-10-20 ENCOUNTER — Inpatient Hospital Stay (HOSPITAL_COMMUNITY)
Admission: EM | Admit: 2022-10-20 | Discharge: 2022-10-23 | DRG: 871 | Disposition: A | Discharge status: Home Health | Payer: Medicare Other | Attending: Internal Medicine | Admitting: Internal Medicine

## 2022-10-20 ENCOUNTER — Other Ambulatory Visit: Payer: Self-pay

## 2022-10-20 DIAGNOSIS — N3001 Acute cystitis with hematuria: Secondary | ICD-10-CM | POA: Diagnosis present

## 2022-10-20 DIAGNOSIS — B9620 Unspecified Escherichia coli [E. coli] as the cause of diseases classified elsewhere: Secondary | ICD-10-CM | POA: Diagnosis present

## 2022-10-20 DIAGNOSIS — I10 Essential (primary) hypertension: Secondary | ICD-10-CM

## 2022-10-20 DIAGNOSIS — Z961 Presence of intraocular lens: Secondary | ICD-10-CM | POA: Diagnosis present

## 2022-10-20 DIAGNOSIS — U071 COVID-19: Secondary | ICD-10-CM | POA: Diagnosis present

## 2022-10-20 DIAGNOSIS — Z96652 Presence of left artificial knee joint: Secondary | ICD-10-CM | POA: Diagnosis present

## 2022-10-20 DIAGNOSIS — Z9842 Cataract extraction status, left eye: Secondary | ICD-10-CM

## 2022-10-20 DIAGNOSIS — G3184 Mild cognitive impairment, so stated: Secondary | ICD-10-CM | POA: Diagnosis present

## 2022-10-20 DIAGNOSIS — Z9841 Cataract extraction status, right eye: Secondary | ICD-10-CM | POA: Diagnosis not present

## 2022-10-20 DIAGNOSIS — A419 Sepsis, unspecified organism: Secondary | ICD-10-CM | POA: Diagnosis present

## 2022-10-20 DIAGNOSIS — R310 Gross hematuria: Secondary | ICD-10-CM

## 2022-10-20 DIAGNOSIS — N138 Other obstructive and reflux uropathy: Secondary | ICD-10-CM

## 2022-10-20 DIAGNOSIS — R630 Anorexia: Secondary | ICD-10-CM | POA: Diagnosis present

## 2022-10-20 DIAGNOSIS — G20A1 Parkinson's disease without dyskinesia, without mention of fluctuations: Secondary | ICD-10-CM | POA: Diagnosis present

## 2022-10-20 DIAGNOSIS — N390 Urinary tract infection, site not specified: Secondary | ICD-10-CM | POA: Diagnosis not present

## 2022-10-20 DIAGNOSIS — Z6828 Body mass index (BMI) 28.0-28.9, adult: Secondary | ICD-10-CM | POA: Diagnosis not present

## 2022-10-20 DIAGNOSIS — N401 Enlarged prostate with lower urinary tract symptoms: Secondary | ICD-10-CM | POA: Diagnosis present

## 2022-10-20 DIAGNOSIS — M1990 Unspecified osteoarthritis, unspecified site: Secondary | ICD-10-CM | POA: Diagnosis present

## 2022-10-20 DIAGNOSIS — E785 Hyperlipidemia, unspecified: Secondary | ICD-10-CM | POA: Diagnosis present

## 2022-10-20 DIAGNOSIS — I82890 Acute embolism and thrombosis of other specified veins: Secondary | ICD-10-CM | POA: Diagnosis present

## 2022-10-20 DIAGNOSIS — Z79899 Other long term (current) drug therapy: Secondary | ICD-10-CM | POA: Diagnosis not present

## 2022-10-20 DIAGNOSIS — F0280 Dementia in other diseases classified elsewhere without behavioral disturbance: Secondary | ICD-10-CM | POA: Diagnosis present

## 2022-10-20 DIAGNOSIS — R319 Hematuria, unspecified: Secondary | ICD-10-CM | POA: Diagnosis present

## 2022-10-20 DIAGNOSIS — R531 Weakness: Secondary | ICD-10-CM | POA: Diagnosis present

## 2022-10-20 DIAGNOSIS — K5900 Constipation, unspecified: Secondary | ICD-10-CM | POA: Diagnosis present

## 2022-10-20 DIAGNOSIS — Z8551 Personal history of malignant neoplasm of bladder: Secondary | ICD-10-CM

## 2022-10-20 DIAGNOSIS — C679 Malignant neoplasm of bladder, unspecified: Secondary | ICD-10-CM

## 2022-10-20 HISTORY — DX: Unspecified dementia, unspecified severity, without behavioral disturbance, psychotic disturbance, mood disturbance, and anxiety: F03.90

## 2022-10-20 HISTORY — DX: Parkinson's disease without dyskinesia, without mention of fluctuations: G20.A1

## 2022-10-20 LAB — CBC WITH DIFFERENTIAL/PLATELET
Abs Immature Granulocytes: 0.02 10*3/uL (ref 0.00–0.07)
Basophils Absolute: 0 10*3/uL (ref 0.0–0.1)
Basophils Relative: 0 %
Eosinophils Absolute: 0 10*3/uL (ref 0.0–0.5)
Eosinophils Relative: 0 %
HCT: 36.1 % — ABNORMAL LOW (ref 39.0–52.0)
Hemoglobin: 11.2 g/dL — ABNORMAL LOW (ref 13.0–17.0)
Immature Granulocytes: 0 %
Lymphocytes Relative: 6 %
Lymphs Abs: 0.5 10*3/uL — ABNORMAL LOW (ref 0.7–4.0)
MCH: 25 pg — ABNORMAL LOW (ref 26.0–34.0)
MCHC: 31 g/dL (ref 30.0–36.0)
MCV: 80.6 fL (ref 80.0–100.0)
Monocytes Absolute: 1 10*3/uL (ref 0.1–1.0)
Monocytes Relative: 12 %
Neutro Abs: 7 10*3/uL (ref 1.7–7.7)
Neutrophils Relative %: 82 %
Platelets: 176 10*3/uL (ref 150–400)
RBC: 4.48 MIL/uL (ref 4.22–5.81)
RDW: 15.4 % (ref 11.5–15.5)
WBC: 8.6 10*3/uL (ref 4.0–10.5)
nRBC: 0 % (ref 0.0–0.2)

## 2022-10-20 LAB — COMPREHENSIVE METABOLIC PANEL
ALT: 12 U/L (ref 0–44)
AST: 13 U/L — ABNORMAL LOW (ref 15–41)
Albumin: 3.4 g/dL — ABNORMAL LOW (ref 3.5–5.0)
Alkaline Phosphatase: 76 U/L (ref 38–126)
Anion gap: 8 (ref 5–15)
BUN: 23 mg/dL (ref 8–23)
CO2: 26 mmol/L (ref 22–32)
Calcium: 8.6 mg/dL — ABNORMAL LOW (ref 8.9–10.3)
Chloride: 103 mmol/L (ref 98–111)
Creatinine, Ser: 1.03 mg/dL (ref 0.61–1.24)
GFR, Estimated: >60 mL/min (ref >60)
Glucose, Bld: 117 mg/dL — ABNORMAL HIGH (ref 70–99)
Potassium: 3.7 mmol/L (ref 3.5–5.1)
Sodium: 137 mmol/L (ref 135–145)
Total Bilirubin: 0.7 mg/dL (ref 0.3–1.2)
Total Protein: 6.4 g/dL — ABNORMAL LOW (ref 6.5–8.1)

## 2022-10-20 LAB — RESP PANEL BY RT-PCR (RSV, FLU A&B, COVID)  RVPGX2
Influenza A by PCR: NEGATIVE
Influenza B by PCR: NEGATIVE
Resp Syncytial Virus by PCR: NEGATIVE
SARS Coronavirus 2 by RT PCR: POSITIVE — AB

## 2022-10-20 LAB — URINALYSIS, ROUTINE W REFLEX MICROSCOPIC
Bilirubin Urine: NEGATIVE
Glucose, UA: NEGATIVE mg/dL
Ketones, ur: NEGATIVE mg/dL
Nitrite: NEGATIVE
Protein, ur: 30 mg/dL — AB
Specific Gravity, Urine: 1.014 (ref 1.005–1.030)
WBC, UA: >50 WBC/hpf (ref 0–5)
pH: 5 (ref 5.0–8.0)

## 2022-10-20 LAB — PROTIME-INR
INR: 1.2 (ref 0.8–1.2)
Prothrombin Time: 15 seconds (ref 11.4–15.2)

## 2022-10-20 LAB — LACTIC ACID, PLASMA
Lactic Acid, Venous: 1 mmol/L (ref 0.5–1.9)
Lactic Acid, Venous: 0.9 mmol/L (ref 0.5–1.9)

## 2022-10-20 LAB — APTT: aPTT: 28 seconds (ref 24–36)

## 2022-10-20 MED ORDER — GABAPENTIN 100 MG PO CAPS
200.0000 mg | ORAL_CAPSULE | Freq: Two times a day (BID) | ORAL | Status: DC
  Filled 2022-10-20: qty 2

## 2022-10-20 MED ORDER — PANTOPRAZOLE SODIUM 40 MG PO TBEC
40.0000 mg | DELAYED_RELEASE_TABLET | Freq: Every day | ORAL | Status: DC
  Administered 2022-10-21 – 2022-10-23 (×3): 40 mg via ORAL
  Filled 2022-10-20 (×3): qty 1

## 2022-10-20 MED ORDER — ONDANSETRON HCL 4 MG PO TABS: 4.0000 mg | ORAL_TABLET | Freq: Four times a day (QID) | ORAL | Status: DC | PRN

## 2022-10-20 MED ORDER — FINASTERIDE 5 MG PO TABS
5.0000 mg | ORAL_TABLET | Freq: Every day | ORAL | Status: DC
  Administered 2022-10-21 – 2022-10-23 (×3): 5 mg via ORAL
  Filled 2022-10-20 (×3): qty 1

## 2022-10-20 MED ORDER — SODIUM CHLORIDE 0.9 % IV SOLN
2.0000 g | Freq: Once | INTRAVENOUS | Status: AC
  Administered 2022-10-20: 2 g via INTRAVENOUS
  Filled 2022-10-20: qty 20

## 2022-10-20 MED ORDER — SODIUM CHLORIDE 0.9 % IV BOLUS
500.0000 mL | Freq: Once | INTRAVENOUS | Status: AC
  Administered 2022-10-20: 500 mL via INTRAVENOUS

## 2022-10-20 MED ORDER — IOHEXOL 300 MG/ML  SOLN
100.0000 mL | Freq: Once | INTRAMUSCULAR | Status: AC | PRN
  Administered 2022-10-20: 100 mL via INTRAVENOUS

## 2022-10-20 MED ORDER — LORAZEPAM 2 MG/ML IJ SOLN
0.5000 mg | Freq: Once | INTRAMUSCULAR | Status: AC
  Administered 2022-10-20: 0.5 mg via INTRAVENOUS
  Filled 2022-10-20: qty 1

## 2022-10-20 MED ORDER — MIRABEGRON ER 25 MG PO TB24
25.0000 mg | ORAL_TABLET | Freq: Every day | ORAL | Status: DC
  Administered 2022-10-21 – 2022-10-23 (×3): 25 mg via ORAL
  Filled 2022-10-20 (×3): qty 1

## 2022-10-20 MED ORDER — ACETAMINOPHEN 500 MG PO TABS
1000.0000 mg | ORAL_TABLET | Freq: Once | ORAL | Status: AC
  Administered 2022-10-20: 1000 mg via ORAL
  Filled 2022-10-20: qty 2

## 2022-10-20 MED ORDER — SODIUM CHLORIDE 0.9 % IV BOLUS
2000.0000 mL | Freq: Once | INTRAVENOUS | Status: AC
  Administered 2022-10-20: 2000 mL via INTRAVENOUS

## 2022-10-20 MED ORDER — ONDANSETRON HCL 4 MG/2ML IJ SOLN: 4.0000 mg | Freq: Four times a day (QID) | INTRAMUSCULAR | Status: DC | PRN

## 2022-10-20 MED ORDER — ACETAMINOPHEN 325 MG PO TABS
650.0000 mg | ORAL_TABLET | Freq: Four times a day (QID) | ORAL | Status: DC | PRN
  Administered 2022-10-22: 650 mg via ORAL
  Filled 2022-10-20: qty 2

## 2022-10-20 MED ORDER — ACETAMINOPHEN 650 MG RE SUPP: 650.0000 mg | Freq: Four times a day (QID) | RECTAL | Status: DC | PRN

## 2022-10-20 MED ORDER — ACETAMINOPHEN 325 MG PO TABS
650.0000 mg | ORAL_TABLET | Freq: Once | ORAL | Status: AC
  Administered 2022-10-20: 650 mg via ORAL
  Filled 2022-10-20: qty 2

## 2022-10-20 MED ORDER — SODIUM CHLORIDE 0.9 % IV SOLN: INTRAVENOUS | Status: AC

## 2022-10-20 MED ORDER — SODIUM CHLORIDE 0.9 % IV SOLN: INTRAVENOUS | Status: DC

## 2022-10-20 MED ORDER — SODIUM CHLORIDE 0.9 % IV SOLN
2.0000 g | Freq: Once | INTRAVENOUS | Status: AC
  Administered 2022-10-21: 2 g via INTRAVENOUS
  Filled 2022-10-20: qty 20

## 2022-10-20 MED ORDER — POLYETHYLENE GLYCOL 3350 17 G PO PACK
17.0000 g | PACK | Freq: Every day | ORAL | Status: DC | PRN
  Administered 2022-10-21: 17 g via ORAL
  Filled 2022-10-20: qty 1

## 2022-10-20 NOTE — Assessment & Plan Note (Signed)
Resume finasteride, Travis [name]

## 2022-10-20 NOTE — Sepsis Progress Note (Signed)
Elink monitoring for the code sepsis protocol.  

## 2022-10-20 NOTE — ED Triage Notes (Signed)
Pt became very weak while being outside. EMS arrived and pt's temp was 101.9 F. Ems administered of LR. Pt has dementia

## 2022-10-20 NOTE — Assessment & Plan Note (Addendum)
Daughter reports blood in urine with blood clots.  Be secondary to UTI.  History of bladder cancer, BPH.  Hgb at baseline. -Resume finasteride, Gemtesa. - Repeat CBC in a.m. -If recurrent may need urology eval

## 2022-10-20 NOTE — Assessment & Plan Note (Addendum)
Meeting sepsis criteria with fever of 101.2, tachypnea respirate rate 18-27.  WBC 8.6.  UA suggestive of UTI with moderate leukocytes many bacteria.  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 -IV ceftriaxone 2 g daily -F/u Blood and urine cultures - EDP to Dr. Robyne Peers- doubts presence of free air -Now total of 2.5 L Bolus given, continue N/s 75cc/hr x 15hrs -CT also incidentally shows a mild amount of thrombus in splenic vein, will hold off on anticoagulation for now, curbside consult with oncology- question significance.  No History of liver disease, and imaging does not  suggest liver disease.

## 2022-10-20 NOTE — ED Provider Notes (Signed)
[name] Provider Note   CSN: [id number] Arrival date & time: 10/20/22  1454     History {Add pertinent medical, surgical, social history, OB history to HPI:1} Chief Complaint  Patient presents with   Heat Exposure    Travis [name] is a 89 y.o. male.  Patient with a history of dementia.  He had some fevers and chills today.   Fever      Home Medications Prior to Admission medications   Medication Sig Start Date End Date Taking? Authorizing Provider  acetaminophen (TYLENOL) 650 MG CR tablet Take 1,300 mg by mouth daily as needed for pain.    [provider]  cetirizine (ZYRTEC) 10 MG tablet Take by mouth.    [provider]  finasteride (PROSCAR) 5 MG tablet Take 1 tablet (5 mg total) by mouth daily. 09/29/22   Jerilee Field, MD  gabapentin (NEURONTIN) 100 MG capsule Take 200 mg by mouth 2 (two) times daily. 03/26/20   [provider]  pantoprazole (PROTONIX) 40 MG tablet Take 40 mg by mouth daily. 05/15/21   [provider]  pantoprazole (PROTONIX) 40 MG tablet Take by mouth. 05/15/21 05/15/22  [provider]  Vibegron (GEMTESA) 75 MG TABS Take 1 tablet (75 mg total) by mouth daily. 09/29/22   Jerilee Field, MD  vitamin B-12 (CYANOCOBALAMIN) 1000 MCG tablet Take 1,000 mcg by mouth daily.     [provider]  VITAMIN D PO Take 1 tablet by mouth daily.    [provider]      Allergies    Patient has no known allergies.    Review of Systems   Review of Systems  Constitutional:  Positive for fever.    Physical Exam Updated Vital Signs BP 115/73   Pulse (!) 58   Temp (!) 101.2 F (38.4 C) (Rectal)   Resp (!) 23   Ht 5\' 9"  (1.753 m)   Wt 88 kg   SpO2 96%   BMI 28.65 kg/m  Physical Exam  ED Results / Procedures / Treatments   Labs (all labs ordered are listed, but only abnormal results are displayed) Labs Reviewed  RESP PANEL BY RT-PCR (RSV, FLU  A&B, COVID)  RVPGX2 - Abnormal; Notable for the following components:      Result Value   SARS Coronavirus 2 by RT PCR POSITIVE (*)    All other components within normal limits  COMPREHENSIVE METABOLIC PANEL - Abnormal; Notable for the following components:   Glucose, Bld 117 (*)    Calcium 8.6 (*)    Total Protein 6.4 (*)    Albumin 3.4 (*)    AST 13 (*)    All other components within normal limits  CBC WITH DIFFERENTIAL/PLATELET - Abnormal; Notable for the following components:   Hemoglobin 11.2 (*)    HCT 36.1 (*)    MCH 25.0 (*)    Lymphs Abs 0.5 (*)    All other components within normal limits  URINALYSIS, ROUTINE W REFLEX MICROSCOPIC - Abnormal; Notable for the following components:   APPearance HAZY (*)    Hgb urine dipstick MODERATE (*)    Protein, ur 30 (*)    Leukocytes,Ua MODERATE (*)    Bacteria, UA MANY (*)    All other components within normal limits  CULTURE, BLOOD (ROUTINE X 2)  CULTURE, BLOOD (ROUTINE X 2)  URINE CULTURE  LACTIC ACID, PLASMA  PROTIME-INR  APTT  LACTIC ACID, PLASMA    EKG  EKG Interpretation  Date/Time:  Monday October 20 2022 16:00:37 EDT Ventricular Rate:  75 PR Interval:  155 QRS Duration: 93 QT Interval:  345 QTC Calculation: 386 R Axis:   -30 Text Interpretation: Sinus rhythm Left axis deviation Confirmed by Bethann Berkshire [phone]) on 10/20/2022 6:27:49 PM  Radiology CT ABDOMEN PELVIS W CONTRAST  Result Date: 10/20/2022 CLINICAL DATA:  Abdominal pain. EXAM: CT ABDOMEN AND PELVIS WITH CONTRAST TECHNIQUE: Multidetector CT imaging of the abdomen and pelvis was performed using the standard protocol following bolus administration of intravenous contrast. RADIATION DOSE REDUCTION: This exam was performed according to the departmental dose-optimization program which includes automated exposure control, adjustment of the mA and/or kV according to patient size and/or use of iterative reconstruction technique. CONTRAST:  OMNIPAQUE IOHEXOL  300 MG/ML  SOLN COMPARISON:  None Available. FINDINGS: Lower chest: Moderate severity atelectasis is seen within the posterior aspect of the bilateral lung bases. Hepatobiliary: An 11 mm diameter hepatic cyst versus hemangioma is noted within the right lobe the liver (axial CT image 30, CT series 2). The gallbladder is contracted. No gallstones, gallbladder wall thickening, or biliary dilatation. Pancreas: Unremarkable. No pancreatic ductal dilatation or surrounding inflammatory changes. Spleen: Normal in size without focal abnormality. Adrenals/Urinary Tract: Adrenal glands are unremarkable. Kidneys are normal, without renal calculi, focal lesion, or hydronephrosis. There is moderate severity diffuse urinary bladder wall thickening. Stomach/Bowel: Stomach is within normal limits. Appendix appears normal (axial CT images 55 through 61, CT series 2). A large amount of stool is seen throughout the colon. No evidence of bowel wall thickening, distention, or inflammatory changes. A curvilinear area of air attenuation is seen within the anterior aspect of the right upper quadrant below the anterior aspect of the right hemidiaphragm (axial CT images 14 through 24, CT series 2). Adjacent partially air-filled loops of bowel are also seen. Vascular/Lymphatic: Aortic atherosclerosis. A small amount of intraluminal low attenuation is seen within the proximal aspect of the splenic vein (axial CT images 35 and 36, CT series 2/coronal reformatted images 47 through 53, CT series 6). No enlarged abdominal or pelvic lymph nodes. Reproductive: Moderate to marked severity prostate gland enlargement is seen with mass effect noted on the base of the urinary bladder. Other: No abdominal wall hernia or abnormality. No abdominopelvic ascites. Musculoskeletal: Marked severity multilevel degenerative changes are seen throughout the lumbar spine. IMPRESSION: 1. Area of air attenuation within the anterior aspect of the right upper quadrant, as  described above. While this may represent a partially air-filled loop of bowel, the presence of a small amount of free intraperitoneal air cannot be excluded. Correlation with follow-up abdomen and pelvis CT is recommended. 2. Mild amount of thrombus within the splenic vein. 3. Diffuse urinary bladder wall thickening which may be secondary to chronic bladder outlet obstruction. Correlation with urinalysis is recommended to exclude the presence of a superimposed cystitis. 4. Moderate to marked severity prostatomegaly. Electronically Signed   By: Aram Candela M.D.   On: 10/20/2022 18:18   DG Chest 2 View  Result Date: 10/20/2022 CLINICAL DATA:  Fever. EXAM: CHEST - 2 VIEW COMPARISON:  None Available. FINDINGS: The heart size and mediastinal contours are within normal limits. Low lung volumes are noted. Mild atelectasis is seen within the bilateral lung bases, left greater than right. No pleural effusion or pneumothorax is identified. The visualized skeletal structures are unremarkable. IMPRESSION: Low lung volumes with mild bibasilar atelectasis, left greater than right. Electronically Signed   By: Demetrius Revel.D.  On: 10/20/2022 17:59    Procedures Procedures  {Document cardiac monitor, telemetry assessment procedure when appropriate:1}  Medications Ordered in ED Medications  sodium chloride 0.9 % bolus 2,000 mL (2,000 mLs Intravenous Bolus 10/20/22 1610)  cefTRIAXone (ROCEPHIN) 2 g in sodium chloride 0.9 % 100 mL IVPB (0 g Intravenous Stopped 10/20/22 1719)  acetaminophen (TYLENOL) tablet 650 mg (650 mg Oral Given 10/20/22 1619)  iohexol (OMNIPAQUE) 300 MG/ML solution 100 mL (100 mLs Intravenous Contrast Given 10/20/22 1710)    ED Course/ Medical Decision Making/ A&P   {  CRITICAL CARE Performed by: Bethann Berkshire Total critical care time: 40 minutes Critical care time was exclusive of separately billable procedures and treating other patients. Critical care was necessary to treat or  prevent imminent or life-threatening deterioration. Critical care was time spent personally by me on the following activities: development of treatment plan with patient and/or surrogate as well as nursing, discussions with consultants, evaluation of patient's response to treatment, examination of patient, obtaining history from patient or surrogate, ordering and performing treatments and interventions, ordering and review of laboratory studies, ordering and review of radiographic studies, pulse oximetry and re-evaluation of patient's condition.  Click here for ABCD2, HEART and other calculatorsREFRESH Note before signing :1}                          Medical Decision Making Amount and/or Complexity of Data Reviewed Labs: ordered. Radiology: ordered. ECG/medicine tests: ordered.  Risk OTC drugs. Prescription drug management. Decision regarding hospitalization.   Patient with urosepsis.  Also COVID infection and small splenic vein thrombosis.  He will be admitted to medicine {Document critical care time when appropriate:1} {Document review of labs and clinical decision tools ie heart score, Chads2Vasc2 etc:1}  {Document your independent review of radiology images, and any outside records:1} {Document your discussion with family members, caretakers, and with consultants:1} {Document social determinants of health affecting pt's care:1} {Document your decision making why or why not admission, treatments were needed:1} Final Clinical Impression(s) / ED Diagnoses Final diagnoses:  Acute cystitis with hematuria  COVID-19    Rx / DC Orders ED Discharge Orders     None

## 2022-10-20 NOTE — Assessment & Plan Note (Signed)
Asymptomatic from a COVID standpoint.  Chest x-ray clear.  He is not hypoxic.  No known prior COVID infection.  Received about 3-4 doses of the COVID-vaccine.  - Monitor for now

## 2022-10-20 NOTE — Assessment & Plan Note (Signed)
Blood pressure systolic 102-161.  Not on medication

## 2022-10-20 NOTE — ED Notes (Signed)
Pt c/o of RLQ pain to abd & rectal temp shown to be 101.2. EDP made aware of both.

## 2022-10-20 NOTE — Assessment & Plan Note (Addendum)
Follows with Dr. Mena Goes. h/o High risk bladder ca - TURBT 09/2016. In 2018 2019 he had BCG induction and maintenance. Per last visit 09/2022, patient was to follow-up for cystoscopy for his bladder cancer.

## 2022-10-20 NOTE — H&P (Addendum)
History and Physical    Travis [name] WUJ:[id number] DOB: 1931/08/20 DOA: 10/20/2022  PCP: Lauro Regulus, MD   Patient coming from: Home  I have personally briefly reviewed patient's old medical records in [facility]  Chief Complaint: Weakness  HPI: Travis [name] is a 89 y.o. male with medical history significant for BPH, HTN, Bladder cancer.  Patient presented to the ED with reports of weakness, blood in urine.  Patient is awake alert oriented and able to give a history, daughter is at bedside.  Patient had fever and chills today.  Also reports patient's urinating blood today with clots, and positive history of urinary frequency, but no dysuria.  Has also had right lower quadrant abdominal pain. No cough, No difficulty breathing.  He has never had COVID before.  He received 3-4 doses of COVID-vaccine.  Dementia documented in problem list, reports this is very mild.  At baseline ambulates with a cane.  ED Course: Tmax 101.3.  Heart rate 50s to 70s.  Respiratory rate 18-27.  Blood pressure systolic 102-161.  WBC 8.6.  Lactic acid 1.  COVID test positive.  UA with trace leukocytes and many bacteria.  Chest x-ray clear.  CT abdomen and pelvis-amount of thrombus within the splenic vein, small amount of free intraperitoneal air-can not be excluded.  Also suggest cystitis. .EDP talked to general surgeon Dr. Robyne Peers about CT findings, she doubts presence of free air. Ceftriaxone 2 g given.  2 L bolus given.  Review of Systems: As per HPI all other systems reviewed and negative.  Past Medical History:  Diagnosis Date   Arthritis    Cancer (HCC)    BLADDER   Complication of anesthesia    had a hard time recovering from anesthesia after knee replacement neuro symptoms   Dementia (HCC)    Hyperlipidemia    Hypertension    Parkinson disease     Past Surgical History:  Procedure Laterality Date   eye implants  1992   EYE SURGERY Bilateral    Cataract Extraction with IOL  Implants   HERNIA REPAIR Right 1992   Inguinal Hernia Repair   INGUINAL HERNIA REPAIR Left 01/03/2016   Procedure: LAPAROSCOPIC INGUINAL HERNIA;  Surgeon: Leafy Ro, MD;  Location: [facility] ORS;  Service: General;  Laterality: Left;   INGUINAL HERNIA REPAIR Left 06/01/2019   Procedure: HERNIA REPAIR INGUINAL ADULT;  Surgeon: Carolan Shiver, MD;  Location: [facility] ORS;  Service: General;  Laterality: Left;   JOINT REPLACEMENT     TOTAL KNEE ARTHROPLASTY Left 03/18/2016   Procedure: TOTAL KNEE ARTHROPLASTY;  Surgeon: Kennedy Bucker, MD;  Location: [facility] ORS;  Service: Orthopedics;  Laterality: Left;   TRANSURETHRAL RESECTION OF BLADDER TUMOR N/A 09/19/2016   Procedure: TRANSURETHRAL RESECTION OF BLADDER TUMOR (TURBT) MEDIUM;  Surgeon: Hildred Laser, MD;  Location: [facility] ORS;  Service: Urology;  Laterality: N/A;     reports that he has never smoked. He has never used smokeless tobacco. He reports that he does not drink alcohol and does not use drugs.  No Known Allergies  Family History  Problem Relation Age of Onset   Stroke Father    Diabetes Sister    Breast cancer Sister    Cancer Maternal Aunt    Hematuria Neg Hx    Renal cancer Neg Hx    Prostate cancer Neg Hx    Bladder Cancer Neg Hx    Prior to Admission medications   Medication Sig Start Date End Date Taking? Authorizing  Provider  acetaminophen (TYLENOL) 650 MG CR tablet Take 1,300 mg by mouth daily as needed for pain.    [provider]  cetirizine (ZYRTEC) 10 MG tablet Take by mouth.    [provider]  finasteride (PROSCAR) 5 MG tablet Take 1 tablet (5 mg total) by mouth daily. 09/29/22   Jerilee Field, MD  gabapentin (NEURONTIN) 100 MG capsule Take 200 mg by mouth 2 (two) times daily. 03/26/20   [provider]  pantoprazole (PROTONIX) 40 MG tablet Take 40 mg by mouth daily. 05/15/21   [provider]  pantoprazole (PROTONIX) 40 MG tablet Take by mouth. 05/15/21 05/15/22  [provider]  Vibegron (GEMTESA) 75 MG TABS Take 1 tablet (75 mg total) by mouth daily. 09/29/22   Jerilee Field, MD  vitamin B-12 (CYANOCOBALAMIN) 1000 MCG tablet Take 1,000 mcg by mouth daily.     [provider]  VITAMIN D PO Take 1 tablet by mouth daily.    [provider]    Physical Exam: Vitals:   10/20/22 1630 10/20/22 1700 10/20/22 1730 10/20/22 1800  BP: 108/66 113/64 103/64 105/66  Pulse: 70 62 63 (!) 59  Resp: (!) 24 20 (!) 22 (!) 27  Temp:      TempSrc:      SpO2: 94% 96% 94% 94%  Weight:      Height:        Constitutional: Initially calm, comfortable, later with shaking chills and chills. Vitals:   10/20/22 1630 10/20/22 1700 10/20/22 1730 10/20/22 1800  BP: 108/66 113/64 103/64 105/66  Pulse: 70 62 63 (!) 59  Resp: (!) 24 20 (!) 22 (!) 27  Temp:      TempSrc:      SpO2: 94% 96% 94% 94%  Weight:      Height:       Eyes: PERRL, lids and conjunctivae normal ENMT: Mucous membranes are moist.  Neck: normal, supple, no masses, no thyromegaly Respiratory: clear to auscultation bilaterally, no wheezing, no crackles. Normal respiratory effort. No accessory muscle use.  Cardiovascular: Regular rate and rhythm, no murmurs / rubs / gallops. No extremity edema.  Extremities warm. Abdomen: Mild right lower abdominal tenderness, no masses palpated.  Musculoskeletal: no clubbing / cyanosis. No joint deformity upper and lower extremities. Skin: no rashes, lesions, ulcers. No induration Neurologic: 4+/5 strength in all extremities,no facial asymmetry, speech fluent Psychiatric: Normal judgment and insight. Alert and oriented x 3. Normal mood.   Labs on Admission: I have personally reviewed following labs and imaging studies  CBC: Recent Labs  Lab 10/20/22 1610  WBC 8.6  NEUTROABS 7.0  HGB 11.2*  HCT 36.1*  MCV 80.6  PLT 176   Basic Metabolic Panel: Recent Labs  Lab 10/20/22 1610  NA 137  K 3.7  CL 103  CO2 26  GLUCOSE 117*  BUN 23   CREATININE 1.03  CALCIUM 8.6*   Liver Function Tests: Recent Labs  Lab 10/20/22 1610  AST 13*  ALT 12  ALKPHOS 76  BILITOT 0.7  PROT 6.4*  ALBUMIN 3.4*   Coagulation Profile: Recent Labs  Lab 10/20/22 1610  INR 1.2   Urine analysis:    Component Value Date/Time   COLORURINE YELLOW 10/20/2022 1629   APPEARANCEUR HAZY (A) 10/20/2022 1629   APPEARANCEUR Clear 09/29/2022 1438   LABSPEC 1.014 10/20/2022 1629   PHURINE 5.0 10/20/2022 1629   GLUCOSEU NEGATIVE 10/20/2022 1629   HGBUR MODERATE (A) 10/20/2022 1629   BILIRUBINUR NEGATIVE 10/20/2022 1629  BILIRUBINUR Negative 09/29/2022 1438   KETONESUR NEGATIVE 10/20/2022 1629   PROTEINUR 30 (A) 10/20/2022 1629   NITRITE NEGATIVE 10/20/2022 1629   LEUKOCYTESUR MODERATE (A) 10/20/2022 1629    Radiological Exams on Admission: CT ABDOMEN PELVIS W CONTRAST  Result Date: 10/20/2022 CLINICAL DATA:  Abdominal pain. EXAM: CT ABDOMEN AND PELVIS WITH CONTRAST TECHNIQUE: Multidetector CT imaging of the abdomen and pelvis was performed using the standard protocol following bolus administration of intravenous contrast. RADIATION DOSE REDUCTION: This exam was performed according to the departmental dose-optimization program which includes automated exposure control, adjustment of the mA and/or kV according to patient size and/or use of iterative reconstruction technique. CONTRAST:  OMNIPAQUE IOHEXOL 300 MG/ML  SOLN COMPARISON:  None Available. FINDINGS: Lower chest: Moderate severity atelectasis is seen within the posterior aspect of the bilateral lung bases. Hepatobiliary: An 11 mm diameter hepatic cyst versus hemangioma is noted within the right lobe the liver (axial CT image 30, CT series 2). The gallbladder is contracted. No gallstones, gallbladder wall thickening, or biliary dilatation. Pancreas: Unremarkable. No pancreatic ductal dilatation or surrounding inflammatory changes. Spleen: Normal in size without focal abnormality.  Adrenals/Urinary Tract: Adrenal glands are unremarkable. Kidneys are normal, without renal calculi, focal lesion, or hydronephrosis. There is moderate severity diffuse urinary bladder wall thickening. Stomach/Bowel: Stomach is within normal limits. Appendix appears normal (axial CT images 55 through 61, CT series 2). A large amount of stool is seen throughout the colon. No evidence of bowel wall thickening, distention, or inflammatory changes. A curvilinear area of air attenuation is seen within the anterior aspect of the right upper quadrant below the anterior aspect of the right hemidiaphragm (axial CT images 14 through 24, CT series 2). Adjacent partially air-filled loops of bowel are also seen. Vascular/Lymphatic: Aortic atherosclerosis. A small amount of intraluminal low attenuation is seen within the proximal aspect of the splenic vein (axial CT images 35 and 36, CT series 2/coronal reformatted images 47 through 53, CT series 6). No enlarged abdominal or pelvic lymph nodes. Reproductive: Moderate to marked severity prostate gland enlargement is seen with mass effect noted on the base of the urinary bladder. Other: No abdominal wall hernia or abnormality. No abdominopelvic ascites. Musculoskeletal: Marked severity multilevel degenerative changes are seen throughout the lumbar spine. IMPRESSION: 1. Area of air attenuation within the anterior aspect of the right upper quadrant, as described above. While this may represent a partially air-filled loop of bowel, the presence of a small amount of free intraperitoneal air cannot be excluded. Correlation with follow-up abdomen and pelvis CT is recommended. 2. Mild amount of thrombus within the splenic vein. 3. Diffuse urinary bladder wall thickening which may be secondary to chronic bladder outlet obstruction. Correlation with urinalysis is recommended to exclude the presence of a superimposed cystitis. 4. Moderate to marked severity prostatomegaly. Electronically  Signed   By: Aram Candela M.D.   On: 10/20/2022 18:18   DG Chest 2 View  Result Date: 10/20/2022 CLINICAL DATA:  Fever. EXAM: CHEST - 2 VIEW COMPARISON:  None Available. FINDINGS: The heart size and mediastinal contours are within normal limits. Low lung volumes are noted. Mild atelectasis is seen within the bilateral lung bases, left greater than right. No pleural effusion or pneumothorax is identified. The visualized skeletal structures are unremarkable. IMPRESSION: Low lung volumes with mild bibasilar atelectasis, left greater than right. Electronically Signed   By: Aram Candela M.D.   On: 10/20/2022 17:59    EKG: Independently reviewed.  Sinus rhythm, rate 75,  QTc 386.  No significant change from prior  Assessment/Plan Principal Problem:   Sepsis secondary to UTI [facility]) Active Problems:   Hematuria   COVID-19 virus infection   BPH with obstruction/lower urinary tract symptoms   HTN (hypertension), benign   MCI (mild cognitive impairment)  Assessment and Plan: * Sepsis secondary to UTI [facility]) Meeting sepsis criteria with fever of 101.2, tachypnea respirate rate 18-27.  WBC 8.6.  UA suggestive of UTI with moderate leukocytes many bacteria.  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 -IV ceftriaxone 2 g daily -F/u Blood and urine cultures - EDP to Dr. Robyne Peers- doubts presence of free air -Now total of 2.5 L Bolus given, continue N/s 75cc/hr x 15hrs -CT also incidentally shows a mild amount of thrombus in splenic vein, will hold off on anticoagulation for now, curbside consult with oncology- question significance.  No History of liver disease, and imaging does not  suggest liver disease.  COVID-19 virus infection Asymptomatic from a COVID standpoint.  Chest x-ray clear.  He is not hypoxic.  No known prior COVID infection.  Received about 3-4 doses of the COVID-vaccine.  - Monitor for now  Hematuria Daughter reports blood  in urine with blood clots.  Be secondary to UTI.  History of bladder cancer, BPH.  Hgb at baseline. -Resume finasteride, Gemtesa. - Repeat CBC in a.m. -If recurrent may need urology eval  Bladder cancer [facility]) Follows with Dr. Mena Goes. h/o High risk bladder ca - TURBT 09/2016. In 2018 2019 he had BCG induction and maintenance. Per last visit 09/2022, patient was to follow-up for cystoscopy for his bladder cancer.  HTN (hypertension), benign Blood pressure systolic 102-161.  Not on medication  BPH with obstruction/lower urinary tract symptoms Resume finasteride, Gemtesa   DVT prophylaxis: SCDS Code Status: FULL code-family patient and daughter at bedside Family Communication: Daughter [name]-bedside is Product manager. Disposition Plan: ~/> 2 days Consults called: None  Admission status: Inpt Tele  I certify that at the point of admission it is my clinical judgment that the patient will require inpatient hospital care spanning beyond 2 midnights from the point of admission due to high intensity of service, high risk for further deterioration and high frequency of surveillance required.    Author: Onnie Boer, MD 10/20/2022 9:52 PM  For on call review [email or web address].

## 2022-10-20 NOTE — ED Notes (Signed)
Verbal report given to nurse on accepting unit, patient being transported vis stretcher accompanied by daughter.

## 2022-10-21 ENCOUNTER — Encounter (HOSPITAL_COMMUNITY): Payer: Self-pay | Admitting: Internal Medicine

## 2022-10-21 DIAGNOSIS — R310 Gross hematuria: Secondary | ICD-10-CM | POA: Diagnosis not present

## 2022-10-21 DIAGNOSIS — U071 COVID-19: Secondary | ICD-10-CM | POA: Diagnosis not present

## 2022-10-21 DIAGNOSIS — A419 Sepsis, unspecified organism: Secondary | ICD-10-CM | POA: Diagnosis not present

## 2022-10-21 DIAGNOSIS — I10 Essential (primary) hypertension: Secondary | ICD-10-CM | POA: Diagnosis not present

## 2022-10-21 LAB — CBC
HCT: 32.8 % — ABNORMAL LOW (ref 39.0–52.0)
Hemoglobin: 10.2 g/dL — ABNORMAL LOW (ref 13.0–17.0)
MCH: 25 pg — ABNORMAL LOW (ref 26.0–34.0)
MCHC: 31.1 g/dL (ref 30.0–36.0)
MCV: 80.4 fL (ref 80.0–100.0)
Platelets: 177 10*3/uL (ref 150–400)
RBC: 4.08 MIL/uL — ABNORMAL LOW (ref 4.22–5.81)
RDW: 15.7 % — ABNORMAL HIGH (ref 11.5–15.5)
WBC: 9.1 10*3/uL (ref 4.0–10.5)
nRBC: 0 % (ref 0.0–0.2)

## 2022-10-21 LAB — FERRITIN: Ferritin: 142 ng/mL (ref 24–336)

## 2022-10-21 LAB — BASIC METABOLIC PANEL
Anion gap: 10 (ref 5–15)
BUN: 21 mg/dL (ref 8–23)
CO2: 23 mmol/L (ref 22–32)
Calcium: 8.3 mg/dL — ABNORMAL LOW (ref 8.9–10.3)
Chloride: 107 mmol/L (ref 98–111)
Creatinine, Ser: 0.98 mg/dL (ref 0.61–1.24)
GFR, Estimated: >60 mL/min (ref >60)
Glucose, Bld: 97 mg/dL (ref 70–99)
Potassium: 3.6 mmol/L (ref 3.5–5.1)
Sodium: 140 mmol/L (ref 135–145)

## 2022-10-21 LAB — CULTURE, BLOOD (ROUTINE X 2)

## 2022-10-21 LAB — URINE CULTURE: Culture: >=100000 — AB

## 2022-10-21 MED ORDER — ZINC SULFATE 220 (50 ZN) MG PO CAPS
220.0000 mg | ORAL_CAPSULE | Freq: Every day | ORAL | Status: DC
  Administered 2022-10-21 – 2022-10-23 (×3): 220 mg via ORAL
  Filled 2022-10-21 (×3): qty 1

## 2022-10-21 MED ORDER — VITAMIN C 500 MG PO TABS
500.0000 mg | ORAL_TABLET | Freq: Every day | ORAL | Status: DC
  Administered 2022-10-21 – 2022-10-23 (×3): 500 mg via ORAL
  Filled 2022-10-21 (×3): qty 1

## 2022-10-21 MED ORDER — SODIUM CHLORIDE 0.9 % IV SOLN: INTRAVENOUS | Status: AC

## 2022-10-21 MED ORDER — SODIUM CHLORIDE 0.9 % IV BOLUS
500.0000 mL | Freq: Once | INTRAVENOUS | Status: AC
  Administered 2022-10-21: 500 mL via INTRAVENOUS

## 2022-10-21 NOTE — Progress Notes (Signed)
Progress Note   Patient: Travis [name] GNF:[id number] DOB: Jul 22, 1931 DOA: 10/20/2022     1 DOS: the patient was seen and examined on 10/21/2022   Brief hospital admission narrative course: Travis [name] is a 89 y.o. male with medical history significant for BPH, HTN, Bladder cancer.  Patient presented to the ED with reports of weakness, blood in urine.  Patient is awake alert oriented and able to give a history, daughter is at bedside.  Patient had fever and chills today.  Also reports patient's urinating blood today with clots, and positive history of urinary frequency, but no dysuria.  Has also had right lower quadrant abdominal pain. No cough, No difficulty breathing.  He has never had COVID before.  He received 3-4 doses of COVID-vaccine.  Dementia documented in problem list, reports this is very mild.  At baseline ambulates with a cane.   ED Course: Tmax 101.3.  Heart rate 50s to 70s.  Respiratory rate 18-27.  Blood pressure systolic 102-161.  WBC 8.6.  Lactic acid 1.  COVID test positive.  UA with trace leukocytes and many bacteria.  Chest x-ray clear.  CT abdomen and pelvis-amount of thrombus within the splenic vein, small amount of free intraperitoneal air-can not be excluded.  Also suggest cystitis. .EDP talked to general surgeon Dr. Robyne Peers about CT findings, she doubts presence of free air. Ceftriaxone 2 g given.  2 L bolus given.  Assessment and Plan: * Sepsis secondary to UTI [facility]) -Meeting sepsis criteria with fever of 101.2, tachypnea respirate rate 18-27.  And UA suggestive of UTI. -Continue current IV antibiotics and follow culture result -IV fluid resuscitation and supportive care provided -CT abdomen and pelvis suggesting splenic vein thrombosis, and possible free intraperitoneal air. -EDP discussed case with Dr. Talmadge Chad doubts presence of free air -CT also incidentally shows a mild amount of thrombus in splenic vein, will hold off on anticoagulation for now  secondary to hematuria process and high risk for bleeding. -Continue to follow clinical response.  COVID-19 virus infection -Asymptomatic from a COVID standpoint.   -Chest x-ray clear.  He is not hypoxic.  No known prior COVID infection.  Received about 3-4 doses of the COVID-vaccine in the past.  -Continue supportive care and close monitoring -Vitamin C, zinc and intermittent follow-up of inflammatory markers recommended.    Hematuria -Daughter reports blood in urine with blood clots.  B -Patient with history of bladder cancer/BPH -Hemoglobin stable -Acute triggering process most likely UTI -Continue current antibiotics and follow culture results -Continue to maintain adequate hydration -Repeat CBC to follow hemoglobin trend intermittently -Continue the use of finasteride and Gemtesa. -Outpatient follow-up with urology service recommended.   Bladder cancer [facility]) -Follows with Dr. Mena Goes. h/o High risk bladder ca -  -Status post TURBT 09/2016. In 2018 2019 he had BCG induction and maintenance.  -Per last visit 09/2022, patient was to follow-up for cystoscopy for his bladder cancer. -Continue patient follow-up with urology service.  HTN (hypertension), benign -Blood pressure soft currently -As an outpatient no using any antihypertensive agent -Continue to follow vital signs and support as needed.  BPH with obstruction/lower urinary tract symptoms - Continue finasteride and Gemtesa   Subjective:  Reporting general malaise and anorexia.  Afebrile and no complaining of shortness of breath or chest pain.  Patient reports no nausea or vomiting.  Physical Exam: Vitals:   10/21/22 0332 10/21/22 0500 10/21/22 0736 10/21/22 1355  BP: (!) 88/60 93/63 103/66 (!) 91/59  Pulse: (!) 57  Marland Kitchen)  51 (!) 55  Resp: 18   16  Temp: 98.3 F (36.8 C)  98.7 F (37.1 C) 98.3 F (36.8 C)  TempSrc: Axillary  Oral Oral  SpO2: 97%  99% 99%  Weight:      Height:       General exam: Alert, awake,  oriented x 3; expressing general malaise anorexia. Respiratory system: Good air movement bilaterally; no using accessory muscles and good saturation on room air appreciated. Cardiovascular system: Rate controlled, no rubs, no gallops, no JVD. Gastrointestinal system: Abdomen is nondistended, soft and nontender. No organomegaly or masses felt. Normal bowel sounds heard. Central nervous system: Generalized weakness reported; no focal deficit. Extremities: No cyanosis or clubbing. Skin: No petechiae. Psychiatry: Judgement and insight appear normal.  Flat affect appreciated  Data Reviewed: CBC: WBCs 9.1, hemoglobin 10.2 and platelet count 177 K Basic metabolic panel: Sodium 140, potassium 3.6, chloride 107, bicarb 23, BUN 21, creatinine 0.98 and GFR >60  Family Communication: Daughter at bedside.  Disposition: Status is: Inpatient Remains inpatient appropriate because: Continue IV antibiotics.   Planned Discharge Destination: Home  Time spent: 35 minutes  Author: Vassie Loll, MD 10/21/2022 6:27 PM  For on call review [email or web address].

## 2022-10-21 NOTE — Progress Notes (Addendum)
Transition of Care [facility]) - Inpatient Brief Assessment   Patient Details  Name: Travis [name] MRN: [id number] Date of Birth: Nov 02, 1931  Transition of Care [facility]) CM/SW Contact:    Leitha Bleak, RN Phone Number: 10/21/2022, 11:11 AM   Clinical Narrative:  Patient admitted with sepsis, has bladder cancer. Continuing medical work up. DC planning for 2 days. PT eval pending. TOC following.   Transition of Care Asessment: Insurance and Status: (P) Insurance coverage has been reviewed Patient has primary care physician: (P) Yes Home environment has been reviewed: (P) home with family Prior level of function:: (P) Needs assistance Prior/Current Home Services: (P) No current home services Social Determinants of Health Reivew: (P) SDOH reviewed no interventions necessary Readmission risk has been reviewed: (P) Yes Transition of care needs: (P) no transition of care needs at this time

## 2022-10-22 DIAGNOSIS — A419 Sepsis, unspecified organism: Secondary | ICD-10-CM | POA: Diagnosis not present

## 2022-10-22 DIAGNOSIS — N390 Urinary tract infection, site not specified: Secondary | ICD-10-CM | POA: Diagnosis not present

## 2022-10-22 LAB — CBC
HCT: 34.9 % — ABNORMAL LOW (ref 39.0–52.0)
Hemoglobin: 11 g/dL — ABNORMAL LOW (ref 13.0–17.0)
MCH: 25.1 pg — ABNORMAL LOW (ref 26.0–34.0)
MCHC: 31.5 g/dL (ref 30.0–36.0)
MCV: 79.5 fL — ABNORMAL LOW (ref 80.0–100.0)
Platelets: 190 10*3/uL (ref 150–400)
RBC: 4.39 MIL/uL (ref 4.22–5.81)
RDW: 15.7 % — ABNORMAL HIGH (ref 11.5–15.5)
WBC: 6.1 10*3/uL (ref 4.0–10.5)
nRBC: 0 % (ref 0.0–0.2)

## 2022-10-22 LAB — COMPREHENSIVE METABOLIC PANEL
ALT: 14 U/L (ref 0–44)
AST: 16 U/L (ref 15–41)
Albumin: 2.7 g/dL — ABNORMAL LOW (ref 3.5–5.0)
Alkaline Phosphatase: 69 U/L (ref 38–126)
Anion gap: 9 (ref 5–15)
BUN: 15 mg/dL (ref 8–23)
CO2: 23 mmol/L (ref 22–32)
Calcium: 8.2 mg/dL — ABNORMAL LOW (ref 8.9–10.3)
Chloride: 102 mmol/L (ref 98–111)
Creatinine, Ser: 0.85 mg/dL (ref 0.61–1.24)
GFR, Estimated: >60 mL/min (ref >60)
Glucose, Bld: 92 mg/dL (ref 70–99)
Potassium: 3.5 mmol/L (ref 3.5–5.1)
Sodium: 134 mmol/L — ABNORMAL LOW (ref 135–145)
Total Bilirubin: 0.7 mg/dL (ref 0.3–1.2)
Total Protein: 5.8 g/dL — ABNORMAL LOW (ref 6.5–8.1)

## 2022-10-22 LAB — URINE CULTURE

## 2022-10-22 LAB — MAGNESIUM: Magnesium: 1.7 mg/dL (ref 1.7–2.4)

## 2022-10-22 MED ORDER — SODIUM CHLORIDE 0.9 % IV SOLN
2.0000 g | INTRAVENOUS | Status: DC
  Administered 2022-10-22: 2 g via INTRAVENOUS
  Filled 2022-10-22: qty 20

## 2022-10-22 MED ORDER — SENNOSIDES-DOCUSATE SODIUM 8.6-50 MG PO TABS
1.0000 | ORAL_TABLET | Freq: Two times a day (BID) | ORAL | Status: DC
  Administered 2022-10-22 – 2022-10-23 (×3): 1 via ORAL
  Filled 2022-10-22 (×3): qty 1

## 2022-10-22 MED ORDER — MAGNESIUM SULFATE 2 GM/50ML IV SOLN
2.0000 g | Freq: Once | INTRAVENOUS | Status: AC
  Administered 2022-10-22: 2 g via INTRAVENOUS
  Filled 2022-10-22: qty 50

## 2022-10-22 MED ORDER — POLYETHYLENE GLYCOL 3350 17 G PO PACK
17.0000 g | PACK | Freq: Every day | ORAL | Status: DC
  Administered 2022-10-22 – 2022-10-23 (×2): 17 g via ORAL
  Filled 2022-10-22 (×2): qty 1

## 2022-10-22 NOTE — Plan of Care (Signed)
  Problem: Pain Managment: Goal: General experience of comfort will improve Outcome: Progressing   

## 2022-10-22 NOTE — Progress Notes (Signed)
Patient rested through the night, prn pain meds given this morning for knee pain, patient resting in bed at this time.

## 2022-10-22 NOTE — Progress Notes (Signed)
PROGRESS NOTE    Travis [name]  WUJ:[id number] DOB: Sep 20, 1931 DOA: 10/20/2022 PCP: Lauro Regulus, MD   Brief Narrative: 89 year old with past medical history significant for BPH, hypertension, bladder cancer presents to the ED with weakness, hematuria.  Patient was having fevers and chills noted to have bloody urine and clots.  He has had lower quadrant abdominal pain.  Evaluation in the ED patient was found to have COVID test positive, chest x-ray was clear.  CT abdomen and pelvis showed small amount of thrombus within the splenic vein, small amount of free intraperitoneal air cannot be excluded.  Patient has been evaluated by surgery Dr. Robyne Peers does not think patient has pneumoperitoneum.  Case discussed with Dr. Ellin Saba with oncology, in regards a small amount of thrombus in the splenic vein.  Recommendation is to hold on starting anticoagulation at this time because of  small amount of thrombus.  Patient will need to follow-up with Dr. Ellin Saba in 1 week, he will reevaluate for hematuria and if cleared he will consider anticoagulation.     Assessment & Plan:   Principal Problem:   Sepsis secondary to UTI [facility]) Active Problems:   Hematuria   COVID-19 virus infection   Bladder cancer (HCC)   BPH with obstruction/lower urinary tract symptoms   HTN (hypertension), benign   MCI (mild cognitive impairment)   1-Sepsis secondary to UTI Patient presents with fever, tachypnea, respiration rate 18-27, source of infection UTI Patient with UA with more than 50 white blood cell and hematuria In culture growing E. coli Continue with IV ceftriaxone  2-COVID 19 virus infection: Asymptomatic Chest x-ray negative for pneumonia Continue with supportive care Continue with vitamin C zinc  3-Hematuria Patient has a history of bladder cancer/BPH Could be related to UTI Will need outpatient follow-up with urology Continue with finasteride and Gemtesa  4-Free ain on on CT  scan;  Evaluated by Dr. Robyne Peers, air below the right hemidiaphragm appears to be intraluminal.  Abdomen is benign. No further recommendation per general surgery  5-Mild amount of  thrombus within the splenic vein: Case discussed with Dr. Ellin Saba with oncology, recommendation is to hold on starting anticoagulation due to recent hematuria.  Patient to follow-up with him in the office within a week he will recheck urine and will consider anticoagulation at that time   6-History of bladder cancer: Follow-up with Dr. Mena Goes Status post TURBT 09/2016. In 2018 2019 he had BCG induction and maintenance.  Needs to  follow-up with urology  7--Hypertension: Not on medication as an outpatient 8-Constipation; continue with miralax.  Hypomagnesemia; replete IV>  BPH: Continue with finasteride and Gemtesa   Estimated body mass index is 28.65 kg/m as calculated from the following:   Height as of this encounter: 5\' 9"  (1.753 m).   Weight as of this encounter: 88 kg.   DVT prophylaxis: SCD Code Status: Full code Family Communication:care discussed with son who was at bedside.  Disposition Plan:  Status is: Inpatient Remains inpatient appropriate because: management of UTI    Consultants:  General Sx  Procedures:  None  Antimicrobials:    Subjective: He is alert, denies dyspnea, cough. He had some diarrhea over weekend at home. Then develops constipation. He did have BM yesterday, after we started him on Miralax.  He report improvement of lower quadrant abdominal pain.  Urine is clear, no further blood.   Objective: Vitals:   10/22/22 0042 10/22/22 0338 10/22/22 0811 10/22/22 1305  BP:  131/79 104/68 106/64  Pulse:  63 (!) 59 62  Resp:  16 14 17   Temp: 99.7 F (37.6 C) 99.9 F (37.7 C) 99.1 F (37.3 C) 98.4 F (36.9 C)  TempSrc: Oral Oral Oral Oral  SpO2:  95% 97% 99%  Weight:      Height:        Intake/Output Summary (Last 24 hours) at 10/22/2022 1453 Last data  filed at 10/22/2022 1300 Gross per 24 hour  Intake 1045.43 ml  Output 1250 ml  Net -204.57 ml   Filed Weights   10/20/22 1504  Weight: 88 kg    Examination:  General exam: Appears calm and comfortable  Respiratory system: Clear to auscultation. Respiratory effort normal. Cardiovascular system: S1 & S2 heard, RRR. No JVD, murmurs, rubs, gallops or clicks. No pedal edema. Gastrointestinal system: Abdomen is nondistended, soft and nontender. No organomegaly or masses felt. Normal bowel sounds heard. Central nervous system: Alert and oriented. No focal neurological deficits. Extremities: Symmetric 5 x 5 power. Skin: No rashes, lesions or ulcers Psychiatry: Judgement and insight appear normal. Mood & affect appropriate.     Data Reviewed: I have personally reviewed following labs and imaging studies  CBC: Recent Labs  Lab 10/20/22 1610 10/21/22 0439 10/22/22 0445  WBC 8.6 9.1 6.1  NEUTROABS 7.0  --   --   HGB 11.2* 10.2* 11.0*  HCT 36.1* 32.8* 34.9*  MCV 80.6 80.4 79.5*  PLT 176 177 190   Basic Metabolic Panel: Recent Labs  Lab 10/20/22 1610 10/21/22 0439 10/22/22 0445  NA 137 140 134*  K 3.7 3.6 3.5  CL 103 107 102  CO2 26 23 23   GLUCOSE 117* 97 92  BUN 23 21 15   CREATININE 1.03 0.98 0.85  CALCIUM 8.6* 8.3* 8.2*  MG  --   --  1.7   GFR: Estimated Creatinine Clearance: 64.7 mL/min (by C-G formula based on SCr of 0.85 mg/dL). Liver Function Tests: Recent Labs  Lab 10/20/22 1610 10/22/22 0445  AST 13* 16  ALT 12 14  ALKPHOS 76 69  BILITOT 0.7 0.7  PROT 6.4* 5.8*  ALBUMIN 3.4* 2.7*   No results for input(s): "LIPASE", "AMYLASE" in the last 168 hours. No results for input(s): "AMMONIA" in the last 168 hours. Coagulation Profile: Recent Labs  Lab 10/20/22 1610  INR 1.2   Cardiac Enzymes: No results for input(s): "CKTOTAL", "CKMB", "CKMBINDEX", "TROPONINI" in the last 168 hours. BNP (last 3 results) No results for input(s): "PROBNP" in the last 8760  hours. HbA1C: No results for input(s): "HGBA1C" in the last 72 hours. CBG: No results for input(s): "GLUCAP" in the last 168 hours. Lipid Profile: No results for input(s): "CHOL", "HDL", "LDLCALC", "TRIG", "CHOLHDL", "LDLDIRECT" in the last 72 hours. Thyroid Function Tests: No results for input(s): "TSH", "T4TOTAL", "FREET4", "T3FREE", "THYROIDAB" in the last 72 hours. Anemia Panel: Recent Labs    10/21/22 0439  FERRITIN 142   Sepsis Labs: Recent Labs  Lab 10/20/22 1610 10/20/22 1844  LATICACIDVEN 1.0 0.9    Recent Results (from the past 240 hour(s))  Blood Culture (routine x 2)     Status: None (Preliminary result)   Collection Time: 10/20/22  4:05 PM   Specimen: Site Not Specified; Blood  Result Value Ref Range Status   Specimen Description SITE NOT SPECIFIED  Final   Special Requests   Final    BOTTLES DRAWN AEROBIC AND ANAEROBIC Blood Culture results may not be optimal due to an excessive volume of blood received in culture bottles  Culture   Final    NO GROWTH 2 DAYS Performed at [facility], [address] [place], [place] [address]    Report Status PENDING  Incomplete  Blood Culture (routine x 2)     Status: None (Preliminary result)   Collection Time: 10/20/22  4:10 PM   Specimen: BLOOD RIGHT ARM  Result Value Ref Range Status   Specimen Description BLOOD RIGHT ARM  Final   Special Requests   Final    BOTTLES DRAWN AEROBIC AND ANAEROBIC Blood Culture results may not be optimal due to an excessive volume of blood received in culture bottles   Culture   Final    NO GROWTH 2 DAYS Performed at [facility], [address] [place], [place] [address]    Report Status PENDING  Incomplete  Resp panel by RT-PCR (RSV, Flu A&B, Covid) Anterior Nasal Swab     Status: Abnormal   Collection Time: 10/20/22  4:11 PM   Specimen: Anterior Nasal Swab  Result Value Ref Range Status   SARS Coronavirus 2 by RT PCR POSITIVE (A) NEGATIVE Final    Comment: (NOTE) SARS-CoV-2  target nucleic acids are DETECTED.  The SARS-CoV-2 RNA is generally detectable in upper respiratory specimens during the acute phase of infection. Positive results are indicative of the presence of the identified virus, but do not rule out bacterial infection or co-infection with other pathogens not detected by the test. Clinical correlation with patient history and other diagnostic information is necessary to determine patient infection status. The expected result is Negative.  Fact Sheet for Patients: BloggerCourse.com  Fact Sheet for Healthcare Providers: SeriousBroker.it  This test is not yet approved or cleared by the [place] FDA and  has been authorized for detection and/or diagnosis of SARS-CoV-2 by FDA under an Emergency Use Authorization (EUA).  This EUA will remain in effect (meaning this test can be used) for the duration of  the COVID-19 declaration under Section 564(b)(1) of the A ct, 21 U.S.C. section 360bbb-3(b)(1), unless the authorization is terminated or revoked sooner.     Influenza A by PCR NEGATIVE NEGATIVE Final   Influenza B by PCR NEGATIVE NEGATIVE Final    Comment: (NOTE) The Xpert Xpress SARS-CoV-2/FLU/RSV plus assay is intended as an aid in the diagnosis of influenza from Nasopharyngeal swab specimens and should not be used as a sole basis for treatment. Nasal washings and aspirates are unacceptable for Xpert Xpress SARS-CoV-2/FLU/RSV testing.  Fact Sheet for Patients: BloggerCourse.com  Fact Sheet for Healthcare Providers: SeriousBroker.it  This test is not yet approved or cleared by the [place] FDA and has been authorized for detection and/or diagnosis of SARS-CoV-2 by FDA under an Emergency Use Authorization (EUA). This EUA will remain in effect (meaning this test can be used) for the duration of the COVID-19 declaration under Section  564(b)(1) of the Act, 21 U.S.C. section 360bbb-3(b)(1), unless the authorization is terminated or revoked.     Resp Syncytial Virus by PCR NEGATIVE NEGATIVE Final    Comment: (NOTE) Fact Sheet for Patients: BloggerCourse.com  Fact Sheet for Healthcare Providers: SeriousBroker.it  This test is not yet approved or cleared by the [place] FDA and has been authorized for detection and/or diagnosis of SARS-CoV-2 by FDA under an Emergency Use Authorization (EUA). This EUA will remain in effect (meaning this test can be used) for the duration of the COVID-19 declaration under Section 564(b)(1) of the Act, 21 U.S.C. section 360bbb-3(b)(1), unless the authorization is terminated or revoked.  Performed  at [facility], [address] [place], [place] [address]   Urine Culture (for pregnant, neutropenic or urologic patients or patients with an indwelling urinary catheter)     Status: Abnormal   Collection Time: 10/20/22  4:29 PM   Specimen: Urine, Clean Catch  Result Value Ref Range Status   Specimen Description   Final    URINE, CLEAN CATCH Performed at [facility], [address] [place], [place] [address]    Special Requests   Final    NONE Performed at [facility], [address], [place], [place] [address]    Culture >=100,000 COLONIES/mL ESCHERICHIA COLI (A)  Final   Report Status 10/22/2022 FINAL  Final   Organism ID, Bacteria ESCHERICHIA COLI (A)  Final      Susceptibility   Escherichia coli - MIC*    AMPICILLIN <=2 SENSITIVE Sensitive     CEFAZOLIN <=4 SENSITIVE Sensitive     CEFEPIME <=0.12 SENSITIVE Sensitive     CEFTRIAXONE <=0.25 SENSITIVE Sensitive     CIPROFLOXACIN <=0.25 SENSITIVE Sensitive     GENTAMICIN <=1 SENSITIVE Sensitive     IMIPENEM <=0.25 SENSITIVE Sensitive     NITROFURANTOIN <=16 SENSITIVE Sensitive     TRIMETH/SULFA <=20 SENSITIVE Sensitive     AMPICILLIN/SULBACTAM <=2 SENSITIVE Sensitive      PIP/TAZO <=4 SENSITIVE Sensitive     * >=100,000 COLONIES/mL ESCHERICHIA COLI         Radiology Studies: CT ABDOMEN PELVIS W CONTRAST  Result Date: 10/20/2022 CLINICAL DATA:  Abdominal pain. EXAM: CT ABDOMEN AND PELVIS WITH CONTRAST TECHNIQUE: Multidetector CT imaging of the abdomen and pelvis was performed using the standard protocol following bolus administration of intravenous contrast. RADIATION DOSE REDUCTION: This exam was performed according to the departmental dose-optimization program which includes automated exposure control, adjustment of the mA and/or kV according to patient size and/or use of iterative reconstruction technique. CONTRAST:  OMNIPAQUE IOHEXOL 300 MG/ML  SOLN COMPARISON:  None Available. FINDINGS: Lower chest: Moderate severity atelectasis is seen within the posterior aspect of the bilateral lung bases. Hepatobiliary: An 11 mm diameter hepatic cyst versus hemangioma is noted within the right lobe the liver (axial CT image 30, CT series 2). The gallbladder is contracted. No gallstones, gallbladder wall thickening, or biliary dilatation. Pancreas: Unremarkable. No pancreatic ductal dilatation or surrounding inflammatory changes. Spleen: Normal in size without focal abnormality. Adrenals/Urinary Tract: Adrenal glands are unremarkable. Kidneys are normal, without renal calculi, focal lesion, or hydronephrosis. There is moderate severity diffuse urinary bladder wall thickening. Stomach/Bowel: Stomach is within normal limits. Appendix appears normal (axial CT images 55 through 61, CT series 2). A large amount of stool is seen throughout the colon. No evidence of bowel wall thickening, distention, or inflammatory changes. A curvilinear area of air attenuation is seen within the anterior aspect of the right upper quadrant below the anterior aspect of the right hemidiaphragm (axial CT images 14 through 24, CT series 2). Adjacent partially air-filled loops of bowel are also seen.  Vascular/Lymphatic: Aortic atherosclerosis. A small amount of intraluminal low attenuation is seen within the proximal aspect of the splenic vein (axial CT images 35 and 36, CT series 2/coronal reformatted images 47 through 53, CT series 6). No enlarged abdominal or pelvic lymph nodes. Reproductive: Moderate to marked severity prostate gland enlargement is seen with mass effect noted on the base of the urinary bladder. Other: No abdominal wall hernia or abnormality. No abdominopelvic ascites. Musculoskeletal: Marked severity multilevel degenerative changes are seen throughout the lumbar  spine. IMPRESSION: 1. Area of air attenuation within the anterior aspect of the right upper quadrant, as described above. While this may represent a partially air-filled loop of bowel, the presence of a small amount of free intraperitoneal air cannot be excluded. Correlation with follow-up abdomen and pelvis CT is recommended. 2. Mild amount of thrombus within the splenic vein. 3. Diffuse urinary bladder wall thickening which may be secondary to chronic bladder outlet obstruction. Correlation with urinalysis is recommended to exclude the presence of a superimposed cystitis. 4. Moderate to marked severity prostatomegaly. Electronically Signed   By: Aram Candela M.D.   On: 10/20/2022 18:18   DG Chest 2 View  Result Date: 10/20/2022 CLINICAL DATA:  Fever. EXAM: CHEST - 2 VIEW COMPARISON:  None Available. FINDINGS: The heart size and mediastinal contours are within normal limits. Low lung volumes are noted. Mild atelectasis is seen within the bilateral lung bases, left greater than right. No pleural effusion or pneumothorax is identified. The visualized skeletal structures are unremarkable. IMPRESSION: Low lung volumes with mild bibasilar atelectasis, left greater than right. Electronically Signed   By: Aram Candela M.D.   On: 10/20/2022 17:59        Scheduled Meds:  ascorbic acid  500 mg Oral Daily   finasteride  5  mg Oral Daily   mirabegron ER  25 mg Oral Daily   pantoprazole  40 mg Oral Daily   senna-docusate  1 tablet Oral BID   zinc sulfate  220 mg Oral Daily   Continuous Infusions:  cefTRIAXone (ROCEPHIN)  IV     magnesium sulfate bolus IVPB       LOS: 2 days    Time spent: 35 minutes    Raziah Funnell A Kainon Varady, MD [facility]   If 7PM-7AM, please contact night-coverage [email or web address]  10/22/2022, 2:53 PM

## 2022-10-22 NOTE — Consult Note (Signed)
[facility] Consult  Reason for Consult: ?pneumoperitoneum on CT scan Referring Physician: Dr. Sunnie Nielsen  Chief Complaint   Heat Exposure     HPI: Travis [name] is a 89 y.o. male who presented to the hospital with generalized weakness and blood in his hearing.  He has a past medical history significant for BPH, hypertension, and bladder cancer.  He was admitted with concern for sepsis secondary to a UTI.  While in the emergency department, he underwent a CT of the abdomen and pelvis which demonstrated an area of air attenuation in the right upper quadrant that could represent a partially air-filled loop of bowel versus free intraperitoneal air.  General surgery was consulted to evaluate the patient for this.  He denies any abdominal pain at this time.  He is tolerating a diet without nausea and vomiting.  He had a bowel movement yesterday, though he has been a little constipated recently.  Since being in the hospital, he has been hemodynamically stable.  He has no leukocytosis.  His surgical history is significant for left inguinal hernia repairs, and transurethral resection of bladder tumor.  Past Medical History:  Diagnosis Date   Arthritis    Cancer (HCC)    BLADDER   Complication of anesthesia    had a hard time recovering from anesthesia after knee replacement neuro symptoms   Dementia (HCC)    Hyperlipidemia    Hypertension    Parkinson disease     Past Surgical History:  Procedure Laterality Date   eye implants  1992   EYE SURGERY Bilateral    Cataract Extraction with IOL Implants   HERNIA REPAIR Right 1992   Inguinal Hernia Repair   INGUINAL HERNIA REPAIR Left 01/03/2016   Procedure: LAPAROSCOPIC INGUINAL HERNIA;  Surgeon: Leafy Ro, MD;  Location: [facility] ORS;  Service: General;  Laterality: Left;   INGUINAL HERNIA REPAIR Left 06/01/2019   Procedure: HERNIA REPAIR INGUINAL ADULT;  Surgeon: Carolan Shiver, MD;  Location: [facility] ORS;  Service:  General;  Laterality: Left;   JOINT REPLACEMENT     TOTAL KNEE ARTHROPLASTY Left 03/18/2016   Procedure: TOTAL KNEE ARTHROPLASTY;  Surgeon: Kennedy Bucker, MD;  Location: [facility] ORS;  Service: Orthopedics;  Laterality: Left;   TRANSURETHRAL RESECTION OF BLADDER TUMOR N/A 09/19/2016   Procedure: TRANSURETHRAL RESECTION OF BLADDER TUMOR (TURBT) MEDIUM;  Surgeon: Hildred Laser, MD;  Location: [facility] ORS;  Service: Urology;  Laterality: N/A;    Family History  Problem Relation Age of Onset   Stroke Father    Diabetes Sister    Breast cancer Sister    Cancer Maternal Aunt    Hematuria Neg Hx    Renal cancer Neg Hx    Prostate cancer Neg Hx    Bladder Cancer Neg Hx     Social History   Tobacco Use   Smoking status: Never   Smokeless tobacco: Never  Vaping Use   Vaping Use: Never used  Substance Use Topics   Alcohol use: No   Drug use: No    Medications: I have reviewed the patient's current medications. Prior to Admission:  Medications Prior to Admission  Medication Sig Dispense Refill Last Dose   acetaminophen (TYLENOL) 650 MG CR tablet Take 1,300 mg by mouth daily as needed for pain.   UNK   celecoxib (CELEBREX) 100 MG capsule Take 100 mg by mouth daily.   10/19/2022   cetirizine (ZYRTEC) 10 MG tablet Take 10 mg by mouth daily.   10/19/2022  finasteride (PROSCAR) 5 MG tablet Take 1 tablet (5 mg total) by mouth daily. 90 tablet 3 10/19/2022   ibuprofen (ADVIL) 200 MG tablet Take 200 mg by mouth every 4 (four) hours as needed for mild pain or moderate pain.   Past Week   pantoprazole (PROTONIX) 40 MG tablet Take 40 mg by mouth daily.   10/19/2022   Vibegron (GEMTESA) 75 MG TABS Take 1 tablet (75 mg total) by mouth daily. 30 tablet 11 10/19/2022   vitamin B-12 (CYANOCOBALAMIN) 1000 MCG tablet Take 1,000 mcg by mouth daily.    10/19/2022   VITAMIN D PO Take 1 tablet by mouth daily.   10/19/2022   gabapentin (NEURONTIN) 100 MG capsule Take 200 mg by mouth 2 (two) times daily. (Patient not  taking: Reported on 10/21/2022)   Not Taking   Scheduled:  ascorbic acid  500 mg Oral Daily   finasteride  5 mg Oral Daily   mirabegron ER  25 mg Oral Daily   pantoprazole  40 mg Oral Daily   senna-docusate  1 tablet Oral BID   zinc sulfate  220 mg Oral Daily   Continuous: NUU:[id number] **OR** acetaminophen, ondansetron **OR** ondansetron (ZOFRAN) IV, polyethylene glycol  No Known Allergies   ROS:  Pertinent items are noted in HPI.  Blood pressure 104/68, pulse (!) 59, temperature 99.1 F (37.3 C), temperature source Oral, resp. rate 14, height 5\' 9"  (1.753 m), weight 88 kg, SpO2 97 %. Physical Exam Vitals reviewed.  Constitutional:      Appearance: Normal appearance.  HENT:     Head: Normocephalic and atraumatic.  Eyes:     Extraocular Movements: Extraocular movements intact.     Pupils: Pupils are equal, round, and reactive to light.  Cardiovascular:     Rate and Rhythm: Normal rate.  Pulmonary:     Effort: Pulmonary effort is normal.  Abdominal:     Comments: Abdomen soft, nondistended, no percussion tenderness, nontender to palpation; no rigidity, guarding, rebound tenderness  Musculoskeletal:        General: Normal range of motion.     Cervical back: Normal range of motion.  Skin:    General: Skin is warm and dry.  Neurological:     General: No focal deficit present.     Mental Status: He is alert and oriented to person, place, and time.  Psychiatric:        Mood and Affect: Mood normal.        Behavior: Behavior normal.     Results: Results for orders placed or performed during the hospital encounter of 10/20/22 (from the past 48 hour(s))  Blood Culture (routine x 2)     Status: None (Preliminary result)   Collection Time: 10/20/22  4:05 PM   Specimen: Site Not Specified; Blood  Result Value Ref Range   Specimen Description SITE NOT SPECIFIED    Special Requests      BOTTLES DRAWN AEROBIC AND ANAEROBIC Blood Culture results may not be optimal due to  an excessive volume of blood received in culture bottles   Culture      NO GROWTH 2 DAYS Performed at [facility], [address], [place], [place] [address]    Report Status PENDING   Lactic acid, plasma     Status: None   Collection Time: 10/20/22  4:10 PM  Result Value Ref Range   Lactic Acid, Venous 1.0 0.5 - 1.9 mmol/L    Comment: Performed at [facility], [address] [place],  [place] [address]  Comprehensive metabolic panel     Status: Abnormal   Collection Time: 10/20/22  4:10 PM  Result Value Ref Range   Sodium 137 135 - 145 mmol/L   Potassium 3.7 3.5 - 5.1 mmol/L   Chloride 103 98 - 111 mmol/L   CO2 26 22 - 32 mmol/L   Glucose, Bld 117 (H) 70 - 99 mg/dL    Comment: Glucose reference range applies only to samples taken after fasting for at least 8 hours.   BUN 23 8 - 23 mg/dL   Creatinine, Ser 6.04 0.61 - 1.24 mg/dL   Calcium 8.6 (L) 8.9 - 10.3 mg/dL   Total Protein 6.4 (L) 6.5 - 8.1 g/dL   Albumin 3.4 (L) 3.5 - 5.0 g/dL   AST 13 (L) 15 - 41 U/L   ALT 12 0 - 44 U/L   Alkaline Phosphatase 76 38 - 126 U/L   Total Bilirubin 0.7 0.3 - 1.2 mg/dL   GFR, Estimated >54 >09 mL/min    Comment: (NOTE) Calculated using the CKD-EPI Creatinine Equation (2021)    Anion gap 8 5 - 15    Comment: Performed at [facility], [address] [place], [place] [address]  CBC with Differential     Status: Abnormal   Collection Time: 10/20/22  4:10 PM  Result Value Ref Range   WBC 8.6 4.0 - 10.5 K/uL   RBC 4.48 4.22 - 5.81 MIL/uL   Hemoglobin 11.2 (L) 13.0 - 17.0 g/dL   HCT 47.8 (L) 29.5 - 62.1 %   MCV 80.6 80.0 - 100.0 fL   MCH 25.0 (L) 26.0 - 34.0 pg   MCHC 31.0 30.0 - 36.0 g/dL   RDW 30.8 65.7 - 84.6 %   Platelets 176 150 - 400 K/uL   nRBC 0.0 0.0 - 0.2 %   Neutrophils Relative % 82 %   Neutro Abs 7.0 1.7 - 7.7 K/uL   Lymphocytes Relative 6 %   Lymphs Abs 0.5 (L) 0.7 - 4.0 K/uL   Monocytes Relative 12 %   Monocytes Absolute 1.0 0.1 - 1.0 K/uL   Eosinophils Relative 0 %    Eosinophils Absolute 0.0 0.0 - 0.5 K/uL   Basophils Relative 0 %   Basophils Absolute 0.0 0.0 - 0.1 K/uL   Immature Granulocytes 0 %   Abs Immature Granulocytes 0.02 0.00 - 0.07 K/uL    Comment: Performed at [facility], [address], [place], [place] [address]  Protime-INR     Status: None   Collection Time: 10/20/22  4:10 PM  Result Value Ref Range   Prothrombin Time 15.0 11.4 - 15.2 seconds   INR 1.2 0.8 - 1.2    Comment: (NOTE) INR goal varies based on device and disease states. Performed at [facility], [address], [place], [place] [address]   APTT     Status: None   Collection Time: 10/20/22  4:10 PM  Result Value Ref Range   aPTT 28 24 - 36 seconds    Comment: Performed at [facility], [address] [place], [place] [address]  Blood Culture (routine x 2)     Status: None (Preliminary result)   Collection Time: 10/20/22  4:10 PM   Specimen: BLOOD RIGHT ARM  Result Value Ref Range   Specimen Description BLOOD RIGHT ARM    Special Requests      BOTTLES DRAWN AEROBIC AND ANAEROBIC Blood Culture results may not be optimal due to an excessive volume of blood  received in culture bottles   Culture      NO GROWTH 2 DAYS Performed at [facility], [address] [place], [place] [address]    Report Status PENDING   Resp panel by RT-PCR (RSV, Flu A&B, Covid) Anterior Nasal Swab     Status: Abnormal   Collection Time: 10/20/22  4:11 PM   Specimen: Anterior Nasal Swab  Result Value Ref Range   SARS Coronavirus 2 by RT PCR POSITIVE (A) NEGATIVE    Comment: (NOTE) SARS-CoV-2 target nucleic acids are DETECTED.  The SARS-CoV-2 RNA is generally detectable in upper respiratory specimens during the acute phase of infection. Positive results are indicative of the presence of the identified virus, but do not rule out bacterial infection or co-infection with other pathogens not detected by the test. Clinical correlation with patient history and other diagnostic information is  necessary to determine patient infection status. The expected result is Negative.  Fact Sheet for Patients: BloggerCourse.com  Fact Sheet for Healthcare Providers: SeriousBroker.it  This test is not yet approved or cleared by the [place] FDA and  has been authorized for detection and/or diagnosis of SARS-CoV-2 by FDA under an Emergency Use Authorization (EUA).  This EUA will remain in effect (meaning this test can be used) for the duration of  the COVID-19 declaration under Section 564(b)(1) of the A ct, 21 U.S.C. section 360bbb-3(b)(1), unless the authorization is terminated or revoked sooner.     Influenza A by PCR NEGATIVE NEGATIVE   Influenza B by PCR NEGATIVE NEGATIVE    Comment: (NOTE) The Xpert Xpress SARS-CoV-2/FLU/RSV plus assay is intended as an aid in the diagnosis of influenza from Nasopharyngeal swab specimens and should not be used as a sole basis for treatment. Nasal washings and aspirates are unacceptable for Xpert Xpress SARS-CoV-2/FLU/RSV testing.  Fact Sheet for Patients: BloggerCourse.com  Fact Sheet for Healthcare Providers: SeriousBroker.it  This test is not yet approved or cleared by the [place] FDA and has been authorized for detection and/or diagnosis of SARS-CoV-2 by FDA under an Emergency Use Authorization (EUA). This EUA will remain in effect (meaning this test can be used) for the duration of the COVID-19 declaration under Section 564(b)(1) of the Act, 21 U.S.C. section 360bbb-3(b)(1), unless the authorization is terminated or revoked.     Resp Syncytial Virus by PCR NEGATIVE NEGATIVE    Comment: (NOTE) Fact Sheet for Patients: BloggerCourse.com  Fact Sheet for Healthcare Providers: SeriousBroker.it  This test is not yet approved or cleared by the [place] FDA and has been  authorized for detection and/or diagnosis of SARS-CoV-2 by FDA under an Emergency Use Authorization (EUA). This EUA will remain in effect (meaning this test can be used) for the duration of the COVID-19 declaration under Section 564(b)(1) of the Act, 21 U.S.C. section 360bbb-3(b)(1), unless the authorization is terminated or revoked.  Performed at [facility], [address], [place], [place] [address]   Urinalysis, Routine w reflex microscopic -Urine, Clean Catch     Status: Abnormal   Collection Time: 10/20/22  4:29 PM  Result Value Ref Range   Color, Urine YELLOW YELLOW   APPearance HAZY (A) CLEAR   Specific Gravity, Urine 1.014 1.005 - 1.030   pH 5.0 5.0 - 8.0   Glucose, UA NEGATIVE NEGATIVE mg/dL   Hgb urine dipstick MODERATE (A) NEGATIVE   Bilirubin Urine NEGATIVE NEGATIVE   Ketones, ur NEGATIVE NEGATIVE mg/dL   Protein, ur 30 (A) NEGATIVE mg/dL   Nitrite NEGATIVE NEGATIVE  Leukocytes,Ua MODERATE (A) NEGATIVE   RBC / HPF 21-50 0 - 5 RBC/hpf   WBC, UA >50 0 - 5 WBC/hpf   Bacteria, UA MANY (A) NONE SEEN   Squamous Epithelial / HPF 0-5 0 - 5 /HPF   WBC Clumps PRESENT    Mucus PRESENT     Comment: Performed at [facility], [address] [place], [place] [address]  Urine Culture (for pregnant, neutropenic or urologic patients or patients with an indwelling urinary catheter)     Status: Abnormal   Collection Time: 10/20/22  4:29 PM   Specimen: Urine, Clean Catch  Result Value Ref Range   Specimen Description      URINE, CLEAN CATCH Performed at [facility], [address], [place], [place] [address]    Special Requests      NONE Performed at [facility], [address] [place], [place] [address]    Culture >=100,000 COLONIES/mL ESCHERICHIA COLI (A)    Report Status 10/22/2022 FINAL    Organism ID, Bacteria ESCHERICHIA COLI (A)       Susceptibility   Escherichia coli - MIC*    AMPICILLIN <=2 SENSITIVE Sensitive     CEFAZOLIN <=4 SENSITIVE Sensitive     CEFEPIME  <=0.12 SENSITIVE Sensitive     CEFTRIAXONE <=0.25 SENSITIVE Sensitive     CIPROFLOXACIN <=0.25 SENSITIVE Sensitive     GENTAMICIN <=1 SENSITIVE Sensitive     IMIPENEM <=0.25 SENSITIVE Sensitive     NITROFURANTOIN <=16 SENSITIVE Sensitive     TRIMETH/SULFA <=20 SENSITIVE Sensitive     AMPICILLIN/SULBACTAM <=2 SENSITIVE Sensitive     PIP/TAZO <=4 SENSITIVE Sensitive     * >=100,000 COLONIES/mL ESCHERICHIA COLI  Lactic acid, plasma     Status: None   Collection Time: 10/20/22  6:44 PM  Result Value Ref Range   Lactic Acid, Venous 0.9 0.5 - 1.9 mmol/L    Comment: Performed at [facility], [address] [place], [place] [address]  Basic metabolic panel     Status: Abnormal   Collection Time: 10/21/22  4:39 AM  Result Value Ref Range   Sodium 140 135 - 145 mmol/L   Potassium 3.6 3.5 - 5.1 mmol/L   Chloride 107 98 - 111 mmol/L   CO2 23 22 - 32 mmol/L   Glucose, Bld 97 70 - 99 mg/dL    Comment: Glucose reference range applies only to samples taken after fasting for at least 8 hours.   BUN 21 8 - 23 mg/dL   Creatinine, Ser 9.52 0.61 - 1.24 mg/dL   Calcium 8.3 (L) 8.9 - 10.3 mg/dL   GFR, Estimated >84 >13 mL/min    Comment: (NOTE) Calculated using the CKD-EPI Creatinine Equation (2021)    Anion gap 10 5 - 15    Comment: Performed at [facility], [address] [place], [place] [address]  CBC     Status: Abnormal   Collection Time: 10/21/22  4:39 AM  Result Value Ref Range   WBC 9.1 4.0 - 10.5 K/uL   RBC 4.08 (L) 4.22 - 5.81 MIL/uL   Hemoglobin 10.2 (L) 13.0 - 17.0 g/dL   HCT 02.7 (L) 25.3 - 66.4 %   MCV 80.4 80.0 - 100.0 fL   MCH 25.0 (L) 26.0 - 34.0 pg   MCHC 31.1 30.0 - 36.0 g/dL   RDW 40.3 (H) 47.4 - 25.9 %   Platelets 177 150 - 400 K/uL   nRBC 0.0 0.0 - 0.2 %    Comment: Performed  at [facility], [address] [place], [place] [address]  Ferritin     Status: None   Collection Time: 10/21/22  4:39 AM  Result Value Ref Range   Ferritin 142 24 - 336 ng/mL    Comment:  Performed at [facility], [address] [place], [place] [address]  CBC     Status: Abnormal   Collection Time: 10/22/22  4:45 AM  Result Value Ref Range   WBC 6.1 4.0 - 10.5 K/uL   RBC 4.39 4.22 - 5.81 MIL/uL   Hemoglobin 11.0 (L) 13.0 - 17.0 g/dL   HCT 09.8 (L) 11.9 - 14.7 %   MCV 79.5 (L) 80.0 - 100.0 fL   MCH 25.1 (L) 26.0 - 34.0 pg   MCHC 31.5 30.0 - 36.0 g/dL   RDW 82.9 (H) 56.2 - 13.0 %   Platelets 190 150 - 400 K/uL   nRBC 0.0 0.0 - 0.2 %    Comment: Performed at [facility], [address] [place], [place] [address]  Comprehensive metabolic panel     Status: Abnormal   Collection Time: 10/22/22  4:45 AM  Result Value Ref Range   Sodium 134 (L) 135 - 145 mmol/L   Potassium 3.5 3.5 - 5.1 mmol/L   Chloride 102 98 - 111 mmol/L   CO2 23 22 - 32 mmol/L   Glucose, Bld 92 70 - 99 mg/dL    Comment: Glucose reference range applies only to samples taken after fasting for at least 8 hours.   BUN 15 8 - 23 mg/dL   Creatinine, Ser 4.69 0.61 - 1.24 mg/dL   Calcium 8.2 (L) 8.9 - 10.3 mg/dL   Total Protein 5.8 (L) 6.5 - 8.1 g/dL   Albumin 2.7 (L) 3.5 - 5.0 g/dL   AST 16 15 - 41 U/L   ALT 14 0 - 44 U/L   Alkaline Phosphatase 69 38 - 126 U/L   Total Bilirubin 0.7 0.3 - 1.2 mg/dL   GFR, Estimated >62 >95 mL/min    Comment: (NOTE) Calculated using the CKD-EPI Creatinine Equation (2021)    Anion gap 9 5 - 15    Comment: Performed at [facility], [address], [place], [place] [address]  Magnesium     Status: None   Collection Time: 10/22/22  4:45 AM  Result Value Ref Range   Magnesium 1.7 1.7 - 2.4 mg/dL    Comment: Performed at [facility], [address], [place], [place] [address]    CT ABDOMEN PELVIS W CONTRAST  Result Date: 10/20/2022 CLINICAL DATA:  Abdominal pain. EXAM: CT ABDOMEN AND PELVIS WITH CONTRAST TECHNIQUE: Multidetector CT imaging of the abdomen and pelvis was performed using the standard protocol following bolus administration of intravenous contrast.  RADIATION DOSE REDUCTION: This exam was performed according to the departmental dose-optimization program which includes automated exposure control, adjustment of the mA and/or kV according to patient size and/or use of iterative reconstruction technique. CONTRAST:  OMNIPAQUE IOHEXOL 300 MG/ML  SOLN COMPARISON:  None Available. FINDINGS: Lower chest: Moderate severity atelectasis is seen within the posterior aspect of the bilateral lung bases. Hepatobiliary: An 11 mm diameter hepatic cyst versus hemangioma is noted within the right lobe the liver (axial CT image 30, CT series 2). The gallbladder is contracted. No gallstones, gallbladder wall thickening, or biliary dilatation. Pancreas: Unremarkable. No pancreatic ductal dilatation or surrounding inflammatory changes. Spleen: Normal in size without focal abnormality. Adrenals/Urinary Tract: Adrenal glands are unremarkable. Kidneys are normal, without renal  calculi, focal lesion, or hydronephrosis. There is moderate severity diffuse urinary bladder wall thickening. Stomach/Bowel: Stomach is within normal limits. Appendix appears normal (axial CT images 55 through 61, CT series 2). A large amount of stool is seen throughout the colon. No evidence of bowel wall thickening, distention, or inflammatory changes. A curvilinear area of air attenuation is seen within the anterior aspect of the right upper quadrant below the anterior aspect of the right hemidiaphragm (axial CT images 14 through 24, CT series 2). Adjacent partially air-filled loops of bowel are also seen. Vascular/Lymphatic: Aortic atherosclerosis. A small amount of intraluminal low attenuation is seen within the proximal aspect of the splenic vein (axial CT images 35 and 36, CT series 2/coronal reformatted images 47 through 53, CT series 6). No enlarged abdominal or pelvic lymph nodes. Reproductive: Moderate to marked severity prostate gland enlargement is seen with mass effect noted on the base of the  urinary bladder. Other: No abdominal wall hernia or abnormality. No abdominopelvic ascites. Musculoskeletal: Marked severity multilevel degenerative changes are seen throughout the lumbar spine. IMPRESSION: 1. Area of air attenuation within the anterior aspect of the right upper quadrant, as described above. While this may represent a partially air-filled loop of bowel, the presence of a small amount of free intraperitoneal air cannot be excluded. Correlation with follow-up abdomen and pelvis CT is recommended. 2. Mild amount of thrombus within the splenic vein. 3. Diffuse urinary bladder wall thickening which may be secondary to chronic bladder outlet obstruction. Correlation with urinalysis is recommended to exclude the presence of a superimposed cystitis. 4. Moderate to marked severity prostatomegaly. Electronically Signed   By: Aram Candela M.D.   On: 10/20/2022 18:18   DG Chest 2 View  Result Date: 10/20/2022 CLINICAL DATA:  Fever. EXAM: CHEST - 2 VIEW COMPARISON:  None Available. FINDINGS: The heart size and mediastinal contours are within normal limits. Low lung volumes are noted. Mild atelectasis is seen within the bilateral lung bases, left greater than right. No pleural effusion or pneumothorax is identified. The visualized skeletal structures are unremarkable. IMPRESSION: Low lung volumes with mild bibasilar atelectasis, left greater than right. Electronically Signed   By: Aram Candela M.D.   On: 10/20/2022 17:59     Assessment & Plan:  Travis [name] is a 89 y.o. male who was admitted with UTI.  General surgery consulted secondary to CT finding of questionable free intraperitoneal air.  Imaging and blood work evaluated by myself.  -On my evaluation of the imaging, I believe all of the air below the right hemidiaphragm is intraluminal.  I did review this imaging with Dr. Llana Aliment, who agrees that all of this air appears intraluminal -I also discussed with the patient that given his  benign abdominal exam, stable hemodynamics, and relatively normal blood work, it is very unlikely that he has some severe intra-abdominal process going on -No need for any surgical intervention at this time -Okay for diet from surgical standpoint -Will order bowel regimen for patient given constipation -Care per primary team -General surgery to sign off.  Please call with any questions or concerns  All questions were answered to the satisfaction of the patient and family.  -- Theophilus Kinds, DO [facility] [address] [place], [place] [address] [phone] (office)

## 2022-10-22 NOTE — Evaluation (Signed)
Physical Therapy Evaluation Patient Details Name: Travis [name] MRN: [id number] DOB: June 09, 1931 Today's Date: 10/22/2022  History of Present Illness  Travis [name] is a 89 y.o. male with medical history significant for BPH, HTN, Bladder cancer.   Patient presented to the ED with reports of weakness, blood in urine.  Patient is awake alert oriented and able to give a history, daughter is at bedside.  Patient had fever and chills today.  Also reports patient's urinating blood today with clots, and positive history of urinary frequency, but no dysuria.  Has also had right lower quadrant abdominal pain.  No cough, No difficulty breathing.  He has never had COVID before.  He received 3-4 doses of COVID-vaccine.   Dementia documented in problem list, reports this is very mild.  At baseline ambulates with a cane.   Clinical Impression  Patient demonstrates slow labored movement for getting into/out of bed  due to generalized weakness and fatigues easily, limited to a few shuffling side steps at bedside before having to sit due to legs buckling and poor standing balance.  Patient tolerated sitting up in chair with his son present after therapy.  Patient will benefit from continued skilled physical therapy in hospital and recommended venue below to increase strength, balance, endurance for safe ADLs and gait.          Recommendations for follow up therapy are one component of a multi-disciplinary discharge planning process, led by the attending physician.  Recommendations may be updated based on patient status, additional functional criteria and insurance authorization.  Follow Up Recommendations Can patient physically be transported by private vehicle: No     Assistance Recommended at Discharge Intermittent Supervision/Assistance  Patient can return home with the following  A lot of help with bathing/dressing/bathroom;A lot of help with walking and/or transfers;Help with stairs or ramp for  entrance;Assistance with cooking/housework    Equipment Recommendations Rolling walker (2 wheels)  Recommendations for Other Services       Functional Status Assessment Patient has had a recent decline in their functional status and demonstrates the ability to make significant improvements in function in a reasonable and predictable amount of time.     Precautions / Restrictions Precautions Precautions: Fall Restrictions Weight Bearing Restrictions: No      Mobility  Bed Mobility Overal bed mobility: Needs Assistance Bed Mobility: Supine to Sit, Rolling, Sidelying to Sit Rolling: Min assist, Mod assist Sidelying to sit: Mod assist Supine to sit: Mod assist, Max assist     General bed mobility comments: slow labored movement    Transfers Overall transfer level: Needs assistance Equipment used: Rolling walker (2 wheels) Transfers: Sit to/from Stand, Bed to chair/wheelchair/BSC Sit to Stand: Mod assist   Step pivot transfers: Mod assist       General transfer comment: unsteady labored movement with very short step/stride length    Ambulation/Gait Ambulation/Gait assistance: Mod assist, Max assist Gait Distance (Feet): 5 Feet Assistive device: Rolling walker (2 wheels) Gait Pattern/deviations: Decreased step length - right, Decreased step length - left, Decreased stride length, Trunk flexed, Shuffle Gait velocity: slow     General Gait Details: limited to a few shuffling side steps, poor standing balance and fatigues easily  Stairs            Wheelchair Mobility    Modified Rankin (Stroke Patients Only)       Balance Overall balance assessment: Needs assistance Sitting-balance support: Feet supported, No upper extremity supported Sitting balance-Leahy Scale: Poor Sitting balance -  Comments: fair/poor seated at EOB Postural control: Posterior lean Standing balance support: Reliant on assistive device for balance, During functional activity, Bilateral  upper extremity supported Standing balance-Leahy Scale: Poor Standing balance comment: using RW                             Pertinent Vitals/Pain Pain Assessment Pain Assessment: No/denies pain    Home Living Family/patient expects to be discharged to:: Private residence Living Arrangements: Children Available Help at Discharge: Family;Available 24 hours/day Type of Home: House Home Access: Stairs to enter Entrance Stairs-Rails: Right;Left;Can reach both Entrance Stairs-Number of Steps: 2   Home Layout: One level Home Equipment: Standard Walker;Cane - single point;Grab bars - tub/shower;Shower seat;BSC/3in1      Prior Function Prior Level of Function : Needs assist       Physical Assist : Mobility (physical);ADLs (physical) Mobility (physical): Bed mobility;Transfers;Gait   Mobility Comments: household ambulator using RW lately, was using SPC ADLs Comments: assisted by family     Hand Dominance   Dominant Hand: Right    Extremity/Trunk Assessment   Upper Extremity Assessment Upper Extremity Assessment: Defer to OT evaluation    Lower Extremity Assessment Lower Extremity Assessment: Generalized weakness    Cervical / Trunk Assessment Cervical / Trunk Assessment: Kyphotic  Communication   Communication: No difficulties  Cognition Arousal/Alertness: Awake/alert Behavior During Therapy: WFL for tasks assessed/performed Overall Cognitive Status: Within Functional Limits for tasks assessed                                          General Comments      Exercises     Assessment/Plan    PT Assessment Patient needs continued PT services  PT Problem List Decreased strength;Decreased activity tolerance;Decreased balance;Decreased mobility       PT Treatment Interventions DME instruction;Gait training;Stair training;Functional mobility training;Therapeutic activities;Therapeutic exercise;Patient/family education;Balance training     PT Goals (Current goals can be found in the Care Plan section)  Acute Rehab PT Goals Patient Stated Goal: return home with family to assist PT Goal Formulation: With patient/family Time For Goal Achievement: 11/05/22 Potential to Achieve Goals: Good    Frequency Min 3X/week     Co-evaluation               AM-PAC PT "6 Clicks" Mobility  Outcome Measure Help needed turning from your back to your side while in a flat bed without using bedrails?: A Lot Help needed moving from lying on your back to sitting on the side of a flat bed without using bedrails?: A Lot Help needed moving to and from a bed to a chair (including a wheelchair)?: A Lot Help needed standing up from a chair using your arms (e.g., wheelchair or bedside chair)?: A Lot Help needed to walk in hospital room?: A Lot Help needed climbing 3-5 steps with a railing? : Total 6 Click Score: 11    End of Session   Activity Tolerance: Patient tolerated treatment well;Patient limited by fatigue Patient left: in chair;with call bell/phone within reach;with family/visitor present Nurse Communication: Mobility status PT Visit Diagnosis: Unsteadiness on feet (R26.81);Other abnormalities of gait and mobility (R26.89);Muscle weakness (generalized) (M62.81)    Time: 1610-9604 PT Time Calculation (min) (ACUTE ONLY): 29 min   Charges:   PT Evaluation $PT Eval Moderate Complexity: 1 Mod PT Treatments $Therapeutic Activity:  23-37 mins        4:06 PM, 10/22/22 Ocie Bob, MPT Physical Therapist with [facility] 336 [phone] office [phone] mobile phone

## 2022-10-22 NOTE — Progress Notes (Signed)
Patients son is bedside and refusing bed alarm, stated he would tell us when he leaves, and that he is staying overnight.

## 2022-10-22 NOTE — TOC Initial Note (Signed)
Transition of Care [facility]) - Initial/Assessment Note    Patient Details  Name: Travis [name] MRN: [id number] Date of Birth: 31-Dec-1931  Transition of Care [facility]) CM/SW Contact:    Leitha Bleak, RN Phone Number: 10/22/2022, 4:09 PM  Clinical Narrative:     PT recommending SNF if daughter can not handel mod assist. CM spoke with his son. He state patient is at his baseline, she assist him now in the home. The daughter is out of reach this afternoon. He will discuss with her this evening and want TOC to call back tomorrow. He is thinking the will go home with HH. FL2 started. TOC following.                Barriers to Discharge: Continued Medical Work up   Patient Goals and CMS Choice Patient states their goals for this hospitalization and ongoing recovery are:: to get stronger CMS Medicare.gov Compare Post Acute Care list provided to:: Patient Represenative (must comment) Choice offered to / list presented to : Adult Children      Expected Discharge Plan and Services       Living arrangements for the past 2 months: Single Family Home                   Prior Living Arrangements/Services Living arrangements for the past 2 months: Single Family Home   Patient language and need for interpreter reviewed:: Yes        Need for Family Participation in Patient Care: Yes (Comment)     Criminal Activity/Legal Involvement Pertinent to Current Situation/Hospitalization: No - Comment as needed  Activities of Daily Living Home Assistive Devices/Equipment: Walker (specify type), Raised toilet seat with rails, Grab bars in shower ADL Screening (condition at time of admission) Patient's cognitive ability adequate to safely complete daily activities?: Yes Is the patient deaf or have difficulty hearing?: No Does the patient have difficulty seeing, even when wearing glasses/contacts?: Yes Does the patient have difficulty concentrating, remembering, or making decisions?: Yes Patient able to  express need for assistance with ADLs?: Yes Does the patient have difficulty dressing or bathing?: Yes Independently performs ADLs?: No Communication: Independent Dressing (OT): Dependent Is this a change from baseline?: Pre-admission baseline Grooming: Needs assistance Is this a change from baseline?: Pre-admission baseline Feeding: Independent with device (comment) Bathing: Needs assistance, Dependent Is this a change from baseline?: Pre-admission baseline Toileting: Needs assistance Is this a change from baseline?: Pre-admission baseline In/Out Bed: Needs assistance Is this a change from baseline?: Pre-admission baseline Walks in Home: Independent with device (comment) Is this a change from baseline?: Pre-admission baseline Does the patient have difficulty walking or climbing stairs?: Yes Weakness of Legs: Both Weakness of Arms/Hands: Both  Permission Sought/Granted       Emotional Assessment       Orientation: : Oriented to Self, Oriented to Situation Alcohol / Substance Use: Not Applicable Psych Involvement: No (comment)  Admission diagnosis:  Acute cystitis with hematuria [N30.01] Sepsis (HCC) [A41.9] COVID-19 [U07.1] Patient Active Problem List   Diagnosis Date Noted   Sepsis secondary to UTI (HCC) 10/20/2022   Hematuria 10/20/2022   COVID-19 virus infection 10/20/2022   Bladder cancer (HCC) 10/20/2022   Essential tremor 04/29/2016   Primary osteoarthritis of left knee 03/18/2016   Hernia of abdominal cavity    B12 deficiency 10/23/2015   Difficulty walking 10/16/2015   Left knee pain 10/16/2015   MCI (mild cognitive impairment) 10/16/2015   Health care maintenance 01/23/2015   BPH  with obstruction/lower urinary tract symptoms 01/01/2014   HTN (hypertension), benign 01/01/2014   Hypercholesterolemia 01/01/2014   OA (osteoarthritis) 01/01/2014   PCP:  Lauro Regulus, MD Pharmacy:   [facility] [address] [place] - [address] AT  [address] [place] [address] Phone: [phone] Fax: [phone]     Social Determinants of Health (SDOH) Social History: SDOH Screenings   Food Insecurity: No Food Insecurity (10/20/2022)  Housing: Low Risk  (10/20/2022)  Transportation Needs: No Transportation Needs (10/20/2022)  Utilities: Not At Risk (10/20/2022)  Tobacco Use: Low Risk  (10/21/2022)   SDOH Interventions:     Readmission Risk Interventions     No data to display

## 2022-10-22 NOTE — Progress Notes (Signed)
Patient alert with son at bedside. Patient was assisted oob to recliner chair. No complaints at this time.

## 2022-10-22 NOTE — Plan of Care (Signed)
  Problem: Acute Rehab PT Goals(only PT should resolve) Goal: Pt Will Go Supine/Side To Sit Outcome: Progressing Flowsheets (Taken 10/22/2022 1607) Pt will go Supine/Side to Sit:  with minimal assist  with min guard assist Goal: Patient Will Transfer Sit To/From Stand Outcome: Progressing Flowsheets (Taken 10/22/2022 1607) Patient will transfer sit to/from stand:  with min guard assist  with minimal assist Goal: Pt Will Transfer Bed To Chair/Chair To Bed Outcome: Progressing Flowsheets (Taken 10/22/2022 1607) Pt will Transfer Bed to Chair/Chair to Bed:  with min assist  with mod assist Goal: Pt Will Ambulate Outcome: Progressing Flowsheets (Taken 10/22/2022 1607) Pt will Ambulate:  25 feet  with minimal assist  with rolling walker   4:08 PM, 10/22/22 Ocie Bob, MPT Physical Therapist with [facility] 336 [phone] office [phone] mobile phone

## 2022-10-23 DIAGNOSIS — N390 Urinary tract infection, site not specified: Secondary | ICD-10-CM | POA: Diagnosis not present

## 2022-10-23 DIAGNOSIS — A419 Sepsis, unspecified organism: Secondary | ICD-10-CM | POA: Diagnosis not present

## 2022-10-23 LAB — CULTURE, BLOOD (ROUTINE X 2): Culture: NO GROWTH

## 2022-10-23 MED ORDER — CEFADROXIL 500 MG PO CAPS: 1000.0000 mg | ORAL_CAPSULE | Freq: Two times a day (BID) | ORAL | 0 refills | Status: AC

## 2022-10-23 MED ORDER — CEFADROXIL 500 MG PO CAPS
1000.0000 mg | ORAL_CAPSULE | Freq: Two times a day (BID) | ORAL | Status: DC
  Filled 2022-10-23 (×3): qty 2

## 2022-10-23 MED ORDER — ZINC SULFATE 220 (50 ZN) MG PO CAPS: 220.0000 mg | ORAL_CAPSULE | Freq: Every day | ORAL | 0 refills | Status: DC

## 2022-10-23 MED ORDER — ASCORBIC ACID 500 MG PO TABS: 500.0000 mg | ORAL_TABLET | Freq: Every day | ORAL | 0 refills | Status: AC

## 2022-10-23 NOTE — Discharge Instructions (Signed)
You need to Quarantine for 7 days form Covid diagnosis.   You have a new prescription for antibiotics for Urine infection.  Avoid celebrex and or Ibuprofen due to recent episode of bloody urine.

## 2022-10-23 NOTE — Progress Notes (Signed)
Patient and family state understanding of discharge instructions 

## 2022-10-23 NOTE — Plan of Care (Signed)
  Problem: Acute Rehab OT Goals (only OT should resolve) Goal: Pt. Will Perform Eating Flowsheets (Taken 10/23/2022 0944) Pt Will Perform Eating:  with modified independence  with adaptive utensils Goal: Pt. Will Perform Grooming Flowsheets (Taken 10/23/2022 0944) Pt Will Perform Grooming:  with modified independence  sitting Goal: Pt. Will Perform Upper Body Dressing Flowsheets (Taken 10/23/2022 0944) Pt Will Perform Upper Body Dressing:  with modified independence  sitting Goal: Pt. Will Perform Lower Body Dressing Flowsheets (Taken 10/23/2022 0944) Pt Will Perform Lower Body Dressing:  with modified independence  sitting/lateral leans Goal: Pt. Will Transfer To Toilet Flowsheets (Taken 10/23/2022 0944) Pt Will Transfer to Toilet:  with modified independence  with supervision  ambulating  Hadriel Northup OT, MOT

## 2022-10-23 NOTE — Evaluation (Signed)
Occupational Therapy Evaluation Patient Details Name: Travis [name] MRN: [id number] DOB: 1932/03/03 Today's Date: 10/23/2022   History of Present Illness Travis [name] is a 89 y.o. male with medical history significant for BPH, HTN, Bladder cancer.   Patient presented to the ED with reports of weakness, blood in urine.  Patient is awake alert oriented and able to give a history, daughter is at bedside.  Patient had fever and chills today.  Also reports patient's urinating blood today with clots, and positive history of urinary frequency, but no dysuria.  Has also had right lower quadrant abdominal pain.  No cough, No difficulty breathing.  He has never had COVID before.  He received 3-4 doses of COVID-vaccine.   Dementia documented in problem list, reports this is very mild.  At baseline ambulates with a cane.   Clinical Impression   Pt agreeable to OT and PT co-evaluation/treatment. Pt's daughter was present in the room and was able to observe and assist with pt mobility. Daughter reported that pt is near his baseline function. Pt is assisted PRN for ADL's and mobility at baseline. Today pt required mod A for bed mobility and min A for transfers/ambulation. Daughter reported feeling comfortable with level of support needed for the pt. Pt will benefit from continued OT in the hospital and recommended venue below to increase strength, balance, and endurance for safe ADL's.        Recommendations for follow up therapy are one component of a multi-disciplinary discharge planning process, led by the attending physician.  Recommendations may be updated based on patient status, additional functional criteria and insurance authorization.   Assistance Recommended at Discharge Frequent or constant Supervision/Assistance  Patient can return home with the following A little help with walking and/or transfers;A little help with bathing/dressing/bathroom;Assistance with cooking/housework;Assist for  transportation;Help with stairs or ramp for entrance;Direct supervision/assist for medications management    Functional Status Assessment  Patient has had a recent decline in their functional status and demonstrates the ability to make significant improvements in function in a reasonable and predictable amount of time.  Equipment Recommendations  Other (comment) (Given handout on adaptive utensils based on daughters report of needing solutions for pt's difficulty feeding.)    Recommendations for Other Services       Precautions / Restrictions Precautions Precautions: Fall Restrictions Weight Bearing Restrictions: No      Mobility Bed Mobility Overal bed mobility: Needs Assistance Bed Mobility: Supine to Sit     Supine to sit: Mod assist, HOB elevated     General bed mobility comments: slow labored movement    Transfers Overall transfer level: Needs assistance Equipment used: Rolling walker (2 wheels) Transfers: Sit to/from Stand, Bed to chair/wheelchair/BSC Sit to Stand: Mod assist     Step pivot transfers: Mod assist     General transfer comment: unsteady labored movement with very short step/stride length; daughter able to give pt assist and cuing for sit to stand from chair.      Balance Overall balance assessment: Needs assistance Sitting-balance support: Feet supported, No upper extremity supported Sitting balance-Leahy Scale: Poor Sitting balance - Comments: fair/poor seated at EOB Postural control: Posterior lean Standing balance support: Reliant on assistive device for balance, During functional activity, Bilateral upper extremity supported Standing balance-Leahy Scale: Fair Standing balance comment: using RW                           ADL either performed or assessed  with clinical judgement   ADL Overall ADL's : Needs assistance/impaired Eating/Feeding: Minimal assistance;Sitting   Grooming: Min guard;Minimal assistance;Sitting   Upper  Body Bathing: Min guard;Minimal assistance;Sitting   Lower Body Bathing: Moderate assistance;Sitting/lateral leans   Upper Body Dressing : Min guard;Minimal assistance;Sitting   Lower Body Dressing: Moderate assistance;Sitting/lateral leans;Maximal assistance Lower Body Dressing Details (indicate cue type and reason): Assited today by daughter to don shoes. Daughter reports pt can do this but it takes a very long time. Toilet Transfer: Minimal assistance;Stand-pivot;Rolling walker (2 wheels);Ambulation Toilet Transfer Details (indicate cue type and reason): Simulated via EOB to chair transfer. Toileting- Clothing Manipulation and Hygiene: Minimal assistance;Sitting/lateral lean;Moderate assistance;Sit to/from stand       Functional mobility during ADLs: Minimal assistance;Rolling walker (2 wheels) General ADL Comments: Pt able to ambulate from bed to door and back with RW     Vision Baseline Vision/History:  (Double vision at times.) Ability to See in Adequate Light: 2 Moderately impaired Patient Visual Report: Diplopia (Has been ongonig for a year.) Additional Comments: Impaired at baseline with family reporting need to see a vision specialist.                Pertinent Vitals/Pain Pain Assessment Pain Assessment: No/denies pain     Hand Dominance Right   Extremity/Trunk Assessment Upper Extremity Assessment Upper Extremity Assessment: Generalized weakness;LUE deficits/detail LUE Deficits / Details: 2+/5 shoulder MMT. Limited for many years. Generally weak otherwise.   Lower Extremity Assessment Lower Extremity Assessment: Defer to PT evaluation   Cervical / Trunk Assessment Cervical / Trunk Assessment: Kyphotic   Communication Communication Communication: No difficulties   Cognition Arousal/Alertness: Awake/alert Behavior During Therapy: WFL for tasks assessed/performed Overall Cognitive Status: Within Functional Limits for tasks assessed                                                         Home Living Family/patient expects to be discharged to:: Private residence Living Arrangements: Children Available Help at Discharge: Family;Available 24 hours/day Type of Home: House Home Access: Stairs to enter [employer] of Steps: 2 Entrance Stairs-Rails: Right;Left;Can reach both Home Layout: One level     Bathroom Shower/Tub: Chief Strategy Officer: Handicapped height Bathroom Accessibility: Yes   Home Equipment: Standard Walker;Cane - single point;Grab bars - tub/shower;Shower seat;BSC/3in1   Additional Comments: Per PT note      Prior Functioning/Environment Prior Level of Function : Needs assist       Physical Assist : Mobility (physical);ADLs (physical) Mobility (physical): Bed mobility;Transfers;Gait ADLs (physical): Bathing;Dressing;IADLs;Feeding Mobility Comments: household ambulator using RW lately, was using SPC ADLs Comments: PRN assist for bathing and dressing. Reports that eating is difficult. Assisted for IADL's.        OT Problem List: Decreased strength;Decreased range of motion;Decreased activity tolerance;Impaired balance (sitting and/or standing)      OT Treatment/Interventions: Self-care/ADL training;Therapeutic exercise;DME and/or AE instruction;Balance training;Patient/family education;Visual/perceptual remediation/compensation;Therapeutic activities    OT Goals(Current goals can be found in the care plan section) Acute Rehab OT Goals Patient Stated Goal: return home OT Goal Formulation: With family Time For Goal Achievement: 11/06/22 Potential to Achieve Goals: Good  OT Frequency: Min 1X/week    Co-evaluation PT/OT/SLP Co-Evaluation/Treatment: Yes Reason for Co-Treatment: To address functional/ADL transfers   OT goals addressed during session: ADL's and self-care  End of Session Equipment Utilized During Treatment: Rolling walker  (2 wheels);Gait belt  Activity Tolerance: Patient tolerated treatment well Patient left: in chair;with call bell/phone within reach;with family/visitor present  OT Visit Diagnosis: Unsteadiness on feet (R26.81);Other abnormalities of gait and mobility (R26.89);Muscle weakness (generalized) (M62.81)                Time: 1610-9604 OT Time Calculation (min): 32 min Charges:  OT General Charges $OT Visit: 1 Visit OT Evaluation $OT Eval Low Complexity: 1 Low  Amira Podolak OT, MOT  Danie Chandler 10/23/2022, 9:42 AM

## 2022-10-23 NOTE — Discharge Summary (Signed)
Physician Discharge Summary   Patient: JOHNEDWARD BLADEN MRN: [id number] DOB: 17-Aug-1931  Admit date:     10/20/2022  Discharge date: 10/23/22  Discharge Physician: Alba Cory   PCP: Lauro Regulus, MD   Recommendations at discharge:   Needs to follow up with Urology for further evaluation of Hematuria due to history of bladder cancer.  Needs to follow up with Dr Landry Corporal oncology for consideration of anticoagulation for splenic vein thrombus.  Follow up for resolution of Covid.   Discharge Diagnoses: Principal Problem:   Sepsis secondary to UTI [facility]) Active Problems:   Hematuria   COVID-19 virus infection   Bladder cancer (HCC)   BPH with obstruction/lower urinary tract symptoms   HTN (hypertension), benign   MCI (mild cognitive impairment)  Resolved Problems:   * No resolved hospital problems. *  Hospital Course: 89 year old with past medical history significant for BPH, hypertension, bladder cancer presents to the ED with weakness, hematuria.  Patient was having fevers and chills noted to have bloody urine and clots.  He has had lower quadrant abdominal pain.   Evaluation in the ED patient was found to have COVID test positive, chest x-ray was clear.  CT abdomen and pelvis showed small amount of thrombus within the splenic vein, small amount of free intraperitoneal air cannot be excluded.   Patient has been evaluated by surgery Dr. Robyne Peers does not think patient has pneumoperitoneum.   Case discussed with Dr. Ellin Saba with oncology, in regards a small amount of thrombus in the splenic vein.  Recommendation is to hold on starting anticoagulation at this time because of  small amount of thrombus.  Patient will need to follow-up with Dr. Ellin Saba in 1 week, he will reevaluate for hematuria and if cleared he will consider anticoagulation.      Assessment and Plan: 1-Sepsis secondary to UTI Patient presents with fever, tachypnea, respiration rate 18-27,  source of infection UTI Patient with UA with more than 50 white blood cell and hematuria In culture growing E. coli Treated  with IV ceftriaxone while in the hospital for 2 days. He will be discharge on 5 days of cefadroxil.    2-COVID 19 virus infection: Asymptomatic Chest x-ray negative for pneumonia Continue with supportive care Continue with vitamin C zinc Stable.   3-Hematuria Patient has a history of bladder cancer/BPH Could be related to UTI Will need outpatient follow-up with urology Continue with finasteride and Gemtesa Resolved.   4-Free ain on on CT scan;  Evaluated by Dr. Robyne Peers, air below the right hemidiaphragm appears to be intraluminal.  Abdomen is benign. No further recommendation per general surgery   5-Mild amount of  thrombus within the splenic vein: Case discussed with Dr. Ellin Saba with oncology, recommendation is to hold on starting anticoagulation due to recent hematuria.  Patient to follow-up with him in the office within a week he will recheck urine and will consider anticoagulation at that time     6-History of bladder cancer: Follow-up with Dr. Mena Goes Status post TURBT 09/2016. In 2018 2019 he had BCG induction and maintenance.  Needs to  follow-up with urology   7--Hypertension: Not on medication as an outpatient 8-Constipation; continue with miralax.  Hypomagnesemia;Replaced.  BPH: Continue with finasteride and Gemtesa         Consultants: Oncology, Surgery  Procedures performed: None  Disposition: Home Diet recommendation:  Discharge Diet Orders (From admission, onward)     Start     Ordered   10/23/22 0000  Diet -  low sodium heart healthy        10/23/22 1039           Cardiac diet DISCHARGE MEDICATION: Allergies as of 10/23/2022   No Known Allergies      Medication List     STOP taking these medications    celecoxib 100 MG capsule Commonly known as: CELEBREX   gabapentin 100 MG capsule Commonly known as:  NEURONTIN   ibuprofen 200 MG tablet Commonly known as: ADVIL       TAKE these medications    acetaminophen 650 MG CR tablet Commonly known as: TYLENOL Take 1,300 mg by mouth daily as needed for pain.   ascorbic acid 500 MG tablet Commonly known as: VITAMIN C Take 1 tablet (500 mg total) by mouth daily. Start taking on: October 24, 2022   cefadroxil 500 MG capsule Commonly known as: DURICEF Take 2 capsules (1,000 mg total) by mouth 2 (two) times daily for 5 days.   cetirizine 10 MG tablet Commonly known as: ZYRTEC Take 10 mg by mouth daily.   cyanocobalamin 1000 MCG tablet Commonly known as: VITAMIN B12 Take 1,000 mcg by mouth daily.   finasteride 5 MG tablet Commonly known as: PROSCAR Take 1 tablet (5 mg total) by mouth daily.   Gemtesa 75 MG Tabs Generic drug: Vibegron Take 1 tablet (75 mg total) by mouth daily.   pantoprazole 40 MG tablet Commonly known as: PROTONIX Take 40 mg by mouth daily.   VITAMIN D PO Take 1 tablet by mouth daily.   zinc sulfate 220 (50 Zn) MG capsule Take 1 capsule (220 mg total) by mouth daily. Start taking on: October 24, 2022        Follow-up Information     Lauro Regulus, MD Follow up in 1 week(s).   Specialty: Internal Medicine Contact information: [address] [facility] [place] [address] [phone]         Doreatha Massed, MD Follow up in 1 week(s).   Specialty: Hematology Contact information: [address] [place] [address] [phone]         [facility] Follow up.   Specialty: Home Health Services Why: Will contact you to schedule home health visits.               Discharge Exam: Filed Weights   10/20/22 1504  Weight: 88 kg   General; NAD  Condition at discharge: stable  The results of significant diagnostics from this hospitalization (including imaging, microbiology, ancillary and laboratory) are listed below for reference.    Imaging Studies: CT ABDOMEN PELVIS W CONTRAST  Result Date: 10/20/2022 CLINICAL DATA:  Abdominal pain. EXAM: CT ABDOMEN AND PELVIS WITH CONTRAST TECHNIQUE: Multidetector CT imaging of the abdomen and pelvis was performed using the standard protocol following bolus administration of intravenous contrast. RADIATION DOSE REDUCTION: This exam was performed according to the departmental dose-optimization program which includes automated exposure control, adjustment of the mA and/or kV according to patient size and/or use of iterative reconstruction technique. CONTRAST:  OMNIPAQUE IOHEXOL 300 MG/ML  SOLN COMPARISON:  None Available. FINDINGS: Lower chest: Moderate severity atelectasis is seen within the posterior aspect of the bilateral lung bases. Hepatobiliary: An 11 mm diameter hepatic cyst versus hemangioma is noted within the right lobe the liver (axial CT image 30, CT series 2). The gallbladder is contracted. No gallstones, gallbladder wall thickening, or biliary dilatation. Pancreas: Unremarkable. No pancreatic ductal dilatation or surrounding inflammatory changes.  Spleen: Normal in size without focal abnormality. Adrenals/Urinary Tract: Adrenal glands are unremarkable. Kidneys are normal, without renal calculi, focal lesion, or hydronephrosis. There is moderate severity diffuse urinary bladder wall thickening. Stomach/Bowel: Stomach is within normal limits. Appendix appears normal (axial CT images 55 through 61, CT series 2). A large amount of stool is seen throughout the colon. No evidence of bowel wall thickening, distention, or inflammatory changes. A curvilinear area of air attenuation is seen within the anterior aspect of the right upper quadrant below the anterior aspect of the right hemidiaphragm (axial CT images 14 through 24, CT series 2). Adjacent partially air-filled loops of bowel are also seen. Vascular/Lymphatic: Aortic atherosclerosis. A small amount of intraluminal low attenuation is  seen within the proximal aspect of the splenic vein (axial CT images 35 and 36, CT series 2/coronal reformatted images 47 through 53, CT series 6). No enlarged abdominal or pelvic lymph nodes. Reproductive: Moderate to marked severity prostate gland enlargement is seen with mass effect noted on the base of the urinary bladder. Other: No abdominal wall hernia or abnormality. No abdominopelvic ascites. Musculoskeletal: Marked severity multilevel degenerative changes are seen throughout the lumbar spine. IMPRESSION: 1. Area of air attenuation within the anterior aspect of the right upper quadrant, as described above. While this may represent a partially air-filled loop of bowel, the presence of a small amount of free intraperitoneal air cannot be excluded. Correlation with follow-up abdomen and pelvis CT is recommended. 2. Mild amount of thrombus within the splenic vein. 3. Diffuse urinary bladder wall thickening which may be secondary to chronic bladder outlet obstruction. Correlation with urinalysis is recommended to exclude the presence of a superimposed cystitis. 4. Moderate to marked severity prostatomegaly. Electronically Signed   By: Aram Candela M.D.   On: 10/20/2022 18:18   DG Chest 2 View  Result Date: 10/20/2022 CLINICAL DATA:  Fever. EXAM: CHEST - 2 VIEW COMPARISON:  None Available. FINDINGS: The heart size and mediastinal contours are within normal limits. Low lung volumes are noted. Mild atelectasis is seen within the bilateral lung bases, left greater than right. No pleural effusion or pneumothorax is identified. The visualized skeletal structures are unremarkable. IMPRESSION: Low lung volumes with mild bibasilar atelectasis, left greater than right. Electronically Signed   By: Aram Candela M.D.   On: 10/20/2022 17:59    Microbiology: Results for orders placed or performed during the hospital encounter of 10/20/22  Blood Culture (routine x 2)     Status: None (Preliminary result)    Collection Time: 10/20/22  4:05 PM   Specimen: Site Not Specified; Blood  Result Value Ref Range Status   Specimen Description SITE NOT SPECIFIED  Final   Special Requests   Final    BOTTLES DRAWN AEROBIC AND ANAEROBIC Blood Culture results may not be optimal due to an excessive volume of blood received in culture bottles   Culture   Final    NO GROWTH 3 DAYS Performed at [facility], [address] [place], [place] [address]    Report Status PENDING  Incomplete  Blood Culture (routine x 2)     Status: None (Preliminary result)   Collection Time: 10/20/22  4:10 PM   Specimen: BLOOD RIGHT ARM  Result Value Ref Range Status   Specimen Description BLOOD RIGHT ARM  Final   Special Requests   Final    BOTTLES DRAWN AEROBIC AND ANAEROBIC Blood Culture results may not be optimal due to an excessive volume of blood received in culture  bottles   Culture   Final    NO GROWTH 3 DAYS Performed at [facility], [address], [place], [place] [address]    Report Status PENDING  Incomplete  Resp panel by RT-PCR (RSV, Flu A&B, Covid) Anterior Nasal Swab     Status: Abnormal   Collection Time: 10/20/22  4:11 PM   Specimen: Anterior Nasal Swab  Result Value Ref Range Status   SARS Coronavirus 2 by RT PCR POSITIVE (A) NEGATIVE Final    Comment: (NOTE) SARS-CoV-2 target nucleic acids are DETECTED.  The SARS-CoV-2 RNA is generally detectable in upper respiratory specimens during the acute phase of infection. Positive results are indicative of the presence of the identified virus, but do not rule out bacterial infection or co-infection with other pathogens not detected by the test. Clinical correlation with patient history and other diagnostic information is necessary to determine patient infection status. The expected result is Negative.  Fact Sheet for Patients: BloggerCourse.com  Fact Sheet for Healthcare Providers: SeriousBroker.it  This  test is not yet approved or cleared by the [place] FDA and  has been authorized for detection and/or diagnosis of SARS-CoV-2 by FDA under an Emergency Use Authorization (EUA).  This EUA will remain in effect (meaning this test can be used) for the duration of  the COVID-19 declaration under Section 564(b)(1) of the A ct, 21 U.S.C. section 360bbb-3(b)(1), unless the authorization is terminated or revoked sooner.     Influenza A by PCR NEGATIVE NEGATIVE Final   Influenza B by PCR NEGATIVE NEGATIVE Final    Comment: (NOTE) The Xpert Xpress SARS-CoV-2/FLU/RSV plus assay is intended as an aid in the diagnosis of influenza from Nasopharyngeal swab specimens and should not be used as a sole basis for treatment. Nasal washings and aspirates are unacceptable for Xpert Xpress SARS-CoV-2/FLU/RSV testing.  Fact Sheet for Patients: BloggerCourse.com  Fact Sheet for Healthcare Providers: SeriousBroker.it  This test is not yet approved or cleared by the [place] FDA and has been authorized for detection and/or diagnosis of SARS-CoV-2 by FDA under an Emergency Use Authorization (EUA). This EUA will remain in effect (meaning this test can be used) for the duration of the COVID-19 declaration under Section 564(b)(1) of the Act, 21 U.S.C. section 360bbb-3(b)(1), unless the authorization is terminated or revoked.     Resp Syncytial Virus by PCR NEGATIVE NEGATIVE Final    Comment: (NOTE) Fact Sheet for Patients: BloggerCourse.com  Fact Sheet for Healthcare Providers: SeriousBroker.it  This test is not yet approved or cleared by the [place] FDA and has been authorized for detection and/or diagnosis of SARS-CoV-2 by FDA under an Emergency Use Authorization (EUA). This EUA will remain in effect (meaning this test can be used) for the duration of the COVID-19 declaration under  Section 564(b)(1) of the Act, 21 U.S.C. section 360bbb-3(b)(1), unless the authorization is terminated or revoked.  Performed at [facility], [address], [place], [place] [address]   Urine Culture (for pregnant, neutropenic or urologic patients or patients with an indwelling urinary catheter)     Status: Abnormal   Collection Time: 10/20/22  4:29 PM   Specimen: Urine, Clean Catch  Result Value Ref Range Status   Specimen Description   Final    URINE, CLEAN CATCH Performed at [facility], [address] [place], [place] [address]    Special Requests   Final    NONE Performed at [facility], [address], [place], [place] [address]    Culture >=100,000  COLONIES/mL ESCHERICHIA COLI (A)  Final   Report Status 10/22/2022 FINAL  Final   Organism ID, Bacteria ESCHERICHIA COLI (A)  Final      Susceptibility   Escherichia coli - MIC*    AMPICILLIN <=2 SENSITIVE Sensitive     CEFAZOLIN <=4 SENSITIVE Sensitive     CEFEPIME <=0.12 SENSITIVE Sensitive     CEFTRIAXONE <=0.25 SENSITIVE Sensitive     CIPROFLOXACIN <=0.25 SENSITIVE Sensitive     GENTAMICIN <=1 SENSITIVE Sensitive     IMIPENEM <=0.25 SENSITIVE Sensitive     NITROFURANTOIN <=16 SENSITIVE Sensitive     TRIMETH/SULFA <=20 SENSITIVE Sensitive     AMPICILLIN/SULBACTAM <=2 SENSITIVE Sensitive     PIP/TAZO <=4 SENSITIVE Sensitive     * >=100,000 COLONIES/mL ESCHERICHIA COLI    Labs: CBC: Recent Labs  Lab 10/20/22 1610 10/21/22 0439 10/22/22 0445  WBC 8.6 9.1 6.1  NEUTROABS 7.0  --   --   HGB 11.2* 10.2* 11.0*  HCT 36.1* 32.8* 34.9*  MCV 80.6 80.4 79.5*  PLT 176 177 190   Basic Metabolic Panel: Recent Labs  Lab 10/20/22 1610 10/21/22 0439 10/22/22 0445  NA 137 140 134*  K 3.7 3.6 3.5  CL 103 107 102  CO2 26 23 23   GLUCOSE 117* 97 92  BUN 23 21 15   CREATININE 1.03 0.98 0.85  CALCIUM 8.6* 8.3* 8.2*  MG  --   --  1.7   Liver Function Tests: Recent Labs  Lab 10/20/22 1610 10/22/22 0445  AST 13* 16   ALT 12 14  ALKPHOS 76 69  BILITOT 0.7 0.7  PROT 6.4* 5.8*  ALBUMIN 3.4* 2.7*   CBG: No results for input(s): "GLUCAP" in the last 168 hours.  Discharge time spent: greater than 30 minutes.  Signed: Alba Cory, MD [facility] 10/23/2022

## 2022-10-23 NOTE — Progress Notes (Signed)
Physical Therapy Treatment Patient Details Name: Travis [name] MRN: [id number] DOB: 1932/02/05 Today's Date: 10/23/2022   History of Present Illness Travis [name] is a 89 y.o. male with medical history significant for BPH, HTN, Bladder cancer.   Patient presented to the ED with reports of weakness, blood in urine.  Patient is awake alert oriented and able to give a history, daughter is at bedside.  Patient had fever and chills today.  Also reports patient's urinating blood today with clots, and positive history of urinary frequency, but no dysuria.  Has also had right lower quadrant abdominal pain.  No cough, No difficulty breathing.  He has never had COVID before.  He received 3-4 doses of COVID-vaccine.   Dementia documented in problem list, reports this is very mild.  At baseline ambulates with a cane.    PT Comments    Patient presents with his daughter in room for family training.  Patient demonstrates labored movement for sitting up at bedside with frequent leaning backwards once seated, able to transfer to/from chair with verbal/tactile cueing from his daughter for proper hand placement during sit to stands with fair/good carryover and able to ambulate in room using RW without loss of balance.  Patient's daughter demonstrates good return for assisting patient with understanding acknowledged.  Patient tolerated sitting up in chair after therapy and give gait belt for home use.  Patient will benefit from continued skilled physical therapy in hospital and recommended venue below to increase strength, balance, endurance for safe ADLs and gait.    Recommendations for follow up therapy are one component of a multi-disciplinary discharge planning process, led by the attending physician.  Recommendations may be updated based on patient status, additional functional criteria and insurance authorization.  Follow Up Recommendations  Can patient physically be transported by private vehicle: Yes     Assistance Recommended at Discharge Set up Supervision/Assistance  Patient can return home with the following A little help with walking and/or transfers;A lot of help with bathing/dressing/bathroom;Assistance with cooking/housework;Help with stairs or ramp for entrance   Equipment Recommendations  None recommended by PT    Recommendations for Other Services       Precautions / Restrictions Precautions Precautions: Fall Restrictions Weight Bearing Restrictions: No     Mobility  Bed Mobility Overal bed mobility: Needs Assistance Bed Mobility: Supine to Sit     Supine to sit: Mod assist, HOB elevated     General bed mobility comments: increased time, labored movement, frequent posterior leaning once seated    Transfers Overall transfer level: Needs assistance Equipment used: Rolling walker (2 wheels) Transfers: Sit to/from Stand, Bed to chair/wheelchair/BSC Sit to Stand: Mod assist   Step pivot transfers: Mod assist       General transfer comment: required repeated verbal/tactile cueing for scooting to edge of chair and completing sit to stands using RW    Ambulation/Gait Ambulation/Gait assistance: Min assist Gait Distance (Feet): 22 Feet Assistive device: Rolling walker (2 wheels) Gait Pattern/deviations: Decreased step length - left, Decreased stance time - right, Decreased stride length Gait velocity: decreased     General Gait Details: increased endurance/distance for ambulation in room with slow labored unsteady cadence without loss of balance   Stairs             Wheelchair Mobility    Modified Rankin (Stroke Patients Only)       Balance Overall balance assessment: Needs assistance Sitting-balance support: Feet supported, No upper extremity supported Sitting balance-Leahy Scale: Poor  Sitting balance - Comments: fair/poor seated at EOB Postural control: Posterior lean Standing balance support: Reliant on assistive device for balance,  During functional activity, Bilateral upper extremity supported Standing balance-Leahy Scale: Fair Standing balance comment: using RW                            Cognition Arousal/Alertness: Awake/alert Behavior During Therapy: WFL for tasks assessed/performed Overall Cognitive Status: Within Functional Limits for tasks assessed                                          Exercises      General Comments        Pertinent Vitals/Pain Pain Assessment Pain Assessment: No/denies pain    Home Living Family/patient expects to be discharged to:: Private residence Living Arrangements: Children Available Help at Discharge: Family;Available 24 hours/day Type of Home: House Home Access: Stairs to enter Entrance Stairs-Rails: Right;Left;Can reach both Entrance Stairs-Number of Steps: 2   Home Layout: One level Home Equipment: Standard Walker;Cane - single point;Grab bars - tub/shower;Shower seat;BSC/3in1 Additional Comments: Per PT note    Prior Function            PT Goals (current goals can now be found in the care plan section) Acute Rehab PT Goals Patient Stated Goal: return home with family to assist PT Goal Formulation: With patient/family Time For Goal Achievement: 10/25/22 Potential to Achieve Goals: Good Progress towards PT goals: Progressing toward goals    Frequency    Min 3X/week      PT Plan Discharge plan needs to be updated    Co-evaluation PT/OT/SLP Co-Evaluation/Treatment: Yes Reason for Co-Treatment: To address functional/ADL transfers PT goals addressed during session: Mobility/safety with mobility;Balance;Proper use of DME OT goals addressed during session: ADL's and self-care      AM-PAC PT "6 Clicks" Mobility   Outcome Measure  Help needed turning from your back to your side while in a flat bed without using bedrails?: A Lot Help needed moving from lying on your back to sitting on the side of a flat bed without  using bedrails?: A Lot Help needed moving to and from a bed to a chair (including a wheelchair)?: A Little Help needed standing up from a chair using your arms (e.g., wheelchair or bedside chair)?: A Little Help needed to walk in hospital room?: A Little Help needed climbing 3-5 steps with a railing? : A Lot 6 Click Score: 15    End of Session   Activity Tolerance: Patient tolerated treatment well;Patient limited by fatigue Patient left: in chair;with call bell/phone within reach;with family/visitor present Nurse Communication: Mobility status PT Visit Diagnosis: Unsteadiness on feet (R26.81);Other abnormalities of gait and mobility (R26.89);Muscle weakness (generalized) (M62.81)     Time: 1610-9604 PT Time Calculation (min) (ACUTE ONLY): 30 min  Charges:  $Gait Training: 8-22 mins $Therapeutic Activity: 8-22 mins                     11:20 AM, 10/23/22 Ocie Bob, MPT Physical Therapist with [facility] 336 [phone] office [phone] mobile phone

## 2022-10-23 NOTE — Progress Notes (Signed)
Patient requires frequent re-positioning of the body in ways that cannot be achieved with an ordinary bed or wedge pillow, to eliminate pain, reduce pressure, and the head of the bed to be elevated more than 30 degrees most of the time due to osteoarthritis.

## 2022-10-23 NOTE — Care Management Important Message (Signed)
Important Message  Patient Details  Name: Travis [name] MRN: [id number] Date of Birth: [birth year]   Medicare Important Message Given:  N/A - LOS <3 / Initial given by admissions     Corey Harold 10/23/2022, 11:00 AM

## 2022-10-23 NOTE — TOC Transition Note (Addendum)
Transition of Care [facility]) - CM/SW Discharge Note   Patient Details  Name: Travis [name] MRN: [id number] Date of Birth: 02/18/1932  Transition of Care [facility]) CM/SW Contact:  Karn Cassis, LCSW Phone Number: 10/23/2022, 12:06 PM   Clinical Narrative: Pt d/c today. Followed up with pt's daughter for decision on SNF. Family has decided to take pt home. Agreeable to home health and requests [facility]. Morrie Sheldon with [facility] accepts for HHPT/OT and aware of d/c today. Orders in. Pt's daughter also requests rollator and hospital bed. Daughter aware pt may not have qualifying diagnosis for hospital bed but will attempt. Agreeable to [facility]. Referral made and will drop ship DME to home.       Final next level of care: Home w Home Health Services Barriers to Discharge: Barriers Resolved   Patient Goals and CMS Choice CMS Medicare.gov Compare Post Acute Care list provided to:: Patient Represenative (must comment) Choice offered to / list presented to : Adult Children  Discharge Placement                    Name of family member notified: [name] Patient and family notified of of transfer: 10/23/22  Discharge Plan and Services Additional resources added to the After Visit Summary for                  DME Arranged: Walker rolling with seat, Hospital bed DME Agency: [facility] Date DME Agency Contacted: 10/23/22 Time DME Agency Contacted: 1057 Representative spoke with at DME Agency: Mitch HH Arranged: PT, OT HH Agency: [facility] Date HH Agency Contacted: 10/23/22 Time HH Agency Contacted: 1058 Representative spoke with at [facility] Agency: Morrie Sheldon  Social Determinants of Health (SDOH) Interventions SDOH Screenings   Food Insecurity: No Food Insecurity (10/20/2022)  Housing: Low Risk  (10/20/2022)  Transportation Needs: No Transportation Needs (10/20/2022)  Utilities: Not At Risk (10/20/2022)  Tobacco Use: Low Risk  (10/21/2022)     Readmission Risk  Interventions     No data to display

## 2022-10-24 LAB — CULTURE, BLOOD (ROUTINE X 2)

## 2022-10-25 LAB — CULTURE, BLOOD (ROUTINE X 2): Culture: NO GROWTH

## 2022-11-10 NOTE — Progress Notes (Unsigned)
Travis [name] June 10, 1931 [id number]  History of Present Illness: Mr. Paisley is a 89 y.o. male who presents today for follow up visit at [facility] [place]. - GU history: 1. Bladder cancer. - 09/19/2016: Underwent TURBT by Dr. Sherryl Barters. 2. BPH with LUTS (frequency).   At last visit with Dr. Mena Goes on 09/29/2022: The plan was:  - For BPH with LUTS: "Cont gemtesa and finasteride. Refilled." - "H/o bladder cancer - f/u for cystoscopy . Discussed with pt and family."  Since last visit: Admitted 10/20/2022 - 10/23/2022 for sepsis secondary to UTI with gross hematuria / clots.  - CT abdomen/pelvis w/ contrast on 10/20/2022: "Kidneys are normal, without renal calculi, focal lesion, or hydronephrosis. There is moderate severity diffuse urinary bladder wall thickening." - Urine culture positive for E. Coli. Treated with IV ceftriaxone while in the hospital for 2 days; discharged on ***cefadroxil x5 days.  - Gross hematuria resolved prior to discharge.  Today: He reports ***  He reports *** urinary stream. He {Actions; denies-reports:120008} urinary hesitancy, urgency, frequency, dysuria, gross hematuria, straining to void, or sensations of incomplete emptying.  He reports *** UTls in past 12 months. When present, UTI symptoms include ***increased urinary urgency, frequency, dysuria, ***.  He {Actions; denies-reports:120008} flank pain. He {Actions; denies-reports:120008} abdominal pain. He {Actions; denies-reports:120008} fevers. He {Actions; denies-reports:120008} nausea/ vomiting.   Fall Screening: Do you usually have a device to assist in your mobility? {yes/no:20286} ***cane / ***walker / ***wheelchair   Medications: Current Outpatient Medications  Medication Sig Dispense Refill   acetaminophen (TYLENOL) 650 MG CR tablet Take 1,300 mg by mouth daily as needed for pain.     ascorbic acid (VITAMIN C) 500 MG tablet Take 1 tablet (500 mg total) by mouth daily. 30 tablet 0    cetirizine (ZYRTEC) 10 MG tablet Take 10 mg by mouth daily.     finasteride (PROSCAR) 5 MG tablet Take 1 tablet (5 mg total) by mouth daily. 90 tablet 3   pantoprazole (PROTONIX) 40 MG tablet Take 40 mg by mouth daily.     Vibegron (GEMTESA) 75 MG TABS Take 1 tablet (75 mg total) by mouth daily. 30 tablet 11   vitamin B-12 (CYANOCOBALAMIN) 1000 MCG tablet Take 1,000 mcg by mouth daily.      VITAMIN D PO Take 1 tablet by mouth daily.     zinc sulfate 220 (50 Zn) MG capsule Take 1 capsule (220 mg total) by mouth daily. 30 capsule 0   No current facility-administered medications for this visit.    Allergies: No Known Allergies  Past Medical History:  Diagnosis Date   Arthritis    Cancer (HCC)    BLADDER   Complication of anesthesia    had a hard time recovering from anesthesia after knee replacement neuro symptoms   Dementia (HCC)    Hyperlipidemia    Hypertension    Parkinson disease    Past Surgical History:  Procedure Laterality Date   eye implants  1992   EYE SURGERY Bilateral    Cataract Extraction with IOL Implants   HERNIA REPAIR Right 1992   Inguinal Hernia Repair   INGUINAL HERNIA REPAIR Left 01/03/2016   Procedure: LAPAROSCOPIC INGUINAL HERNIA;  Surgeon: Leafy Ro, MD;  Location: [facility] ORS;  Service: General;  Laterality: Left;   INGUINAL HERNIA REPAIR Left 06/01/2019   Procedure: HERNIA REPAIR INGUINAL ADULT;  Surgeon: Carolan Shiver, MD;  Location: [facility] ORS;  Service: General;  Laterality: Left;   JOINT REPLACEMENT  TOTAL KNEE ARTHROPLASTY Left 03/18/2016   Procedure: TOTAL KNEE ARTHROPLASTY;  Surgeon: Kennedy Bucker, MD;  Location: [facility] ORS;  Service: Orthopedics;  Laterality: Left;   TRANSURETHRAL RESECTION OF BLADDER TUMOR N/A 09/19/2016   Procedure: TRANSURETHRAL RESECTION OF BLADDER TUMOR (TURBT) MEDIUM;  Surgeon: Hildred Laser, MD;  Location: [facility] ORS;  Service: Urology;  Laterality: N/A;   Family History  Problem Relation Age of Onset    Stroke Father    Diabetes Sister    Breast cancer Sister    Cancer Maternal Aunt    Hematuria Neg Hx    Renal cancer Neg Hx    Prostate cancer Neg Hx    Bladder Cancer Neg Hx    Social History   Socioeconomic History   Marital status: Widowed    Spouse name: Not on file   Number of children: Not on file   Years of education: Not on file   Highest education level: Not on file  Occupational History   Not on file  Tobacco Use   Smoking status: Never   Smokeless tobacco: Never  Vaping Use   Vaping Use: Never used  Substance and Sexual Activity   Alcohol use: No   Drug use: No   Sexual activity: Not Currently  Other Topics Concern   Not on file  Social History Narrative   Not on file   Social Determinants of Health   Financial Resource Strain: Not on file  Food Insecurity: No Food Insecurity (10/20/2022)   Hunger Vital Sign    Worried About Running Out of Food in the Last Year: Never true    Ran Out of Food in the Last Year: Never true  Transportation Needs: No Transportation Needs (10/20/2022)   PRAPARE - Administrator, Civil Service (Medical): No    Lack of Transportation (Non-Medical): No  Physical Activity: Not on file  Stress: Not on file  Social Connections: Not on file  Intimate Partner Violence: Not At Risk (10/20/2022)   Humiliation, Afraid, Rape, and Kick questionnaire    Fear of Current or Ex-Partner: No    Emotionally Abused: No    Physically Abused: No    Sexually Abused: No    Review of Systems Constitutional: Patient ***denies any unintentional weight loss or change in strength lntegumentary: Patient ***denies any rashes or pruritus Eyes: Patient denies ***dry eyes ENT: Patient ***denies dry mouth Cardiovascular: Patient ***denies chest pain or syncope Respiratory: Patient ***denies shortness of breath Gastrointestinal: Patient ***denies nausea, vomiting, constipation, or diarrhea Musculoskeletal: Patient ***denies muscle cramps or  weakness Neurologic: Patient ***denies convulsions or seizures Psychiatric: Patient ***denies memory problems Allergic/Immunologic: Patient ***denies recent allergic reaction(s) Hematologic/Lymphatic: Patient denies bleeding tendencies Endocrine: Patient ***denies heat/cold intolerance  GU: As per HPI.  OBJECTIVE There were no vitals filed for this visit. There is no height or weight on file to calculate BMI.  Physical Examination  Constitutional: ***No obvious distress; patient is ***non-toxic appearing  Cardiovascular: ***No visible lower extremity edema.  Respiratory: The patient does ***not have audible wheezing/stridor; respirations do ***not appear labored  Gastrointestinal: Abdomen ***non-distended Musculoskeletal: ***Normal ROM of UEs  Skin: ***No obvious rashes/open sores  Neurologic: CN 2-12 grossly ***intact Psychiatric: Answered questions ***appropriately with ***normal affect  Hematologic/Lymphatic/Immunologic: ***No obvious bruises or sites of spontaneous bleeding  UA: {Desc; negative/positive:13464} *** WBC/hpf, *** RBC/hpf, bacteria (***) *** nitrites, *** leukocytes, *** blood PVR: *** ml  ASSESSMENT Gross hematuria ***We reviewed history and current situation in detail. We discussed possible etiologies for  his gross hematuria include but not limited to: vigorous exercise, sexual activity, stone, trauma, blood thinner use, urinary tract infection, kidney function, BPH, prostatitis, radiation cystitis, malignancy.   He was advised to ***follow up with Dr. Mena Goes as scheduled on 11/24/2022 for cystoscopy for further evaluation.  We discussed the risk for clot retention and pt was advised to increase fluid intake to thin out clots. Pt was advised to go to the ER if they become unable to urinate due to clot retention, start having symptoms of anemia (which were discussed), or any other significant concerning acute symptoms.  Pt verbalized understanding and  agreement. All questions were answered.   PLAN Advised the following: 1. ***Maintain adequate hydration.  2. Continue ***Gemtesa and Finasteride as prescribed. 3. ***No follow-ups on file.  No orders of the defined types were placed in this encounter.   It has been explained that the patient is to follow regularly with their PCP in addition to all other providers involved in their care and to follow instructions provided by these respective offices. Patient advised to contact urology clinic if any urologic-pertaining questions, concerns, new symptoms or problems arise in the interim period.  There are no Patient Instructions on file for this visit.  Electronically signed by:  Donnita Falls, FNP   11/10/22    6:00 PM

## 2022-11-11 ENCOUNTER — Inpatient Hospital Stay: Payer: Medicare Other | Attending: Hematology | Admitting: Hematology

## 2022-11-11 ENCOUNTER — Ambulatory Visit: Payer: Medicare Other | Admitting: Urology

## 2022-11-11 ENCOUNTER — Encounter: Payer: Self-pay | Admitting: Urology

## 2022-11-11 VITALS — BP 144/78 | HR 56 | Temp 97.9°F

## 2022-11-11 DIAGNOSIS — Z8551 Personal history of malignant neoplasm of bladder: Secondary | ICD-10-CM | POA: Diagnosis not present

## 2022-11-11 DIAGNOSIS — C679 Malignant neoplasm of bladder, unspecified: Secondary | ICD-10-CM

## 2022-11-11 DIAGNOSIS — Z09 Encounter for follow-up examination after completed treatment for conditions other than malignant neoplasm: Secondary | ICD-10-CM

## 2022-11-11 DIAGNOSIS — N138 Other obstructive and reflux uropathy: Secondary | ICD-10-CM | POA: Diagnosis not present

## 2022-11-11 DIAGNOSIS — R8290 Unspecified abnormal findings in urine: Secondary | ICD-10-CM | POA: Diagnosis not present

## 2022-11-11 DIAGNOSIS — N401 Enlarged prostate with lower urinary tract symptoms: Secondary | ICD-10-CM

## 2022-11-11 DIAGNOSIS — R310 Gross hematuria: Secondary | ICD-10-CM

## 2022-11-11 DIAGNOSIS — R399 Unspecified symptoms and signs involving the genitourinary system: Secondary | ICD-10-CM

## 2022-11-11 DIAGNOSIS — N3001 Acute cystitis with hematuria: Secondary | ICD-10-CM

## 2022-11-11 DIAGNOSIS — Z87440 Personal history of urinary (tract) infections: Secondary | ICD-10-CM

## 2022-11-11 LAB — BLADDER SCAN AMB NON-IMAGING: Scan Result: 0

## 2022-11-11 LAB — URINALYSIS, ROUTINE W REFLEX MICROSCOPIC
Bilirubin, UA: NEGATIVE
Glucose, UA: NEGATIVE
Ketones, UA: NEGATIVE
Nitrite, UA: NEGATIVE
Protein,UA: NEGATIVE
Specific Gravity, UA: 1.02 (ref 1.005–1.030)
Urobilinogen, Ur: 2 mg/dL — ABNORMAL HIGH (ref 0.2–1.0)
pH, UA: 5.5 (ref 5.0–7.5)

## 2022-11-11 LAB — MICROSCOPIC EXAMINATION: WBC, UA: >30 /hpf — AB (ref 0–5)

## 2022-11-11 MED ORDER — NITROFURANTOIN MONOHYD MACRO 100 MG PO CAPS
100.0000 mg | ORAL_CAPSULE | Freq: Two times a day (BID) | ORAL | 0 refills | Status: AC
Start: 2022-11-11 — End: 2022-11-21

## 2022-11-11 NOTE — Progress Notes (Signed)
Uroflow  Peak Flow: 12ml Average Flow: 12ml Voided Volume: 72ml Voiding Time: 10sec Flow Time: 10sec Time to Peak Flow: 3sec  PVR Volume: 0 ml   Pt here today for bladder scan. Bladder was scanned and 0 was visualized.   Performed by Kennyth Lose, CMA

## 2022-11-24 ENCOUNTER — Ambulatory Visit: Payer: Medicare Other | Admitting: Urology

## 2022-11-24 ENCOUNTER — Encounter: Payer: Self-pay | Admitting: Urology

## 2022-11-24 VITALS — BP 132/74 | HR 64

## 2022-11-24 DIAGNOSIS — N401 Enlarged prostate with lower urinary tract symptoms: Secondary | ICD-10-CM

## 2022-11-24 DIAGNOSIS — N138 Other obstructive and reflux uropathy: Secondary | ICD-10-CM | POA: Diagnosis not present

## 2022-11-24 DIAGNOSIS — R310 Gross hematuria: Secondary | ICD-10-CM

## 2022-11-24 LAB — MICROSCOPIC EXAMINATION: Bacteria, UA: NONE SEEN

## 2022-11-24 LAB — URINALYSIS, ROUTINE W REFLEX MICROSCOPIC
Bilirubin, UA: NEGATIVE
Glucose, UA: NEGATIVE
Ketones, UA: NEGATIVE
Leukocytes,UA: NEGATIVE
Nitrite, UA: NEGATIVE
Protein,UA: NEGATIVE
Specific Gravity, UA: 1.02 (ref 1.005–1.030)
Urobilinogen, Ur: 1 mg/dL (ref 0.2–1.0)
pH, UA: 5.5 (ref 5.0–7.5)

## 2022-11-24 MED ORDER — TAMSULOSIN HCL 0.4 MG PO CAPS
0.4000 mg | ORAL_CAPSULE | Freq: Every day | ORAL | 3 refills | Status: DC
Start: 2022-11-24 — End: 2024-02-11

## 2022-11-24 MED ORDER — CIPROFLOXACIN HCL 500 MG PO TABS
500.0000 mg | ORAL_TABLET | Freq: Once | ORAL | Status: DC
Start: 2022-11-24 — End: 2023-08-23

## 2022-11-24 NOTE — Progress Notes (Unsigned)
11/24/2022 2:38 PM   Travis [name] 02-01-1932 [id number]  Referring provider: Lauro Regulus, MD [address] [facility] [place] [facility] [place],  [place] [address]  No chief complaint on file.   HPI:  F/u -    1) h/o High risk bladder ca - TURBT 09/2016 with Dr. Sherryl Barters for large >6cm HG Ta UCC right bladder tumor.    Imaging/staging: -Mar 2020 CT a/p - benign  -Apr 2023 CT a/p - benign, BPH    Intravesical tx: -2018 BCG induction x 6 -2019 BCG maintenance - declined further BCG (in part due to COVID in 2020).     2) BPH - CT scan in 2013 with prostate enlargement noted (~100g). He had frequency and urgency and was started on oxybutynin in 2019. Symptoms returned when he ran out of oxybutynin. He has urgency and occ UUI. He does have some confusion and constipation. No gross hematuria. Gemtesa working well. His stream is good. Noc x 1. Daytime frequency and urgency.   He started finasteride Mar 2023 for BPH and LUTS. Inc freq/urge in Oct 2023. Rx for Broussard given. He is taking it. Noc x 1- 2 -3-4. He uses a urinal. They tried to go QOD but nocturia and frequency worse.    Today, seen for the above for cysto and management of BPH. He was admitted Jun 2024 with fever. CXR and CT benign. BPH present. Urine grew e coli. Tx with cafadroxil and nitrofurantoin. He is well and has no dysuria. UA clear.   He is on finasteride.     PMH: Past Medical History:  Diagnosis Date   Arthritis    Cancer (HCC)    BLADDER   Complication of anesthesia    had a hard time recovering from anesthesia after knee replacement neuro symptoms   Dementia (HCC)    Hyperlipidemia    Parkinson disease     Surgical History: Past Surgical History:  Procedure Laterality Date   eye implants  1992   EYE SURGERY Bilateral    Cataract Extraction with IOL Implants   HERNIA REPAIR Right 1992   Inguinal Hernia Repair   INGUINAL HERNIA REPAIR Left 01/03/2016   Procedure: LAPAROSCOPIC  INGUINAL HERNIA;  Surgeon: Leafy Ro, MD;  Location: [facility] ORS;  Service: General;  Laterality: Left;   INGUINAL HERNIA REPAIR Left 06/01/2019   Procedure: HERNIA REPAIR INGUINAL ADULT;  Surgeon: Carolan Shiver, MD;  Location: [facility] OR[facility];  Service: General;  Laterality: Left;   JOINT REPLACEMENT     TOTAL KNEE ARTHROPLASTY Left 03/18/2016   Procedure: TOTAL KNEE ARTHROPLASTY;  Surgeon: Kennedy Bucker, MD;  Location: [facility] OR[facility];  Service: Orthopedics;  Laterality: Left;   TRANSURETHRAL RESECTION OF BLADDER TUMOR N/A 09/19/2016   Procedure: TRANSURETHRAL RESECTION OF BLADDER TUMOR (TURBT) MEDIUM;  Surgeon: Hildred Laser, MD;  Location: [facility] ORS;  Service: Urology;  Laterality: N/A;    Home Medications:  Allergies as of 11/24/2022   No Known Allergies      Medication List        Accurate as of November 24, 2022  2:38 PM. If you have any questions, ask your nurse or doctor.          acetaminophen 650 MG CR tablet Commonly known as: TYLENOL Take 1,300 mg by mouth daily as needed for pain.   ascorbic acid 500 MG tablet Commonly known as: VITAMIN C Take 1 tablet (500 mg total) by mouth daily.   cetirizine 10 MG tablet Commonly  known as: ZYRTEC Take 10 mg by mouth daily.   cyanocobalamin 1000 MCG tablet Commonly known as: VITAMIN B12 Take 1,000 mcg by mouth daily.   finasteride 5 MG tablet Commonly known as: PROSCAR Take 1 tablet (5 mg total) by mouth daily.   furosemide 20 MG tablet Commonly known as: LASIX Take 20 mg by mouth daily.   Gemtesa 75 MG Tabs Generic drug: Vibegron Take 1 tablet (75 mg total) by mouth daily.   pantoprazole 40 MG tablet Commonly known as: PROTONIX Take 40 mg by mouth daily.   VITAMIN D PO Take 1 tablet by mouth daily.   zinc sulfate 220 (50 Zn) MG capsule Take 1 capsule (220 mg total) by mouth daily.        Allergies: No Known Allergies  Family History: Family History  Problem Relation Age of Onset   Stroke Father     Diabetes Sister    Breast cancer Sister    Cancer Maternal Aunt    Hematuria Neg Hx    Renal cancer Neg Hx    Prostate cancer Neg Hx    Bladder Cancer Neg Hx     Social History:  reports that he has never smoked. He has never used smokeless tobacco. He reports that he does not drink alcohol and does not use drugs.   Physical Exam: BP 132/74   Pulse 64   Constitutional:  Alert and oriented, No acute distress. HEENT: [place] AT, moist mucus membranes.  Trachea midline, no masses. Cardiovascular: No clubbing, cyanosis, or edema. Respiratory: Normal respiratory effort, no increased work of breathing. GI: Abdomen is soft, nontender, nondistended, no abdominal masses GU: No CVA tenderness Skin: No rashes, bruises or suspicious lesions. Neurologic: Grossly intact, no focal deficits, moving all 4 extremities. Psychiatric: Normal mood and affect.   PROCEDURE: Cystoscopy Procedure Note  Patient identification was confirmed, informed consent was obtained, and patient was prepped using Betadine solution.  Lidocaine jelly was administered per urethral meatus.     Pre-Procedure: - Inspection reveals a normal caliber ureteral meatus.  Procedure: The flexible cystoscope was introduced without difficulty - No urethral strictures/lesions are present. - moderate prostate enlargement with BPH. No median lobe.  - normal bladder neck - Bilateral ureteral orifices identified - Bladder mucosa  reveals no ulcers, tumors, or lesions - No bladder stones - No trabeculation  Retroflexion shows normal bladder and BN. About a 1 cm protrusion of prostate into bladder.    Post-Procedure: - Patient tolerated the procedure well    Laboratory Data: Lab Results  Component Value Date   WBC 6.1 10/22/2022   HGB 11.0 (L) 10/22/2022   HCT 34.9 (L) 10/22/2022   MCV 79.5 (L) 10/22/2022   PLT 190 10/22/2022    Lab Results  Component Value Date   CREATININE 0.85 10/22/2022    No results found for:  "PSA"  No results found for: "TESTOSTERONE"  No results found for: "HGBA1C"  Urinalysis    Component Value Date/Time   COLORURINE YELLOW 10/20/2022 1629   APPEARANCEUR Clear 11/11/2022 0847   LABSPEC 1.014 10/20/2022 1629   PHURINE 5.0 10/20/2022 1629   GLUCOSEU Negative 11/11/2022 0847   HGBUR MODERATE (A) 10/20/2022 1629   BILIRUBINUR Negative 11/11/2022 0847   KETONESUR NEGATIVE 10/20/2022 1629   PROTEINUR Negative 11/11/2022 0847   PROTEINUR 30 (A) 10/20/2022 1629   NITRITE Negative 11/11/2022 0847   NITRITE NEGATIVE 10/20/2022 1629   LEUKOCYTESUR 3+ (A) 11/11/2022 0847   LEUKOCYTESUR MODERATE (A) 10/20/2022 1629  Lab Results  Component Value Date   LABMICR See below: 11/11/2022   WBCUA >30 (A) 11/11/2022   RBCUA None seen 03/12/2018   LABEPIT 0-10 11/11/2022   MUCUS Present (A) 03/06/2022   BACTERIA Moderate (A) 11/11/2022    Pertinent Imaging: N/a    Assessment & Plan:    1. Gross hematuria Benign evaluation. Likely related to UTI.  - Urinalysis, Routine w reflex microscopic - Cystoscopy - ciprofloxacin (CIPRO) tablet 500 mg  2. BPH with obstruction/lower urinary tract symptoms Disc with pt and family nature r/b/a to adding tamsulosin and he will start.  - Urinalysis, Routine w reflex microscopic - Cystoscopy - ciprofloxacin (CIPRO) tablet 500 mg   No follow-ups on file.  Jerilee Field, MD  [facility]  [address] [place], [place] [address] [phone]

## 2022-12-19 ENCOUNTER — Emergency Department (HOSPITAL_COMMUNITY): Payer: Medicare Other

## 2022-12-19 ENCOUNTER — Other Ambulatory Visit: Payer: Self-pay

## 2022-12-19 ENCOUNTER — Telehealth: Payer: Self-pay

## 2022-12-19 ENCOUNTER — Encounter (HOSPITAL_COMMUNITY): Payer: Self-pay

## 2022-12-19 ENCOUNTER — Emergency Department (HOSPITAL_COMMUNITY)
Admission: EM | Admit: 2022-12-19 | Discharge: 2022-12-20 | Disposition: A | Discharge status: Home / Self Care | Payer: Medicare Other | Attending: Emergency Medicine | Admitting: Emergency Medicine

## 2022-12-19 DIAGNOSIS — R4182 Altered mental status, unspecified: Secondary | ICD-10-CM | POA: Diagnosis not present

## 2022-12-19 DIAGNOSIS — Z8551 Personal history of malignant neoplasm of bladder: Secondary | ICD-10-CM | POA: Insufficient documentation

## 2022-12-19 DIAGNOSIS — I10 Essential (primary) hypertension: Secondary | ICD-10-CM | POA: Insufficient documentation

## 2022-12-19 DIAGNOSIS — F0390 Unspecified dementia without behavioral disturbance: Secondary | ICD-10-CM | POA: Diagnosis not present

## 2022-12-19 DIAGNOSIS — G20A1 Parkinson's disease without dyskinesia, without mention of fluctuations: Secondary | ICD-10-CM | POA: Insufficient documentation

## 2022-12-19 DIAGNOSIS — G319 Degenerative disease of nervous system, unspecified: Secondary | ICD-10-CM | POA: Diagnosis not present

## 2022-12-19 DIAGNOSIS — R531 Weakness: Secondary | ICD-10-CM | POA: Diagnosis present

## 2022-12-19 DIAGNOSIS — I6782 Cerebral ischemia: Secondary | ICD-10-CM | POA: Diagnosis not present

## 2022-12-19 LAB — DIFFERENTIAL
Abs Immature Granulocytes: 0.01 10*3/uL (ref 0.00–0.07)
Basophils Absolute: 0 10*3/uL (ref 0.0–0.1)
Basophils Relative: 0 %
Eosinophils Absolute: 0.1 10*3/uL (ref 0.0–0.5)
Eosinophils Relative: 2 %
Immature Granulocytes: 0 %
Lymphocytes Relative: 36 %
Lymphs Abs: 1.9 10*3/uL (ref 0.7–4.0)
Monocytes Absolute: 0.5 10*3/uL (ref 0.1–1.0)
Monocytes Relative: 9 %
Neutro Abs: 2.6 10*3/uL (ref 1.7–7.7)
Neutrophils Relative %: 53 %

## 2022-12-19 LAB — COMPREHENSIVE METABOLIC PANEL
ALT: 23 U/L (ref 0–44)
AST: 37 U/L (ref 15–41)
Albumin: 3.7 g/dL (ref 3.5–5.0)
Alkaline Phosphatase: 91 U/L (ref 38–126)
Anion gap: 8 (ref 5–15)
BUN: 18 mg/dL (ref 8–23)
CO2: 27 mmol/L (ref 22–32)
Calcium: 9.4 mg/dL (ref 8.9–10.3)
Chloride: 102 mmol/L (ref 98–111)
Creatinine, Ser: 1.14 mg/dL (ref 0.61–1.24)
GFR, Estimated: >60 mL/min (ref >60)
Glucose, Bld: 91 mg/dL (ref 70–99)
Potassium: 3.9 mmol/L (ref 3.5–5.1)
Sodium: 137 mmol/L (ref 135–145)
Total Bilirubin: 0.6 mg/dL (ref 0.3–1.2)
Total Protein: 6.6 g/dL (ref 6.5–8.1)

## 2022-12-19 LAB — CBC
HCT: 37.1 % — ABNORMAL LOW (ref 39.0–52.0)
Hemoglobin: 11.3 g/dL — ABNORMAL LOW (ref 13.0–17.0)
MCH: 23.7 pg — ABNORMAL LOW (ref 26.0–34.0)
MCHC: 30.5 g/dL (ref 30.0–36.0)
MCV: 77.9 fL — ABNORMAL LOW (ref 80.0–100.0)
Platelets: 226 10*3/uL (ref 150–400)
RBC: 4.76 MIL/uL (ref 4.22–5.81)
RDW: 16.1 % — ABNORMAL HIGH (ref 11.5–15.5)
WBC: 5.1 10*3/uL (ref 4.0–10.5)
nRBC: 0 % (ref 0.0–0.2)

## 2022-12-19 LAB — PROTIME-INR
INR: 1.1 (ref 0.8–1.2)
Prothrombin Time: 14.6 seconds (ref 11.4–15.2)

## 2022-12-19 LAB — I-STAT CHEM 8, ED
BUN: 18 mg/dL (ref 8–23)
Calcium, Ion: 1.21 mmol/L (ref 1.15–1.40)
Chloride: 103 mmol/L (ref 98–111)
Creatinine, Ser: 1.2 mg/dL (ref 0.61–1.24)
Glucose, Bld: 86 mg/dL (ref 70–99)
HCT: 38 % — ABNORMAL LOW (ref 39.0–52.0)
Hemoglobin: 12.9 g/dL — ABNORMAL LOW (ref 13.0–17.0)
Potassium: 3.9 mmol/L (ref 3.5–5.1)
Sodium: 139 mmol/L (ref 135–145)
TCO2: 26 mmol/L (ref 22–32)

## 2022-12-19 LAB — APTT: aPTT: 31 seconds (ref 24–36)

## 2022-12-19 LAB — CBG MONITORING, ED: Glucose-Capillary: 96 mg/dL (ref 70–99)

## 2022-12-19 LAB — ETHANOL: Alcohol, Ethyl (B): <10 mg/dL (ref <10)

## 2022-12-19 MED ORDER — SODIUM CHLORIDE 0.9 % IV SOLN
1.0000 g | Freq: Once | INTRAVENOUS | Status: AC
  Administered 2022-12-19: 1 g via INTRAVENOUS
  Filled 2022-12-19: qty 10

## 2022-12-19 MED ORDER — SODIUM CHLORIDE 0.9 % IV BOLUS
500.0000 mL | Freq: Once | INTRAVENOUS | Status: AC
  Administered 2022-12-19: 500 mL via INTRAVENOUS

## 2022-12-19 MED ORDER — SODIUM CHLORIDE 0.9% FLUSH: 3.0000 mL | Freq: Once | INTRAVENOUS | Status: DC

## 2022-12-19 NOTE — ED Triage Notes (Signed)
Pt presents with L side facial droop, L side weakness, and fatigue x 2 weeks was LKW. Pt is A/Ox4 during triage.

## 2022-12-19 NOTE — ED Notes (Addendum)
Went to perform in and out cath, but family with patient advised that they would rather him try and void, instead of cath him. Patient family given urinal, and they advised that they would inform us when he voided.

## 2022-12-19 NOTE — ED Provider Notes (Signed)
[place] EMERGENCY DEPARTMENT AT [facility] Provider Note  MDM   HPI/ROS:  Travis [name] is a 89 y.o. male with past medical history of BPH, hypertension, bladder cancer, Parkinson's disease presenting out of concern for acute stroke.  History per patient's daughter.  Patient's daughter reports that family has noticed left-sided facial droop and left-sided hemibody weakness over the past few days.  He normally ambulates with a walker, but has been dragging his left side as in the past 2 days.  She reports that he has been mentating normally but has seemed a little more tired as of recently.  Patient reportedly has frequent urinary tract infections, but has not experienced dysuria, urinary frequency, urgency, or urinary retention as of recently.  Physical exam is notable for: - Chronically ill-appearing - Mild left-sided facial droop - No significant focal weakness on neurologic exam.  Sensation equal in bilateral upper and lower extremities.  Strength 2 out of 5 in bilateral upper extremities, 1 out of 5 in bilateral lower extremities  On my initial evaluation, patient is:  -Vital signs stable. Patient afebrile, hemodynamically stable, and non-toxic appearing. -Additional history obtained from daughter at bedside, chart reviewed  Given the patient's history and physical exam, differential diagnosis includes but is not limited to CVA, intracranial hemorrhage, urinary tract infection, toxic metabolic disturbance, progression of Parkinson disease/dementia, etc.  Interpretations, interventions, and the patient's course of care are documented below.    Broad toxic metabolic workup initiated.  EKG normal sinus rhythm with no ischemic change and occasional PVCs.  Laboratory workup overall unremarkable.  No leukocytosis, and baseline hemoglobin, normal renal function and electrolytes.  In-N-Out cath performed to obtain urine.  Per patient's daughter, requires dose of antibiotics after  receiving In-N-Out cath.  Rocephin administered.  Fluid bolus administered.  Head CT resulted with no acute intracranial abnormalities.  Given unclear timeline of symptoms with concern for acute stroke and neurological exam with left-sided facial droop, ongoing concern for CVA.  MRI MRA ordered to further evaluate.  Care of this patient was assumed by oncoming team providers at time of signout at approximately midnight.  Plan at time of signout to follow-up MR and urine, determine disposition.  Disposition:  Handoff  Clinical Impression: No diagnosis found.  Rx / DC Orders ED Discharge Orders     None       The plan for this patient was discussed with Dr. Silverio Lay, who voiced agreement and who oversaw evaluation and treatment of this patient.   Clinical Complexity A medically appropriate history, review of systems, and physical exam was performed.  My independent interpretations of EKG, labs, and radiology are documented in the ED course above.   Click here for ABCD2, HEART and other calculatorsREFRESH Note before signing   Patient's presentation is most consistent with acute presentation with potential threat to life or bodily function.  Medical Decision Making Amount and/or Complexity of Data Reviewed Labs: ordered. Radiology: ordered.    HPI/ROS      See MDM section for pertinent HPI and ROS. A complete ROS was performed with pertinent positives/negatives noted above.   Past Medical History:  Diagnosis Date   Arthritis    Cancer (HCC)    BLADDER   Complication of anesthesia    had a hard time recovering from anesthesia after knee replacement neuro symptoms   Dementia (HCC)    Hyperlipidemia    Parkinson disease     Past Surgical History:  Procedure Laterality Date   eye implants  1992   EYE SURGERY Bilateral    Cataract Extraction with IOL Implants   HERNIA REPAIR Right 1992   Inguinal Hernia Repair   INGUINAL HERNIA REPAIR Left 01/03/2016   Procedure:  LAPAROSCOPIC INGUINAL HERNIA;  Surgeon: Leafy Ro, MD;  Location: [facility] ORS;  Service: General;  Laterality: Left;   INGUINAL HERNIA REPAIR Left 06/01/2019   Procedure: HERNIA REPAIR INGUINAL ADULT;  Surgeon: Carolan Shiver, MD;  Location: [facility] ORS;  Service: General;  Laterality: Left;   JOINT REPLACEMENT     TOTAL KNEE ARTHROPLASTY Left 03/18/2016   Procedure: TOTAL KNEE ARTHROPLASTY;  Surgeon: Kennedy Bucker, MD;  Location: [facility] ORS;  Service: Orthopedics;  Laterality: Left;   TRANSURETHRAL RESECTION OF BLADDER TUMOR N/A 09/19/2016   Procedure: TRANSURETHRAL RESECTION OF BLADDER TUMOR (TURBT) MEDIUM;  Surgeon: Hildred Laser, MD;  Location: [facility] ORS;  Service: Urology;  Laterality: N/A;      Physical Exam   Vitals:   12/19/22 2300 12/19/22 2315 12/19/22 2330 12/20/22 0000  BP: 114/69  116/79 126/80  Pulse: (!) 55  (!) 51 (!) 54  Resp: 19  19 20   Temp:  99 F (37.2 C)    TempSrc:  Oral    SpO2: 100%  100% 100%  Weight:      Height:        Physical Exam Vitals and nursing note reviewed.  Constitutional:      General: He is not in acute distress.    Appearance: He is well-developed.  HENT:     Head: Normocephalic and atraumatic.  Eyes:     Conjunctiva/sclera: Conjunctivae normal.  Cardiovascular:     Rate and Rhythm: Normal rate and regular rhythm.     Heart sounds: No murmur heard. Pulmonary:     Effort: Pulmonary effort is normal. No respiratory distress.     Breath sounds: Normal breath sounds.  Abdominal:     Palpations: Abdomen is soft.     Tenderness: There is no abdominal tenderness.  Musculoskeletal:        General: No swelling.     Cervical back: Neck supple.  Skin:    General: Skin is warm and dry.     Capillary Refill: Capillary refill takes less than 2 seconds.  Neurological:     Mental Status: He is alert and oriented to person, place, and time. Mental status is at baseline.     Cranial Nerves: Cranial nerve deficit present.     Sensory: No  sensory deficit.     Motor: No weakness.  Psychiatric:        Mood and Affect: Mood normal.    Starleen Arms, MD Department of Emergency Medicine   Please note that this documentation was produced with the assistance of voice-to-text technology and may contain errors.    Dyanne Iha, MD 12/20/22 Georgiann Mohs    Charlynne Pander, MD 12/20/22 [phone]

## 2022-12-19 NOTE — Telephone Encounter (Signed)
Patient/daughter called in today and states that patient has a foul odor when he urinate and patient is having discomfort in the lower abdomen. Patient/daughter is advised to increase water intake to 1 to 2 litters a day and take over the counter AZO for discomfort for 3 days to see if that will help. Patient/daughter is advised to contact office on Monday if symptoms do not resolve to do a urine drop off. Patient/daughter voiced understanding.

## 2022-12-20 ENCOUNTER — Emergency Department (HOSPITAL_COMMUNITY): Payer: Medicare Other

## 2022-12-20 NOTE — ED Provider Notes (Signed)
1:20 AM Patient care assumed at shift change from MD Laurenzo pending MRI, MRA and urinalysis.  I have reviewed the patient's imaging which shows no acute intracranial abnormality or evidence of CVA.  Urinalysis is clear.  These results have been communicated to the daughter who verbalizes understanding.  She is comfortable taking the patient home.  Return precautions discussed and provided.  Patient discharged in stable condition with no unaddressed concerns.   Results for orders placed or performed during the hospital encounter of 12/19/22  CBC  Result Value Ref Range   WBC 5.1 4.0 - 10.5 K/uL   RBC 4.76 4.22 - 5.81 MIL/uL   Hemoglobin 11.3 (L) 13.0 - 17.0 g/dL   HCT 16.1 (L) 09.6 - 04.5 %   MCV 77.9 (L) 80.0 - 100.0 fL   MCH 23.7 (L) 26.0 - 34.0 pg   MCHC 30.5 30.0 - 36.0 g/dL   RDW 40.9 (H) 81.1 - 91.4 %   Platelets 226 150 - 400 K/uL   nRBC 0.0 0.0 - 0.2 %  Differential  Result Value Ref Range   Neutrophils Relative % 53 %   Neutro Abs 2.6 1.7 - 7.7 K/uL   Lymphocytes Relative 36 %   Lymphs Abs 1.9 0.7 - 4.0 K/uL   Monocytes Relative 9 %   Monocytes Absolute 0.5 0.1 - 1.0 K/uL   Eosinophils Relative 2 %   Eosinophils Absolute 0.1 0.0 - 0.5 K/uL   Basophils Relative 0 %   Basophils Absolute 0.0 0.0 - 0.1 K/uL   Immature Granulocytes 0 %   Abs Immature Granulocytes 0.01 0.00 - 0.07 K/uL  Comprehensive metabolic panel  Result Value Ref Range   Sodium 137 135 - 145 mmol/L   Potassium 3.9 3.5 - 5.1 mmol/L   Chloride 102 98 - 111 mmol/L   CO2 27 22 - 32 mmol/L   Glucose, Bld 91 70 - 99 mg/dL   BUN 18 8 - 23 mg/dL   Creatinine, Ser 7.82 0.61 - 1.24 mg/dL   Calcium 9.4 8.9 - 95.6 mg/dL   Total Protein 6.6 6.5 - 8.1 g/dL   Albumin 3.7 3.5 - 5.0 g/dL   AST 37 15 - 41 U/L   ALT 23 0 - 44 U/L   Alkaline Phosphatase 91 38 - 126 U/L   Total Bilirubin 0.6 0.3 - 1.2 mg/dL   GFR, Estimated >21 >30 mL/min   Anion gap 8 5 - 15  Ethanol  Result Value Ref Range   Alcohol, Ethyl  (B) <10 <10 mg/dL  Protime-INR  Result Value Ref Range   Prothrombin Time 14.6 11.4 - 15.2 seconds   INR 1.1 0.8 - 1.2  APTT  Result Value Ref Range   aPTT 31 24 - 36 seconds  Urinalysis, w/ Reflex to Culture (Infection Suspected) -Urine, Clean Catch  Result Value Ref Range   Specimen Source URINE, CLEAN CATCH    Color, Urine YELLOW YELLOW   APPearance CLEAR CLEAR   Specific Gravity, Urine 1.021 1.005 - 1.030   pH 5.0 5.0 - 8.0   Glucose, UA NEGATIVE NEGATIVE mg/dL   Hgb urine dipstick NEGATIVE NEGATIVE   Bilirubin Urine NEGATIVE NEGATIVE   Ketones, ur NEGATIVE NEGATIVE mg/dL   Protein, ur NEGATIVE NEGATIVE mg/dL   Nitrite NEGATIVE NEGATIVE   Leukocytes,Ua NEGATIVE NEGATIVE   RBC / HPF 0-5 0 - 5 RBC/hpf   WBC, UA 0-5 0 - 5 WBC/hpf   Bacteria, UA NONE SEEN NONE SEEN   Squamous Epithelial /  HPF 0-5 0 - 5 /HPF   Mucus PRESENT   I-stat chem 8, ED  Result Value Ref Range   Sodium 139 135 - 145 mmol/L   Potassium 3.9 3.5 - 5.1 mmol/L   Chloride 103 98 - 111 mmol/L   BUN 18 8 - 23 mg/dL   Creatinine, Ser 1.61 0.61 - 1.24 mg/dL   Glucose, Bld 86 70 - 99 mg/dL   Calcium, Ion 0.96 0.45 - 1.40 mmol/L   TCO2 26 22 - 32 mmol/L   Hemoglobin 12.9 (L) 13.0 - 17.0 g/dL   HCT 40.9 (L) 81.1 - 91.4 %  CBG monitoring, ED  Result Value Ref Range   Glucose-Capillary 96 70 - 99 mg/dL   MR BRAIN WO CONTRAST  Result Date: 12/20/2022 CLINICAL DATA:  Acute neurologic deficit EXAM: MRI HEAD WITHOUT CONTRAST MRA HEAD WITHOUT CONTRAST TECHNIQUE: Multiplanar, multi-echo pulse sequences of the brain and surrounding structures were acquired without intravenous contrast. Angiographic images of the Circle of Willis were acquired using MRA technique without intravenous contrast. COMPARISON:  06/21/2019 brain MRI FINDINGS: MRI HEAD FINDINGS Brain: No acute infarct, mass effect or extra-axial collection. No acute or chronic hemorrhage. There is multifocal hyperintense T2-weighted signal within the white  matter. Generalized volume loss. The midline structures are normal. Vascular: Major flow voids are preserved. Skull and upper cervical spine: Normal calvarium and skull base. Visualized upper cervical spine and soft tissues are normal. Sinuses/Orbits:No paranasal sinus fluid levels or advanced mucosal thickening. No mastoid or middle ear effusion. Normal orbits. MRA HEAD FINDINGS POSTERIOR CIRCULATION: --Vertebral arteries: Normal --Inferior cerebellar arteries: Normal. --Basilar artery: Normal. --Superior cerebellar arteries: Normal. --Posterior cerebral arteries: Normal. ANTERIOR CIRCULATION: --Intracranial internal carotid arteries: Normal. --Anterior cerebral arteries (ACA): Normal. --Middle cerebral arteries (MCA): Normal. IMPRESSION: 1. No acute intracranial abnormality. 2. Normal intracranial MRA. 3. Findings of chronic small vessel ischemia and volume loss. Electronically Signed   By: Deatra Robinson M.D.   On: 12/20/2022 01:06   MR ANGIO HEAD WO CONTRAST  Result Date: 12/20/2022 CLINICAL DATA:  Acute neurologic deficit EXAM: MRI HEAD WITHOUT CONTRAST MRA HEAD WITHOUT CONTRAST TECHNIQUE: Multiplanar, multi-echo pulse sequences of the brain and surrounding structures were acquired without intravenous contrast. Angiographic images of the Circle of Willis were acquired using MRA technique without intravenous contrast. COMPARISON:  06/21/2019 brain MRI FINDINGS: MRI HEAD FINDINGS Brain: No acute infarct, mass effect or extra-axial collection. No acute or chronic hemorrhage. There is multifocal hyperintense T2-weighted signal within the white matter. Generalized volume loss. The midline structures are normal. Vascular: Major flow voids are preserved. Skull and upper cervical spine: Normal calvarium and skull base. Visualized upper cervical spine and soft tissues are normal. Sinuses/Orbits:No paranasal sinus fluid levels or advanced mucosal thickening. No mastoid or middle ear effusion. Normal orbits. MRA HEAD  FINDINGS POSTERIOR CIRCULATION: --Vertebral arteries: Normal --Inferior cerebellar arteries: Normal. --Basilar artery: Normal. --Superior cerebellar arteries: Normal. --Posterior cerebral arteries: Normal. ANTERIOR CIRCULATION: --Intracranial internal carotid arteries: Normal. --Anterior cerebral arteries (ACA): Normal. --Middle cerebral arteries (MCA): Normal. IMPRESSION: 1. No acute intracranial abnormality. 2. Normal intracranial MRA. 3. Findings of chronic small vessel ischemia and volume loss. Electronically Signed   By: Deatra Robinson M.D.   On: 12/20/2022 01:06   CT HEAD WO CONTRAST  Result Date: 12/19/2022 CLINICAL DATA:  Stroke-like symptoms EXAM: CT HEAD WITHOUT CONTRAST TECHNIQUE: Contiguous axial images were obtained from the base of the skull through the vertex without intravenous contrast. RADIATION DOSE REDUCTION: This exam was performed according to the  departmental dose-optimization program which includes automated exposure control, adjustment of the mA and/or kV according to patient size and/or use of iterative reconstruction technique. COMPARISON:  06/21/2019 FINDINGS: Brain: No evidence of acute infarction, hemorrhage, hydrocephalus, extra-axial collection or mass lesion/mass effect. Mild atrophic changes and chronic white matter ischemic changes are noted. Vascular: No hyperdense vessel or unexpected calcification. Skull: Normal. Negative for fracture or focal lesion. Sinuses/Orbits: No acute finding. Other: None. IMPRESSION: Chronic atrophic and ischemic changes without acute abnormality. Electronically Signed   By: Alcide Clever M.D.   On: 12/19/2022 19:40      Antony Madura, PA-C 12/20/22 Azzie Roup, April, MD 12/20/22 0347

## 2022-12-20 NOTE — Discharge Instructions (Signed)
Your evaluation in the ED today was reassuring.  We recommend close follow-up with your primary care doctor.  You may return for new or concerning symptoms.

## 2022-12-23 ENCOUNTER — Other Ambulatory Visit: Payer: Self-pay

## 2022-12-23 MED ORDER — FINASTERIDE 5 MG PO TABS: 5.0000 mg | ORAL_TABLET | Freq: Every day | ORAL | 3 refills | Status: DC

## 2022-12-30 NOTE — Progress Notes (Signed)
APTT is part of stroke order set

## 2023-03-16 ENCOUNTER — Ambulatory Visit: Payer: Medicare Other | Admitting: Urology

## 2023-04-13 ENCOUNTER — Ambulatory Visit: Payer: Medicare Other | Admitting: Urology

## 2023-04-13 VITALS — BP 122/69 | HR 49

## 2023-04-13 DIAGNOSIS — R350 Frequency of micturition: Secondary | ICD-10-CM

## 2023-04-13 DIAGNOSIS — N401 Enlarged prostate with lower urinary tract symptoms: Secondary | ICD-10-CM

## 2023-04-13 DIAGNOSIS — N138 Other obstructive and reflux uropathy: Secondary | ICD-10-CM

## 2023-04-13 DIAGNOSIS — Z8551 Personal history of malignant neoplasm of bladder: Secondary | ICD-10-CM

## 2023-04-13 DIAGNOSIS — R3912 Poor urinary stream: Secondary | ICD-10-CM | POA: Diagnosis not present

## 2023-04-13 MED ORDER — FINASTERIDE 5 MG PO TABS: 5.0000 mg | ORAL_TABLET | Freq: Every day | ORAL | 3 refills | Status: AC

## 2023-04-13 MED ORDER — GEMTESA 75 MG PO TABS
1.0000 | ORAL_TABLET | Freq: Every day | ORAL | 11 refills | Status: AC
Start: 2023-04-13 — End: ?

## 2023-04-13 NOTE — Progress Notes (Signed)
Pvr= 0

## 2023-04-13 NOTE — Progress Notes (Unsigned)
04/13/2023 2:34 PM   Travis [name] Sep 29, 1931 [id number]  Referring provider: Lauro Regulus, MD [address] [facility] [place],  [place] [address]  No chief complaint on file.   HPI:  F/u -    1) h/o High risk bladder ca - TURBT 09/2016 with Dr. Sherryl Barters for large >6cm HG Ta UCC right bladder tumor. Cystoscopy benign Jul 2024.    Imaging/staging: -Mar 2020 CT a/p - benign  -Apr 2023 CT a/p - benign, BPH  -June 2024 CT a/p - benign    Intravesical tx: -2018 BCG induction x 6 -2019 BCG maintenance - declined further BCG (in part due to COVID in 2020).     2) BPH - CT scan in 2013 with prostate enlargement noted (~100g). He had frequency and urgency and was started on oxybutynin in 2019. Symptoms returned when he ran out of oxybutynin. He has urgency and occ UUI. He does have some confusion and constipation. No gross hematuria. Gemtesa working well. His stream is good. Noc x 1. Daytime frequency and urgency.    He started finasteride Mar 2023 for BPH and LUTS. Inc freq/urge in Oct 2023. Rx for [place] given. He is taking it. Noc x 1- 2 -3-4. He uses a urinal. They tried to go QOD but nocturia and frequency worse. Admit June 2024 with fever. CXR and CT benign. BPH present. Tx with abx.    Today, seen for the above. PVR 0. UA clear. No voiding complaints.   PMH: Past Medical History:  Diagnosis Date   Arthritis    Cancer (HCC)    BLADDER   Complication of anesthesia    had a hard time recovering from anesthesia after knee replacement neuro symptoms   Dementia (HCC)    Hyperlipidemia    Parkinson disease     Surgical History: Past Surgical History:  Procedure Laterality Date   eye implants  1992   EYE SURGERY Bilateral    Cataract Extraction with IOL Implants   HERNIA REPAIR Right 1992   Inguinal Hernia Repair   INGUINAL HERNIA REPAIR Left 01/03/2016   Procedure: LAPAROSCOPIC INGUINAL HERNIA;  Surgeon: Leafy Ro, MD;  Location: [facility] ORS;   Service: General;  Laterality: Left;   INGUINAL HERNIA REPAIR Left 06/01/2019   Procedure: HERNIA REPAIR INGUINAL ADULT;  Surgeon: Carolan Shiver, MD;  Location: [facility] ORS;  Service: General;  Laterality: Left;   JOINT REPLACEMENT     TOTAL KNEE ARTHROPLASTY Left 03/18/2016   Procedure: TOTAL KNEE ARTHROPLASTY;  Surgeon: Kennedy Bucker, MD;  Location: [facility] ORS;  Service: Orthopedics;  Laterality: Left;   TRANSURETHRAL RESECTION OF BLADDER TUMOR N/A 09/19/2016   Procedure: TRANSURETHRAL RESECTION OF BLADDER TUMOR (TURBT) MEDIUM;  Surgeon: Hildred Laser, MD;  Location: [facility] ORS;  Service: Urology;  Laterality: N/A;    Home Medications:  Allergies as of 04/13/2023   No Known Allergies      Medication List        Accurate as of April 13, 2023  2:34 PM. If you have any questions, ask your nurse or doctor.          acetaminophen 650 MG CR tablet Commonly known as: TYLENOL Take 1,300 mg by mouth daily as needed for pain.   ascorbic acid 500 MG tablet Commonly known as: VITAMIN C Take 1 tablet (500 mg total) by mouth daily.   cetirizine 10 MG tablet Commonly known as: ZYRTEC Take 10 mg by mouth daily.   cyanocobalamin  1000 MCG tablet Commonly known as: VITAMIN B12 Take 1,000 mcg by mouth daily.   finasteride 5 MG tablet Commonly known as: PROSCAR Take 1 tablet (5 mg total) by mouth daily.   furosemide 20 MG tablet Commonly known as: LASIX Take 20 mg by mouth daily.   Gemtesa 75 MG Tabs Generic drug: Vibegron Take 1 tablet (75 mg total) by mouth daily.   pantoprazole 40 MG tablet Commonly known as: PROTONIX Take 40 mg by mouth daily.   tamsulosin 0.4 MG Caps capsule Commonly known as: FLOMAX Take 1 capsule (0.4 mg total) by mouth daily after supper.   VITAMIN D PO Take 1 tablet by mouth daily.   zinc sulfate (50mg  elemental zinc) 220 (50 Zn) MG capsule Take 1 capsule (220 mg total) by mouth daily.        Allergies: No Known Allergies  Family  History: Family History  Problem Relation Age of Onset   Stroke Father    Diabetes Sister    Breast cancer Sister    Cancer Maternal Aunt    Hematuria Neg Hx    Renal cancer Neg Hx    Prostate cancer Neg Hx    Bladder Cancer Neg Hx     Social History:  reports that he has never smoked. He has never used smokeless tobacco. He reports that he does not drink alcohol and does not use drugs.   Physical Exam: BP 122/69   Pulse (!) 49   Constitutional:  Alert and oriented, No acute distress. HEENT: [place] AT, moist mucus membranes.  Trachea midline, no masses. Cardiovascular: No clubbing, cyanosis, or edema. Respiratory: Normal respiratory effort, no increased work of breathing. GI: Abdomen is soft, nontender, nondistended, no abdominal masses GU: No CVA tenderness Skin: No rashes, bruises or suspicious lesions. Neurologic: Grossly intact, no focal deficits, moving all 4 extremities. Psychiatric: Normal mood and affect.  Laboratory Data: Lab Results  Component Value Date   WBC 5.1 12/19/2022   HGB 12.9 (L) 12/19/2022   HCT 38.0 (L) 12/19/2022   MCV 77.9 (L) 12/19/2022   PLT 226 12/19/2022    Lab Results  Component Value Date   CREATININE 1.20 12/19/2022    No results found for: "PSA"  No results found for: "TESTOSTERONE"  No results found for: "HGBA1C"  Urinalysis    Component Value Date/Time   COLORURINE YELLOW 12/19/2022 2109   APPEARANCEUR CLEAR 12/19/2022 2109   APPEARANCEUR Clear 11/24/2022 1432   LABSPEC 1.021 12/19/2022 2109   PHURINE 5.0 12/19/2022 2109   GLUCOSEU NEGATIVE 12/19/2022 2109   HGBUR NEGATIVE 12/19/2022 2109   BILIRUBINUR NEGATIVE 12/19/2022 2109   BILIRUBINUR Negative 11/24/2022 1432   KETONESUR NEGATIVE 12/19/2022 2109   PROTEINUR NEGATIVE 12/19/2022 2109   NITRITE NEGATIVE 12/19/2022 2109   LEUKOCYTESUR NEGATIVE 12/19/2022 2109    Lab Results  Component Value Date   LABMICR See below: 11/24/2022   WBCUA 0-5 11/24/2022   RBCUA None  seen 03/12/2018   LABEPIT 0-10 11/24/2022   MUCUS Present (A) 03/06/2022   BACTERIA NONE SEEN 12/19/2022    Pertinent Imaging:   Assessment & Plan:    Bph, weak st  -- continue tamsulosin. Doing well. Tams and finasteride refilled. Discussed nature r.b.a to tams, 5ari and gemtesa.   H/o bladder ca- cysto in 6 mo   No follow-ups on file.  Jerilee Field, MD  [facility]  [address] [place], [place] [address] [phone]

## 2023-04-14 LAB — URINALYSIS, ROUTINE W REFLEX MICROSCOPIC
Bilirubin, UA: NEGATIVE
Glucose, UA: NEGATIVE
Ketones, UA: NEGATIVE
Leukocytes,UA: NEGATIVE
Nitrite, UA: NEGATIVE
Protein,UA: NEGATIVE
Specific Gravity, UA: 1.03 (ref 1.005–1.030)
Urobilinogen, Ur: 1 mg/dL (ref 0.2–1.0)
pH, UA: 5.5 (ref 5.0–7.5)

## 2023-04-14 LAB — MICROSCOPIC EXAMINATION
Bacteria, UA: NONE SEEN
WBC, UA: NONE SEEN /[HPF] (ref 0–5)

## 2023-05-26 ENCOUNTER — Telehealth: Payer: Self-pay | Admitting: Urology

## 2023-05-26 NOTE — Telephone Encounter (Signed)
**Note De-identified  [place] Obfuscation** Please advise 

## 2023-05-26 NOTE — Telephone Encounter (Signed)
 Medicare is not covering the Gemtesa right now until pt meets his $2000  deductible , is there something else he can take? Or samples ?

## 2023-05-28 NOTE — Telephone Encounter (Signed)
Hey just need a little clarification on which prescription and dosage, quantity and sig

## 2023-05-29 ENCOUNTER — Other Ambulatory Visit: Payer: Self-pay

## 2023-05-29 DIAGNOSIS — R350 Frequency of micturition: Secondary | ICD-10-CM

## 2023-05-29 MED ORDER — DARIFENACIN HYDROBROMIDE ER 7.5 MG PO TB24: 7.5000 mg | ORAL_TABLET | Freq: Every day | ORAL | 11 refills | Status: DC

## 2023-05-29 NOTE — Telephone Encounter (Signed)
Called Pt daughter to let her know e sent in a new Rx for her father to try per MD

## 2023-08-23 ENCOUNTER — Encounter (HOSPITAL_COMMUNITY): Payer: Self-pay

## 2023-08-23 ENCOUNTER — Inpatient Hospital Stay (HOSPITAL_COMMUNITY)
Admission: EM | Admit: 2023-08-23 | Discharge: 2023-08-27 | DRG: 689 | Disposition: A | Discharge status: Skilled Nursing Facility | Attending: Family Medicine | Admitting: Family Medicine

## 2023-08-23 ENCOUNTER — Emergency Department (HOSPITAL_COMMUNITY)

## 2023-08-23 ENCOUNTER — Other Ambulatory Visit: Payer: Self-pay

## 2023-08-23 DIAGNOSIS — Z79899 Other long term (current) drug therapy: Secondary | ICD-10-CM

## 2023-08-23 DIAGNOSIS — Z803 Family history of malignant neoplasm of breast: Secondary | ICD-10-CM

## 2023-08-23 DIAGNOSIS — M179 Osteoarthritis of knee, unspecified: Secondary | ICD-10-CM | POA: Diagnosis present

## 2023-08-23 DIAGNOSIS — Z96652 Presence of left artificial knee joint: Secondary | ICD-10-CM | POA: Diagnosis present

## 2023-08-23 DIAGNOSIS — Z1152 Encounter for screening for COVID-19: Secondary | ICD-10-CM

## 2023-08-23 DIAGNOSIS — R6884 Jaw pain: Secondary | ICD-10-CM | POA: Diagnosis present

## 2023-08-23 DIAGNOSIS — N390 Urinary tract infection, site not specified: Secondary | ICD-10-CM | POA: Diagnosis not present

## 2023-08-23 DIAGNOSIS — S270XXA Traumatic pneumothorax, initial encounter: Secondary | ICD-10-CM | POA: Diagnosis present

## 2023-08-23 DIAGNOSIS — F02B0 Dementia in other diseases classified elsewhere, moderate, without behavioral disturbance, psychotic disturbance, mood disturbance, and anxiety: Secondary | ICD-10-CM

## 2023-08-23 DIAGNOSIS — L89152 Pressure ulcer of sacral region, stage 2: Secondary | ICD-10-CM | POA: Diagnosis present

## 2023-08-23 DIAGNOSIS — G9341 Metabolic encephalopathy: Secondary | ICD-10-CM | POA: Diagnosis not present

## 2023-08-23 DIAGNOSIS — D509 Iron deficiency anemia, unspecified: Secondary | ICD-10-CM | POA: Diagnosis present

## 2023-08-23 DIAGNOSIS — R5383 Other fatigue: Secondary | ICD-10-CM | POA: Diagnosis present

## 2023-08-23 DIAGNOSIS — M1711 Unilateral primary osteoarthritis, right knee: Secondary | ICD-10-CM | POA: Diagnosis present

## 2023-08-23 DIAGNOSIS — I77810 Thoracic aortic ectasia: Secondary | ICD-10-CM | POA: Diagnosis present

## 2023-08-23 DIAGNOSIS — R7989 Other specified abnormal findings of blood chemistry: Secondary | ICD-10-CM | POA: Diagnosis present

## 2023-08-23 DIAGNOSIS — W19XXXA Unspecified fall, initial encounter: Secondary | ICD-10-CM

## 2023-08-23 DIAGNOSIS — R9389 Abnormal findings on diagnostic imaging of other specified body structures: Secondary | ICD-10-CM | POA: Diagnosis present

## 2023-08-23 DIAGNOSIS — N138 Other obstructive and reflux uropathy: Secondary | ICD-10-CM | POA: Diagnosis present

## 2023-08-23 DIAGNOSIS — Z823 Family history of stroke: Secondary | ICD-10-CM

## 2023-08-23 DIAGNOSIS — M25561 Pain in right knee: Secondary | ICD-10-CM | POA: Diagnosis present

## 2023-08-23 DIAGNOSIS — E785 Hyperlipidemia, unspecified: Secondary | ICD-10-CM | POA: Diagnosis present

## 2023-08-23 DIAGNOSIS — Y92009 Unspecified place in unspecified non-institutional (private) residence as the place of occurrence of the external cause: Secondary | ICD-10-CM

## 2023-08-23 DIAGNOSIS — Z8551 Personal history of malignant neoplasm of bladder: Secondary | ICD-10-CM

## 2023-08-23 DIAGNOSIS — F0280 Dementia in other diseases classified elsewhere without behavioral disturbance: Secondary | ICD-10-CM | POA: Diagnosis present

## 2023-08-23 DIAGNOSIS — G20A1 Dementia in other diseases classified elsewhere, unspecified severity, without behavioral disturbance, psychotic disturbance, mood disturbance, and anxiety: Secondary | ICD-10-CM | POA: Diagnosis present

## 2023-08-23 DIAGNOSIS — Z833 Family history of diabetes mellitus: Secondary | ICD-10-CM

## 2023-08-23 DIAGNOSIS — N401 Enlarged prostate with lower urinary tract symptoms: Secondary | ICD-10-CM | POA: Diagnosis present

## 2023-08-23 DIAGNOSIS — I10 Essential (primary) hypertension: Secondary | ICD-10-CM | POA: Diagnosis present

## 2023-08-23 DIAGNOSIS — J939 Pneumothorax, unspecified: Secondary | ICD-10-CM | POA: Diagnosis not present

## 2023-08-23 DIAGNOSIS — R269 Unspecified abnormalities of gait and mobility: Secondary | ICD-10-CM

## 2023-08-23 DIAGNOSIS — W1830XA Fall on same level, unspecified, initial encounter: Secondary | ICD-10-CM | POA: Diagnosis present

## 2023-08-23 DIAGNOSIS — R319 Hematuria, unspecified: Secondary | ICD-10-CM

## 2023-08-23 DIAGNOSIS — L98429 Non-pressure chronic ulcer of back with unspecified severity: Secondary | ICD-10-CM | POA: Diagnosis present

## 2023-08-23 LAB — URINALYSIS, W/ REFLEX TO CULTURE (INFECTION SUSPECTED)
Bilirubin Urine: NEGATIVE
Glucose, UA: NEGATIVE mg/dL
Ketones, ur: 20 mg/dL — AB
Leukocytes,Ua: NEGATIVE
Nitrite: NEGATIVE
Protein, ur: 30 mg/dL — AB
Specific Gravity, Urine: 1.026 (ref 1.005–1.030)
WBC, UA: >50 WBC/hpf (ref 0–5)
pH: 5 (ref 5.0–8.0)

## 2023-08-23 LAB — RESP PANEL BY RT-PCR (RSV, FLU A&B, COVID)  RVPGX2
Influenza A by PCR: NEGATIVE
Influenza B by PCR: NEGATIVE
Resp Syncytial Virus by PCR: NEGATIVE
SARS Coronavirus 2 by RT PCR: NEGATIVE

## 2023-08-23 LAB — TROPONIN I (HIGH SENSITIVITY)
Troponin I (High Sensitivity): 47 ng/L — ABNORMAL HIGH (ref <18)
Troponin I (High Sensitivity): 43 ng/L — ABNORMAL HIGH (ref <18)

## 2023-08-23 LAB — CBC WITH DIFFERENTIAL/PLATELET
Abs Immature Granulocytes: 0.02 10*3/uL (ref 0.00–0.07)
Basophils Absolute: 0 10*3/uL (ref 0.0–0.1)
Basophils Relative: 0 %
Eosinophils Absolute: 0 10*3/uL (ref 0.0–0.5)
Eosinophils Relative: 0 %
HCT: 41.4 % (ref 39.0–52.0)
Hemoglobin: 13.2 g/dL (ref 13.0–17.0)
Immature Granulocytes: 0 %
Lymphocytes Relative: 20 %
Lymphs Abs: 1.4 10*3/uL (ref 0.7–4.0)
MCH: 25.4 pg — ABNORMAL LOW (ref 26.0–34.0)
MCHC: 31.9 g/dL (ref 30.0–36.0)
MCV: 79.6 fL — ABNORMAL LOW (ref 80.0–100.0)
Monocytes Absolute: 0.9 10*3/uL (ref 0.1–1.0)
Monocytes Relative: 13 %
Neutro Abs: 4.7 10*3/uL (ref 1.7–7.7)
Neutrophils Relative %: 67 %
Platelets: 233 10*3/uL (ref 150–400)
RBC: 5.2 MIL/uL (ref 4.22–5.81)
RDW: 14.7 % (ref 11.5–15.5)
WBC: 7.1 10*3/uL (ref 4.0–10.5)
nRBC: 0 % (ref 0.0–0.2)

## 2023-08-23 LAB — COMPREHENSIVE METABOLIC PANEL WITH GFR
ALT: 13 U/L (ref 0–44)
AST: 26 U/L (ref 15–41)
Albumin: 3.5 g/dL (ref 3.5–5.0)
Alkaline Phosphatase: 73 U/L (ref 38–126)
Anion gap: 13 (ref 5–15)
BUN: 14 mg/dL (ref 8–23)
CO2: 24 mmol/L (ref 22–32)
Calcium: 9.8 mg/dL (ref 8.9–10.3)
Chloride: 102 mmol/L (ref 98–111)
Creatinine, Ser: 0.81 mg/dL (ref 0.61–1.24)
GFR, Estimated: >60 mL/min (ref >60)
Glucose, Bld: 101 mg/dL — ABNORMAL HIGH (ref 70–99)
Potassium: 3.7 mmol/L (ref 3.5–5.1)
Sodium: 139 mmol/L (ref 135–145)
Total Bilirubin: 0.8 mg/dL (ref 0.0–1.2)
Total Protein: 7.2 g/dL (ref 6.5–8.1)

## 2023-08-23 MED ORDER — ACETAMINOPHEN 325 MG PO TABS
650.0000 mg | ORAL_TABLET | Freq: Four times a day (QID) | ORAL | Status: DC | PRN
  Administered 2023-08-24 – 2023-08-27 (×5): 650 mg via ORAL
  Filled 2023-08-23 (×5): qty 2

## 2023-08-23 MED ORDER — ENOXAPARIN SODIUM 40 MG/0.4ML IJ SOSY
40.0000 mg | PREFILLED_SYRINGE | INTRAMUSCULAR | Status: DC
  Administered 2023-08-23 – 2023-08-26 (×4): 40 mg via SUBCUTANEOUS
  Filled 2023-08-23 (×4): qty 0.4

## 2023-08-23 MED ORDER — DICLOFENAC SODIUM 1 % EX GEL
2.0000 g | Freq: Four times a day (QID) | CUTANEOUS | Status: DC
  Administered 2023-08-23 – 2023-08-27 (×14): 2 g via TOPICAL
  Filled 2023-08-23: qty 100

## 2023-08-23 MED ORDER — ALBUTEROL SULFATE (2.5 MG/3ML) 0.083% IN NEBU: 2.5000 mg | INHALATION_SOLUTION | Freq: Four times a day (QID) | RESPIRATORY_TRACT | Status: DC | PRN

## 2023-08-23 MED ORDER — CARBIDOPA-LEVODOPA 25-100 MG PO TABS
0.5000 | ORAL_TABLET | Freq: Two times a day (BID) | ORAL | Status: DC
  Administered 2023-08-23 – 2023-08-27 (×8): 0.5 via ORAL
  Filled 2023-08-23 (×8): qty 1

## 2023-08-23 MED ORDER — MIRABEGRON ER 25 MG PO TB24
25.0000 mg | ORAL_TABLET | Freq: Every day | ORAL | Status: DC
  Administered 2023-08-23 – 2023-08-27 (×5): 25 mg via ORAL
  Filled 2023-08-23 (×5): qty 1

## 2023-08-23 MED ORDER — SODIUM CHLORIDE 0.9 % IV SOLN
INTRAVENOUS | Status: AC
  Administered 2023-08-23: 75 mL/h via INTRAVENOUS

## 2023-08-23 MED ORDER — FENTANYL CITRATE PF 50 MCG/ML IJ SOSY
12.5000 ug | PREFILLED_SYRINGE | Freq: Once | INTRAMUSCULAR | Status: AC
  Administered 2023-08-23: 12.5 ug via INTRAVENOUS
  Filled 2023-08-23: qty 1

## 2023-08-23 MED ORDER — PANTOPRAZOLE SODIUM 40 MG PO TBEC
40.0000 mg | DELAYED_RELEASE_TABLET | Freq: Every day | ORAL | Status: DC
  Administered 2023-08-23 – 2023-08-27 (×5): 40 mg via ORAL
  Filled 2023-08-23 (×5): qty 1

## 2023-08-23 MED ORDER — SODIUM CHLORIDE 0.9 % IV SOLN
1.0000 g | INTRAVENOUS | Status: DC
  Administered 2023-08-23 – 2023-08-24 (×2): 1 g via INTRAVENOUS
  Filled 2023-08-23 (×3): qty 10

## 2023-08-23 MED ORDER — SODIUM CHLORIDE 0.9% FLUSH
3.0000 mL | Freq: Two times a day (BID) | INTRAVENOUS | Status: DC
  Administered 2023-08-23 – 2023-08-27 (×9): 3 mL via INTRAVENOUS

## 2023-08-23 MED ORDER — ACETAMINOPHEN 650 MG RE SUPP: 650.0000 mg | Freq: Four times a day (QID) | RECTAL | Status: DC | PRN

## 2023-08-23 MED ORDER — FINASTERIDE 5 MG PO TABS
5.0000 mg | ORAL_TABLET | Freq: Every day | ORAL | Status: DC
  Administered 2023-08-23 – 2023-08-27 (×5): 5 mg via ORAL
  Filled 2023-08-23 (×5): qty 1

## 2023-08-23 MED ORDER — LORATADINE 10 MG PO TABS
10.0000 mg | ORAL_TABLET | Freq: Every day | ORAL | Status: DC
  Administered 2023-08-23 – 2023-08-27 (×5): 10 mg via ORAL
  Filled 2023-08-23 (×5): qty 1

## 2023-08-23 MED ORDER — TAMSULOSIN HCL 0.4 MG PO CAPS
0.4000 mg | ORAL_CAPSULE | Freq: Every day | ORAL | Status: DC
  Administered 2023-08-24 – 2023-08-26 (×3): 0.4 mg via ORAL
  Filled 2023-08-23 (×3): qty 1

## 2023-08-23 NOTE — ED Notes (Signed)
 Pt is OTF with transport for CT. Pt is in no new onset distress at this time.

## 2023-08-23 NOTE — ED Provider Notes (Signed)
 [facility] Provider Note   CSN: [id number] Arrival date & time: 08/23/23  4098     History  Chief Complaint  Patient presents with   Altered Mental Status    WILLIOM CEDAR is a 89 y.o. male past medical history significant for arthritis, Parkinson's, dementia presents today for decreased oral intake and activity over the past week.  Patient's daughter states that he is not as alert as he normally is.  Patient daughter also states that he has left-sided jaw pain and sacral ulcer.  Upon talking to the patient's family again, patient's daughter states that he did fall onto his right side on Tuesday   Altered Mental Status      Home Medications Prior to Admission medications   Medication Sig Start Date End Date Taking? Authorizing Provider  acetaminophen (TYLENOL) 650 MG CR tablet Take 1,300 mg by mouth daily as needed for pain.    [provider]  ascorbic acid (VITAMIN C) 500 MG tablet Take 1 tablet (500 mg total) by mouth daily. 10/24/22   Regalado, Belkys A, MD  cetirizine (ZYRTEC) 10 MG tablet Take 10 mg by mouth daily.    [provider]  darifenacin (ENABLEX) 7.5 MG 24 hr tablet Take 1 tablet (7.5 mg total) by mouth daily. 05/29/23   Christina Coyer, MD  finasteride (PROSCAR) 5 MG tablet Take 1 tablet (5 mg total) by mouth daily. 04/13/23   Christina Coyer, MD  furosemide (LASIX) 20 MG tablet Take 20 mg by mouth daily. Patient not taking: Reported on 04/13/2023 11/04/22 11/04/23  [provider]  pantoprazole (PROTONIX) 40 MG tablet Take 40 mg by mouth daily. 05/15/21   [provider]  tamsulosin (FLOMAX) 0.4 MG CAPS capsule Take 1 capsule (0.4 mg total) by mouth daily after supper. 11/24/22   Christina Coyer, MD  Vibegron (GEMTESA) 75 MG TABS Take 1 tablet (75 mg total) by mouth daily. 04/13/23   Christina Coyer, MD  vitamin B-12 (CYANOCOBALAMIN) 1000 MCG tablet Take 1,000 mcg by mouth daily.      [provider]  VITAMIN D PO Take 1 tablet by mouth daily.    [provider]  zinc sulfate 220 (50 Zn) MG capsule Take 1 capsule (220 mg total) by mouth daily. 10/24/22   Regalado, Brigido Canales, MD      Allergies    Patient has no known allergies.    Review of Systems   Review of Systems  Constitutional:  Positive for activity change and appetite change.    Physical Exam Updated Vital Signs BP 104/79 (BP Location: Right Arm)   Pulse 84   Temp 98.7 F (37.1 C) (Oral)   Resp 17   Ht 5\' 9"  (1.753 m)   Wt 82.6 kg   SpO2 99%   BMI 26.88 kg/m  Physical Exam Vitals and nursing note reviewed.  Constitutional:      General: He is not in acute distress.    Appearance: Normal appearance. He is well-developed. He is not ill-appearing, toxic-appearing or diaphoretic.  HENT:     Head: Normocephalic and atraumatic.     Right Ear: External ear normal.     Left Ear: External ear normal.     Nose: Nose normal.     Mouth/Throat:     Mouth: Mucous membranes are moist.  Eyes:     Extraocular Movements: Extraocular movements intact.     Conjunctiva/sclera: Conjunctivae normal.  Cardiovascular:     Rate  and Rhythm: Normal rate and regular rhythm.     Pulses: Normal pulses.     Heart sounds: No murmur heard. Pulmonary:     Effort: Pulmonary effort is normal. No respiratory distress.     Breath sounds: Normal breath sounds.  Abdominal:     Palpations: Abdomen is soft.     Tenderness: There is no abdominal tenderness.  Musculoskeletal:        General: No swelling.     Cervical back: Neck supple.  Skin:    General: Skin is warm and dry.     Capillary Refill: Capillary refill takes less than 2 seconds.     Comments: Approximately to 2 cm by half a centimeter ulcer in the patient's gluteal cleft stage I to stage II without any signs of infection or drainage.  Neurological:     Mental Status: He is alert. Mental status is at baseline.  Psychiatric:        Mood and  Affect: Mood normal.     ED Results / Procedures / Treatments   Labs (all labs ordered are listed, but only abnormal results are displayed) Labs Reviewed  COMPREHENSIVE METABOLIC PANEL WITH GFR - Abnormal; Notable for the following components:      Result Value   Glucose, Bld 101 (*)    All other components within normal limits  CBC WITH DIFFERENTIAL/PLATELET - Abnormal; Notable for the following components:   MCV 79.6 (*)    MCH 25.4 (*)    All other components within normal limits  URINALYSIS, W/ REFLEX TO CULTURE (INFECTION SUSPECTED) - Abnormal; Notable for the following components:   Color, Urine AMBER (*)    APPearance TURBID (*)    Hgb urine dipstick SMALL (*)    Ketones, ur 20 (*)    Protein, ur 30 (*)    Bacteria, UA RARE (*)    All other components within normal limits  TROPONIN I (HIGH SENSITIVITY) - Abnormal; Notable for the following components:   Troponin I (High Sensitivity) 47 (*)    All other components within normal limits  TROPONIN I (HIGH SENSITIVITY) - Abnormal; Notable for the following components:   Troponin I (High Sensitivity) 43 (*)    All other components within normal limits  URINE CULTURE    EKG EKG Interpretation Date/Time:  Sunday August 23 2023 08:40:56 EDT Ventricular Rate:  75 PR Interval:  153 QRS Duration:  87 QT Interval:  387 QTC Calculation: 433 R Axis:   -36  Text Interpretation: Sinus rhythm Left axis deviation when compared to prior, similar appearance with less PVC No STEMI Confirmed by Wynell Heath ([id number]) on 08/23/2023 9:22:50 AM  Radiology CT Head Wo Contrast Result Date: 08/23/2023 CLINICAL DATA:  Neck trauma (Age >= 65y); Head trauma, minor (Age >= 65y). Altered mental status. EXAM: CT HEAD WITHOUT CONTRAST CT CERVICAL SPINE WITHOUT CONTRAST TECHNIQUE: Multidetector CT imaging of the head and cervical spine was performed following the standard protocol without intravenous contrast. Multiplanar CT image reconstructions of  the cervical spine were also generated. RADIATION DOSE REDUCTION: This exam was performed according to the departmental dose-optimization program which includes automated exposure control, adjustment of the mA and/or kV according to patient size and/or use of iterative reconstruction technique. COMPARISON:  CT head 12/19/2022. MRI head 12/20/2022. MRI cervical spine 12/13/2020. FINDINGS: CT HEAD FINDINGS Brain: There is no evidence of an acute infarct, intracranial hemorrhage, mass, midline shift, or extra-axial fluid collection. There is mild cerebral atrophy. Cerebral white matter hypodensities are  unchanged and nonspecific but compatible with mild chronic small vessel ischemic disease. Vascular: Extensive calcified atherosclerosis at the skull base. No acutely suspicious hyperdense vessel. Skull: No acute fracture or suspicious lesion. Sinuses/Orbits: The visualized mastoid air cells and paranasal sinuses are clear. Bilateral cataract extraction. Other: None. CT CERVICAL SPINE FINDINGS Alignment: Trace retrolisthesis of C3 on C4, unchanged. Skull base and vertebrae: No acute fracture. 1 cm lucent lesion in the posterior aspect of the C7 vertebral body on the left without a corresponding abnormality on the prior MRI. Scattered small Schmorl's nodes. Soft tissues and spinal canal: No prevertebral fluid or swelling. No visible canal hematoma. Disc levels: Moderate cervical disc degeneration with multilevel spinal stenosis, moderate at C4-5. Mild-to-moderate multilevel neural foraminal stenosis. Upper chest: More fully evaluated on today's separately reported chest CT. Other: None. IMPRESSION: 1. No evidence of acute intracranial abnormality. 2. Mild chronic small vessel ischemic disease. 3. No acute cervical spine fracture or traumatic malalignment. 4. 1 cm lucent lesion in the C7 vertebral body, indeterminate but potentially new from 2022. Consider nonemergent cervical spine MRI without and with contrast for  further evaluation. Electronically Signed   By: Aundra Lee M.D.   On: 08/23/2023 14:21   CT Cervical Spine Wo Contrast Result Date: 08/23/2023 CLINICAL DATA:  Neck trauma (Age >= 65y); Head trauma, minor (Age >= 65y). Altered mental status. EXAM: CT HEAD WITHOUT CONTRAST CT CERVICAL SPINE WITHOUT CONTRAST TECHNIQUE: Multidetector CT imaging of the head and cervical spine was performed following the standard protocol without intravenous contrast. Multiplanar CT image reconstructions of the cervical spine were also generated. RADIATION DOSE REDUCTION: This exam was performed according to the departmental dose-optimization program which includes automated exposure control, adjustment of the mA and/or kV according to patient size and/or use of iterative reconstruction technique. COMPARISON:  CT head 12/19/2022. MRI head 12/20/2022. MRI cervical spine 12/13/2020. FINDINGS: CT HEAD FINDINGS Brain: There is no evidence of an acute infarct, intracranial hemorrhage, mass, midline shift, or extra-axial fluid collection. There is mild cerebral atrophy. Cerebral white matter hypodensities are unchanged and nonspecific but compatible with mild chronic small vessel ischemic disease. Vascular: Extensive calcified atherosclerosis at the skull base. No acutely suspicious hyperdense vessel. Skull: No acute fracture or suspicious lesion. Sinuses/Orbits: The visualized mastoid air cells and paranasal sinuses are clear. Bilateral cataract extraction. Other: None. CT CERVICAL SPINE FINDINGS Alignment: Trace retrolisthesis of C3 on C4, unchanged. Skull base and vertebrae: No acute fracture. 1 cm lucent lesion in the posterior aspect of the C7 vertebral body on the left without a corresponding abnormality on the prior MRI. Scattered small Schmorl's nodes. Soft tissues and spinal canal: No prevertebral fluid or swelling. No visible canal hematoma. Disc levels: Moderate cervical disc degeneration with multilevel spinal stenosis,  moderate at C4-5. Mild-to-moderate multilevel neural foraminal stenosis. Upper chest: More fully evaluated on today's separately reported chest CT. Other: None. IMPRESSION: 1. No evidence of acute intracranial abnormality. 2. Mild chronic small vessel ischemic disease. 3. No acute cervical spine fracture or traumatic malalignment. 4. 1 cm lucent lesion in the C7 vertebral body, indeterminate but potentially new from 2022. Consider nonemergent cervical spine MRI without and with contrast for further evaluation. Electronically Signed   By: Aundra Lee M.D.   On: 08/23/2023 14:21   DG Chest 2 View Addendum Date: 08/23/2023 ADDENDUM REPORT: 08/23/2023 12:36 ADDENDUM: Small right apical pneumothorax. Finding better evaluated on CT chest 08/23/2023 Electronically Signed   By: Morgane  Naveau M.D.   On: 08/23/2023 12:36  Result Date: 08/23/2023 CLINICAL DATA:  Tachypnea EXAM: CHEST - 2 VIEW COMPARISON:  Chest x-ray 10/20/2022 FINDINGS: The heart and mediastinal contours are unchanged. Atherosclerotic plaque. Low lung volumes. Right base vague airspace opacity. No focal consolidation. No pulmonary edema. No pleural effusion. No pneumothorax. No acute osseous abnormality. IMPRESSION: 1. Low lung volumes with right base vague airspace opacity that could represent developing infection/inflammation versus atelectasis. Followup PA and lateral chest X-ray is recommended in 3-4 weeks following therapy to ensure resolution and exclude underlying malignancy. 2.  Aortic Atherosclerosis (ICD10-I70.0). Electronically Signed: By: Morgane  Naveau M.D. On: 08/23/2023 11:01   CT CHEST WO CONTRAST Result Date: 08/23/2023 CLINICAL DATA:  Chest wall pain, nontraumatic, infection or inflammation suspected, xray done EXAM: CT CHEST WITHOUT CONTRAST TECHNIQUE: Multidetector CT imaging of the chest was performed following the standard protocol without IV contrast. RADIATION DOSE REDUCTION: This exam was performed according to the  departmental dose-optimization program which includes automated exposure control, adjustment of the mA and/or kV according to patient size and/or use of iterative reconstruction technique. COMPARISON:  August 23, 2023, October 20, 2022 FINDINGS: Cardiovascular: No cardiomegaly or pericardial effusion. Fusiform dilation of the ascending aorta measuring 4.1 cm. Scattered atherosclerosis. Dense multi-vessel coronary atherosclerosis. Mediastinum/Nodes: No mediastinal mass. No mediastinal, hilar, or axillary lymphadenopathy. Lungs/Pleura: Slight rightward shifting of the trachea, which is otherwise patent. There is a small soft tissue nodule dependently within the upper trachea, measuring 1.5 cm. Multifocal subsegmental atelectasis in both lower lobes. No lobar consolidation. There is a trace left pleural effusion. Small volume right pneumothorax, measuring 2.4 cm at the lung base and 0.8 cm at the lung apex. This is estimated at 15% by volume. Musculoskeletal: No acute, displaced fracture or destructive bone lesion. Diffuse osteopenia. Multilevel degenerative disc disease of the spine. Upper Abdomen: No acute abnormality in the partially visualized upper abdomen. IMPRESSION: 1. Small volume right pneumothorax estimated at 15% by volume. No findings to suggest tension pathology. 2. Small soft tissue nodule dependently within the upper trachea, measuring 1.5 cm. Is uncertain if this is ingested material or aspirated debris or a tracheal polyp. Nonemergent pulmonology follow-up could be considered. 3. Trace left pleural effusion. Bibasilar subsegmental atelectasis. No lobar consolidation to suggest pneumonia. 4. Fusiform dilation of the ascending aorta measuring 4.1 cm. Recommend annual imaging followup by CTA or MRA. This recommendation follows 2010 ACCF/AHA/AATS/ACR/ASA/SCA/SCAI/SIR/STS/SVM Guidelines for the Diagnosis and Management of Patients with Thoracic Aortic Disease. Circulation. 2010; 121: Z610-R604. Aortic aneurysm  NOS (ICD10-I71.9) Aortic Atherosclerosis (ICD10-I70.0). Critical Value/emergent results were called by telephone at the time of interpretation on 08/23/2023 at 12:08 pm to provider Dr. Manus Sellers, who verbally acknowledged these results. Electronically Signed   By: Rance Burrows M.D.   On: 08/23/2023 12:30    Procedures Procedures    Medications Ordered in ED Medications  fentaNYL (SUBLIMAZE) injection 12.5 mcg (has no administration in time range)    ED Course/ Medical Decision Making/ A&P                                 Medical Decision Making Amount and/or Complexity of Data Reviewed Labs: ordered. Radiology: ordered.   This patient presents to the ED for concern of AMS, this involves an extensive number of treatment options, and is a complaint that carries with it a high risk of complications and morbidity.  The differential diagnosis includes UTI, cellulitis, sepsis, pneumonia, STEMI, NSTEMI   Co morbidities that complicate  the patient evaluation  Dementia, Parkinson's, UTI,   Additional history obtained:  Additional history obtained from family   Lab Tests:  I Ordered, and personally interpreted labs.  The pertinent results include: Elevated troponin at 47,43 , small hemoglobin, 20 ketones, 30 protein, rare bacteria, greater than 50 WBCs   Imaging Studies ordered:  I ordered imaging studies including chest x-ray, and chest CT I independently visualized and interpreted imaging which showed small pleural effusion and small to moderate pneumothorax I agree with the radiologist interpretation   Cardiac Monitoring: / EKG:  The patient was maintained on a cardiac monitor.  I personally viewed and interpreted the cardiac monitored which showed an underlying rhythm of: Sinus rhythm with left axis deviation   Problem List / ED Course / Critical interventions / Medication management  I ordered medication including Toradol for pain Reevaluation of the patient after  these medicines showed that the patient improved I have reviewed the patients home medicines and have made adjustments as needed   Consultations Obtained:  I requested consultation with the trauma surgery, Dr. Davonna Estes,  and discussed lab and imaging findings as well as pertinent plan - they stated they would come see the patient and no intervention at this time Hospitalist, Dr. Felipe Horton who was agreeable to admission for pneumothorax    Test / Admission - Considered:  Admission for pneumothorax         Final Clinical Impression(s) / ED Diagnoses Final diagnoses:  Pneumothorax, unspecified type  Fall, initial encounter    Rx / DC Orders ED Discharge Orders     None         Carie Charity, PA-C 08/23/23 1449    Tegeler, Marine Sia, MD 08/23/23 1559

## 2023-08-23 NOTE — ED Notes (Signed)
 PT OTF with transport to CT.

## 2023-08-23 NOTE — ED Notes (Signed)
 Patient provided ice pack for leg pain at this time.

## 2023-08-23 NOTE — ED Triage Notes (Signed)
 Pt from home with ems c.o decreased oral intake and decreased activity for the past week, pt not as active/talkative as he normally is. Pt warm to touch. Hx of UTI and dementia.

## 2023-08-23 NOTE — Consult Note (Signed)
 Travis [name] [birth year]  [id number].    Requesting MD: Tegeler Chief Complaint/Reason for Consult: Traumatic PTX  HPI:  89 y/o M w/ arthritis and Parkinson's who presented to the ED with decreased energy levels and AMS.  On examination it was determined that he had experienced a fall on Tuesday.  Unknown LOC.  CT was performed and showed a right-sided PTX measuring 15% by volume.  Trauma has been consulted to evaluate.  On exam, patient is resting comfortably. Somewhat lethargic. NAD.  Sating well on RA.  Family at bedside.   ROS: Not obtained due to patient condition   Family History  Problem Relation Age of Onset   Stroke Father    Diabetes Sister    Breast cancer Sister    Cancer Maternal Aunt    Hematuria Neg Hx    Renal cancer Neg Hx    Prostate cancer Neg Hx    Bladder Cancer Neg Hx     Past Medical History:  Diagnosis Date   Arthritis    Cancer (HCC)    BLADDER   Complication of anesthesia    had a hard time recovering from anesthesia after knee replacement neuro symptoms   Dementia (HCC)    Hyperlipidemia    Parkinson disease (HCC)     Past Surgical History:  Procedure Laterality Date   eye implants  1992   EYE SURGERY Bilateral    Cataract Extraction with IOL Implants   HERNIA REPAIR Right 1992   Inguinal Hernia Repair   INGUINAL HERNIA REPAIR Left 01/03/2016   Procedure: LAPAROSCOPIC INGUINAL HERNIA;  Surgeon: Alben Alma, MD;  Location: [facility] ORS;  Service: General;  Laterality: Left;   INGUINAL HERNIA REPAIR Left 06/01/2019   Procedure: HERNIA REPAIR INGUINAL ADULT;  Surgeon: Eldred Grego, MD;  Location: [facility] ORS;  Service: General;  Laterality: Left;   JOINT REPLACEMENT     TOTAL KNEE ARTHROPLASTY Left 03/18/2016   Procedure: TOTAL KNEE ARTHROPLASTY;  Surgeon: Molli Angelucci, MD;  Location: [facility] ORS;  Service: Orthopedics;  Laterality: Left;   TRANSURETHRAL RESECTION OF BLADDER TUMOR N/A 09/19/2016   Procedure: TRANSURETHRAL RESECTION OF  BLADDER TUMOR (TURBT) MEDIUM;  Surgeon: Bart Born, MD;  Location: [facility] ORS;  Service: Urology;  Laterality: N/A;    Social History:  reports that he has never smoked. He has never used smokeless tobacco. He reports that he does not drink alcohol and does not use drugs.  Allergies: No Known Allergies  (Not in a hospital admission)   Physical Exam: Blood pressure 104/79, pulse 84, temperature 98.7 F (37.1 C), temperature source Oral, resp. rate 17, height 5\' 9"  (1.753 m), weight 82.6 kg, SpO2 99%. Gen: male, NAD HEENT: atraumatic, trachea midline, no lacerations/abrasions Resp: equal chest rise, no TTP, no deformity, no crepitus, on RA CV: NSR Abd: soft, non-distended, non-tender Pelvis: no deformity, non-tender Back: non-tender Neuro: moving all extremities  Results for orders placed or performed during the hospital encounter of 08/23/23 (from the past 48 hours)  Comprehensive metabolic panel     Status: Abnormal   Collection Time: 08/23/23 11:02 AM  Result Value Ref Range   Sodium 139 135 - 145 mmol/L   Potassium 3.7 3.5 - 5.1 mmol/L   Chloride 102 98 - 111 mmol/L   CO2 24 22 - 32 mmol/L   Glucose, Bld 101 (H) 70 - 99 mg/dL    Comment: Glucose reference range applies only to samples taken after fasting for at least 8 hours.  BUN 14 8 - 23 mg/dL   Creatinine, Ser 1.61 0.61 - 1.24 mg/dL   Calcium 9.8 8.9 - 09.6 mg/dL   Total Protein 7.2 6.5 - 8.1 g/dL   Albumin 3.5 3.5 - 5.0 g/dL   AST 26 15 - 41 U/L   ALT 13 0 - 44 U/L   Alkaline Phosphatase 73 38 - 126 U/L   Total Bilirubin 0.8 0.0 - 1.2 mg/dL   GFR, Estimated >04 >54 mL/min    Comment: (NOTE) Calculated using the CKD-EPI Creatinine Equation (2021)    Anion gap 13 5 - 15    Comment: Performed at [facility], [address], [place], [place] [address]  CBC with Differential     Status: Abnormal   Collection Time: 08/23/23 11:02 AM  Result Value Ref Range   WBC 7.1 4.0 - 10.5 K/uL   RBC 5.20 4.22 -  5.81 MIL/uL   Hemoglobin 13.2 13.0 - 17.0 g/dL   HCT 91.4 78.2 - 95.6 %   MCV 79.6 (L) 80.0 - 100.0 fL   MCH 25.4 (L) 26.0 - 34.0 pg   MCHC 31.9 30.0 - 36.0 g/dL   RDW 21.3 08.6 - 57.8 %   Platelets 233 150 - 400 K/uL   nRBC 0.0 0.0 - 0.2 %   Neutrophils Relative % 67 %   Neutro Abs 4.7 1.7 - 7.7 K/uL   Lymphocytes Relative 20 %   Lymphs Abs 1.4 0.7 - 4.0 K/uL   Monocytes Relative 13 %   Monocytes Absolute 0.9 0.1 - 1.0 K/uL   Eosinophils Relative 0 %   Eosinophils Absolute 0.0 0.0 - 0.5 K/uL   Basophils Relative 0 %   Basophils Absolute 0.0 0.0 - 0.1 K/uL   Immature Granulocytes 0 %   Abs Immature Granulocytes 0.02 0.00 - 0.07 K/uL    Comment: Performed at [facility], [address] [place], [place] [address]  Troponin I (High Sensitivity)     Status: Abnormal   Collection Time: 08/23/23 11:02 AM  Result Value Ref Range   Troponin I (High Sensitivity) 47 (H) <18 ng/L    Comment: (NOTE) Elevated high sensitivity troponin I (hsTnI) values and significant  changes across serial measurements may suggest ACS but many other  chronic and acute conditions are known to elevate hsTnI results.  Refer to the "Links" section for chest pain algorithms and additional  guidance. Performed at [facility], [address], [place], [place] [address]    DG Chest 2 View Addendum Date: 08/23/2023 ADDENDUM REPORT: 08/23/2023 12:36 ADDENDUM: Small right apical pneumothorax. Finding better evaluated on CT chest 08/23/2023 Electronically Signed   By: Morgane  Naveau M.D.   On: 08/23/2023 12:36   Result Date: 08/23/2023 CLINICAL DATA:  Tachypnea EXAM: CHEST - 2 VIEW COMPARISON:  Chest x-ray 10/20/2022 FINDINGS: The heart and mediastinal contours are unchanged. Atherosclerotic plaque. Low lung volumes. Right base vague airspace opacity. No focal consolidation. No pulmonary edema. No pleural effusion. No pneumothorax. No acute osseous abnormality. IMPRESSION: 1. Low lung volumes with right  base vague airspace opacity that could represent developing infection/inflammation versus atelectasis. Followup PA and lateral chest X-ray is recommended in 3-4 weeks following therapy to ensure resolution and exclude underlying malignancy. 2.  Aortic Atherosclerosis (ICD10-I70.0). Electronically Signed: By: Morgane  Naveau M.D. On: 08/23/2023 11:01   CT CHEST WO CONTRAST Result Date: 08/23/2023 CLINICAL DATA:  Chest wall pain, nontraumatic, infection or inflammation suspected, xray done EXAM: CT CHEST  WITHOUT CONTRAST TECHNIQUE: Multidetector CT imaging of the chest was performed following the standard protocol without IV contrast. RADIATION DOSE REDUCTION: This exam was performed according to the departmental dose-optimization program which includes automated exposure control, adjustment of the mA and/or kV according to patient size and/or use of iterative reconstruction technique. COMPARISON:  August 23, 2023, October 20, 2022 FINDINGS: Cardiovascular: No cardiomegaly or pericardial effusion. Fusiform dilation of the ascending aorta measuring 4.1 cm. Scattered atherosclerosis. Dense multi-vessel coronary atherosclerosis. Mediastinum/Nodes: No mediastinal mass. No mediastinal, hilar, or axillary lymphadenopathy. Lungs/Pleura: Slight rightward shifting of the trachea, which is otherwise patent. There is a small soft tissue nodule dependently within the upper trachea, measuring 1.5 cm. Multifocal subsegmental atelectasis in both lower lobes. No lobar consolidation. There is a trace left pleural effusion. Small volume right pneumothorax, measuring 2.4 cm at the lung base and 0.8 cm at the lung apex. This is estimated at 15% by volume. Musculoskeletal: No acute, displaced fracture or destructive bone lesion. Diffuse osteopenia. Multilevel degenerative disc disease of the spine. Upper Abdomen: No acute abnormality in the partially visualized upper abdomen. IMPRESSION: 1. Small volume right pneumothorax estimated at 15%  by volume. No findings to suggest tension pathology. 2. Small soft tissue nodule dependently within the upper trachea, measuring 1.5 cm. Is uncertain if this is ingested material or aspirated debris or a tracheal polyp. Nonemergent pulmonology follow-up could be considered. 3. Trace left pleural effusion. Bibasilar subsegmental atelectasis. No lobar consolidation to suggest pneumonia. 4. Fusiform dilation of the ascending aorta measuring 4.1 cm. Recommend annual imaging followup by CTA or MRA. This recommendation follows 2010 ACCF/AHA/AATS/ACR/ASA/SCA/SCAI/SIR/STS/SVM Guidelines for the Diagnosis and Management of Patients with Thoracic Aortic Disease. Circulation. 2010; 121: Z610-R604. Aortic aneurysm NOS (ICD10-I71.9) Aortic Atherosclerosis (ICD10-I70.0). Critical Value/emergent results were called by telephone at the time of interpretation on 08/23/2023 at 12:08 pm to provider Dr. Manus Sellers, who verbally acknowledged these results. Electronically Signed   By: Rance Burrows M.D.   On: 08/23/2023 12:30    Assessment/Plan 89 y/o M w/ Parkinson's who presents several days after a fall  - No indication for urgent intervention for the patient's right-sided PTX - Admission to medicine - Will repeat CXR in AM - Monitor respiratory status - Trauma will follow   Trula [facility] Surgery 08/23/2023, 12:40 PM Please see Amion for pager number during day hours 7:00am-4:30pm or 7:00am -11:30am on weekends

## 2023-08-23 NOTE — Progress Notes (Signed)
 Transition of Care [facility]) - CAGE-AID Screening   Patient Details  Name: Travis [name] MRN: [id number] Date of Birth: Jun 21, 1931  [facility]) CM/SW Contact:    Brunetta Capes, RN Phone Number: 08/23/2023, 10:33 PM   Clinical Narrative:  Patient denies the use of alcohol and illicit substances. Resources not given at this time.  CAGE-AID Screening:    Have You Ever Felt You Ought to Cut Down on Your Drinking or Drug Use?: No Have People Annoyed You By Critizing Your Drinking Or Drug Use?: No Have You Felt Bad Or Guilty About Your Drinking Or Drug Use?: No Have You Ever Had a Drink or Used Drugs First Thing In The Morning to Steady Your Nerves or to Get Rid of a Hangover?: No CAGE-AID Score: 0  Substance Abuse Education Offered: No

## 2023-08-23 NOTE — H&P (Addendum)
 History and Physical    Patient: Travis [name] UJW:[id number] DOB: 04-Apr-1932 DOA: 08/23/2023 DOS: the patient was seen and examined on 08/23/2023 PCP: Jimmy Moulding, MD  Patient coming from: Home  Chief Complaint:  Chief Complaint  Patient presents with   Altered Mental Status   HPI: Travis [name] is a 89 y.o. male with medical history significant of hyperlipidemia, Parkinson's, history of bladder cancer s/p TURBT, arthritis, and BPH who presents due to decreased p.o. intake activity over the last week.  His daughter notes that he normally gets around with a walker, but has been complaining more of increasing right knee pain.  She has been trying to get him into the [place] for possible injection.  She reports that he had not been wanting to eat much anything and seemed to be getting weak to the point where he was needing assistance with eating.  He question if his Parkinson's medications were causing symptoms and so she held them.  Over the last day or so he is just lying in bed his speech seemed to be getting worse. 4 or 5 days ago he did not fall on his right side, but when she checked him out he did not seem to have sustained an injury.  At baseline patient is usually alert and oriented to his self and able to recognize family.  Over the last month she reports that he has had more episodes where he will be normal 1 minute and then he is working on his car asking for his keys the next.  There has been no reported fever, chest pain, nausea, vomiting, or diarrhea symptoms.   In the emergency department patient was noted to be afebrile with respirations 17-29, and all other vital signs maintained.  Labs noted hemoglobin 13.2, MCV 79.6, high-sensitivity troponin 47->43.  Urinalysis positive for small hemoglobin, 30 protein, specific gravity 1.026, rare bacteria present, 6-10 RBC/hpf, and greater than 50 RBCs.  Urine culture was obtained.  Chest x-ray was noted to be abnormal for which a CT scan  of the chest was obtained and revealed small volume right pneumothorax estimated at 15% by volume without findings to suggest tension physiology, and presence of a small soft tissue nodule within the upper trachea measuring 1.5 cm.  Trauma surgery have been consulted.  Patient had been given fentanyl 12.5 mg IV.  TRH called to admit for observation overnight.   Review of Systems: As mentioned in the history of present illness. All other systems reviewed and are negative. Past Medical History:  Diagnosis Date   Arthritis    Cancer (HCC)    BLADDER   Complication of anesthesia    had a hard time recovering from anesthesia after knee replacement neuro symptoms   Dementia (HCC)    Hyperlipidemia    Parkinson disease (HCC)    Past Surgical History:  Procedure Laterality Date   eye implants  1992   EYE SURGERY Bilateral    Cataract Extraction with IOL Implants   HERNIA REPAIR Right 1992   Inguinal Hernia Repair   INGUINAL HERNIA REPAIR Left 01/03/2016   Procedure: LAPAROSCOPIC INGUINAL HERNIA;  Surgeon: Alben Alma, MD;  Location: [facility] ORS;  Service: General;  Laterality: Left;   INGUINAL HERNIA REPAIR Left 06/01/2019   Procedure: HERNIA REPAIR INGUINAL ADULT;  Surgeon: Eldred Grego, MD;  Location: [facility] ORS;  Service: General;  Laterality: Left;   JOINT REPLACEMENT     TOTAL KNEE ARTHROPLASTY Left 03/18/2016   Procedure: TOTAL KNEE  ARTHROPLASTY;  Surgeon: Molli Angelucci, MD;  Location: [facility] ORS;  Service: Orthopedics;  Laterality: Left;   TRANSURETHRAL RESECTION OF BLADDER TUMOR N/A 09/19/2016   Procedure: TRANSURETHRAL RESECTION OF BLADDER TUMOR (TURBT) MEDIUM;  Surgeon: Bart Born, MD;  Location: [facility] ORS;  Service: Urology;  Laterality: N/A;   Social History:  reports that he has never smoked. He has never used smokeless tobacco. He reports that he does not drink alcohol and does not use drugs.  No Known Allergies  Family History  Problem Relation Age of Onset    Stroke Father    Diabetes Sister    Breast cancer Sister    Cancer Maternal Aunt    Hematuria Neg Hx    Renal cancer Neg Hx    Prostate cancer Neg Hx    Bladder Cancer Neg Hx     Prior to Admission medications   Medication Sig Start Date End Date Taking? Authorizing Provider  acetaminophen (TYLENOL) 650 MG CR tablet Take 1,300 mg by mouth daily as needed for pain.    [provider]  ascorbic acid (VITAMIN C) 500 MG tablet Take 1 tablet (500 mg total) by mouth daily. 10/24/22   Regalado, Belkys A, MD  cetirizine (ZYRTEC) 10 MG tablet Take 10 mg by mouth daily.    [provider]  darifenacin (ENABLEX) 7.5 MG 24 hr tablet Take 1 tablet (7.5 mg total) by mouth daily. 05/29/23   Christina Coyer, MD  finasteride (PROSCAR) 5 MG tablet Take 1 tablet (5 mg total) by mouth daily. 04/13/23   Christina Coyer, MD  furosemide (LASIX) 20 MG tablet Take 20 mg by mouth daily. Patient not taking: Reported on 04/13/2023 11/04/22 11/04/23  [provider]  pantoprazole (PROTONIX) 40 MG tablet Take 40 mg by mouth daily. 05/15/21   [provider]  tamsulosin (FLOMAX) 0.4 MG CAPS capsule Take 1 capsule (0.4 mg total) by mouth daily after supper. 11/24/22   Christina Coyer, MD  Vibegron (GEMTESA) 75 MG TABS Take 1 tablet (75 mg total) by mouth daily. 04/13/23   Christina Coyer, MD  vitamin B-12 (CYANOCOBALAMIN) 1000 MCG tablet Take 1,000 mcg by mouth daily.     [provider]  VITAMIN D PO Take 1 tablet by mouth daily.    [provider]  zinc sulfate 220 (50 Zn) MG capsule Take 1 capsule (220 mg total) by mouth daily. 10/24/22   Danette Duos, MD    Physical Exam: Vitals:   08/23/23 0841 08/23/23 0843 08/23/23 1218  BP:  121/85 104/79  Pulse:  74 84  Resp:  (!) 24 17  Temp:  97.7 F (36.5 C) 98.7 F (37.1 C)  TempSrc:  Axillary Oral  SpO2: 96% 96% 99%  Weight:  82.6 kg   Height:  5\' 9"  (1.753 m)     Constitutional: Elderly male who is  lethargic and not easily waking up to answer questions Eyes: PERRL, lids and conjunctivae normal ENMT: Mucous membranes are moist.  Fair dentition Neck: normal, supple, no masses, no thyromegaly Respiratory: clear to auscultation bilaterally, no wheezing, no crackles. Normal respiratory effort. No accessory muscle use.  Cardiovascular: Regular rate and rhythm, no murmurs / rubs / gallops. No extremity edema. 2+ pedal pulses. No carotid bruits.  Abdomen: no tenderness, no masses palpated.  Bowel sounds positive.  Musculoskeletal: no clubbing / cyanosis.  Significant right knee.  Normal muscle tone.  Skin: no rashes, lesions, ulcers.  Skin tenting present. Neurologic: CN 2-12 grossly intact.  Appears able to move all extremities.  Tremor present.  Speech is not clear at this time difficult to understand. Psychiatric: Lethargic, unable to assess for orientation due to cooperation  Data Reviewed:  EKG reveals sinus rhythm at 75 bpm with axis deviation.  Reviewed labs, imaging, and pertinent records as documented.  Assessment and Plan:  Acute metabolic encephalopathy Patient noted to be more lethargic and altered over the last week per daughter with decreased appetite.  CT scan of the head did not note any acute intercranial abnormality.  Urinalysis was noted to showed concern for possible infection with specific gravity at the upper limit of normal.  Symptoms may be multifactorial in nature in the setting of possible infection, dehydration, holding of Sinemet, and/or progression of dementia. - Admit to a medical telemetry bed - Delirium precautions - Neurochecks - Check respiratory virus panel - Normal saline 75 mL/h  Right pneumothorax secondary to fall Patient incidentally found to have a small right pneumothorax on CT without signs of tension physiology.  Trauma surgery had been consulted and recommended monitoring. - Nasal cannula oxygen - Incentive spirometry - Follow-up repeat chest  x-ray in a.m. - Appreciate trauma surgery consultative services, we will follow-up for any further recommendations  Elevated troponin High-sensitivity troponins were mildly elevated at 47->43.  Patient had not reportedly had any complaints of chest pain.  EKG without significant ischemic changes. - Continue to monitor  Possible urinary tract infection Present on admission.  Urinanalysis positive for small hemoglobin, right bacteria, 6-10 RBC/hpf, and greater than 50 WBCs.  Urine culture was obtained. - Follow-up urine culture - Rocephin IV  Gait disturbance Fall at home Patient normally ambulates with the use of a walker/cane, but reportedly had a fall earlier this week landing on his right side which may have led to the pneumothorax as noted above. -Up with assistance - PT/OT to eval and treat  Parkinson's disease with dementia Patient's daughter notes that she had held his Parkinson medication here recently and she question if it was making his symptoms worse.  At baseline patient is alert and oriented to self and normally is able to recognize family, but not always oriented to time and place. -Resume Sinemet  Iron deficiency anemia Daughter makes note patient also has history of iron deficiency anemia.  Patient hemoglobin appears to be within normal limits, but suspect this is secondary to hemoconcentration as the patient appears to be likely dehydrated. - Recheck CBC in a.m.  Abnormal CT of chest In addition to the pneumothorax patient was noted to have presence of a small soft tissue nodule within the upper trachea measuring 1.5 cms uncertain if this is ingested material or aspirated debris or a tracheal polyp.  - Daughter updated in regards to this finding and that nonemergent pulmonology follow-up could be considered.  Osteoarthritis of the right knee Daughter notes that the patient has been complaining more of right knee pain.  Known history of osteoarthritis of the right knee  but not a surgical candidate. - Voltaren gel to the right knee - [facility]  History of bladder cancer s/p TURBT BPH - Continue current medication regimen  DVT prophylaxis: Lovenox  Advance Care Planning:   Code Status: Full Code    Consults: Trauma surgery  Family Communication: Daughter as well as patient's sisters updated  Severity of Illness: The appropriate patient status for this patient is OBSERVATION. Observation status is judged to be reasonable and necessary in order to  provide the required intensity of service to ensure the patient's safety. The patient's presenting symptoms, physical exam findings, and initial radiographic and laboratory data in the context of their medical condition is felt to place them at decreased risk for further clinical deterioration. Furthermore, it is anticipated that the patient will be medically stable for discharge from the hospital within 2 midnights of admission.   Author: Lena Qualia, MD 08/23/2023 2:53 PM  For on call review [email or web address].

## 2023-08-24 ENCOUNTER — Observation Stay (HOSPITAL_COMMUNITY)

## 2023-08-24 DIAGNOSIS — Z833 Family history of diabetes mellitus: Secondary | ICD-10-CM | POA: Diagnosis not present

## 2023-08-24 DIAGNOSIS — Z823 Family history of stroke: Secondary | ICD-10-CM | POA: Diagnosis not present

## 2023-08-24 DIAGNOSIS — J939 Pneumothorax, unspecified: Secondary | ICD-10-CM | POA: Diagnosis present

## 2023-08-24 DIAGNOSIS — M1711 Unilateral primary osteoarthritis, right knee: Secondary | ICD-10-CM | POA: Diagnosis present

## 2023-08-24 DIAGNOSIS — Y92009 Unspecified place in unspecified non-institutional (private) residence as the place of occurrence of the external cause: Secondary | ICD-10-CM | POA: Diagnosis not present

## 2023-08-24 DIAGNOSIS — N390 Urinary tract infection, site not specified: Secondary | ICD-10-CM | POA: Diagnosis present

## 2023-08-24 DIAGNOSIS — D509 Iron deficiency anemia, unspecified: Secondary | ICD-10-CM | POA: Diagnosis present

## 2023-08-24 DIAGNOSIS — N401 Enlarged prostate with lower urinary tract symptoms: Secondary | ICD-10-CM | POA: Diagnosis present

## 2023-08-24 DIAGNOSIS — F0280 Dementia in other diseases classified elsewhere without behavioral disturbance: Secondary | ICD-10-CM | POA: Diagnosis present

## 2023-08-24 DIAGNOSIS — Z8551 Personal history of malignant neoplasm of bladder: Secondary | ICD-10-CM | POA: Diagnosis not present

## 2023-08-24 DIAGNOSIS — E785 Hyperlipidemia, unspecified: Secondary | ICD-10-CM | POA: Diagnosis present

## 2023-08-24 DIAGNOSIS — Z96652 Presence of left artificial knee joint: Secondary | ICD-10-CM | POA: Diagnosis present

## 2023-08-24 DIAGNOSIS — R6884 Jaw pain: Secondary | ICD-10-CM | POA: Diagnosis present

## 2023-08-24 DIAGNOSIS — S270XXA Traumatic pneumothorax, initial encounter: Secondary | ICD-10-CM | POA: Diagnosis present

## 2023-08-24 DIAGNOSIS — M25561 Pain in right knee: Secondary | ICD-10-CM | POA: Diagnosis present

## 2023-08-24 DIAGNOSIS — W1830XA Fall on same level, unspecified, initial encounter: Secondary | ICD-10-CM | POA: Diagnosis present

## 2023-08-24 DIAGNOSIS — G9341 Metabolic encephalopathy: Secondary | ICD-10-CM | POA: Diagnosis present

## 2023-08-24 DIAGNOSIS — N138 Other obstructive and reflux uropathy: Secondary | ICD-10-CM | POA: Diagnosis present

## 2023-08-24 DIAGNOSIS — R5383 Other fatigue: Secondary | ICD-10-CM | POA: Diagnosis present

## 2023-08-24 DIAGNOSIS — I77810 Thoracic aortic ectasia: Secondary | ICD-10-CM | POA: Diagnosis present

## 2023-08-24 DIAGNOSIS — L89152 Pressure ulcer of sacral region, stage 2: Secondary | ICD-10-CM | POA: Diagnosis present

## 2023-08-24 DIAGNOSIS — Z79899 Other long term (current) drug therapy: Secondary | ICD-10-CM | POA: Diagnosis not present

## 2023-08-24 DIAGNOSIS — L98429 Non-pressure chronic ulcer of back with unspecified severity: Secondary | ICD-10-CM | POA: Diagnosis present

## 2023-08-24 DIAGNOSIS — Z1152 Encounter for screening for COVID-19: Secondary | ICD-10-CM | POA: Diagnosis not present

## 2023-08-24 DIAGNOSIS — G20A1 Parkinson's disease without dyskinesia, without mention of fluctuations: Secondary | ICD-10-CM | POA: Diagnosis present

## 2023-08-24 DIAGNOSIS — I10 Essential (primary) hypertension: Secondary | ICD-10-CM | POA: Diagnosis present

## 2023-08-24 DIAGNOSIS — Z803 Family history of malignant neoplasm of breast: Secondary | ICD-10-CM | POA: Diagnosis not present

## 2023-08-24 LAB — CBC
HCT: 41.6 % (ref 39.0–52.0)
Hemoglobin: 13.1 g/dL (ref 13.0–17.0)
MCH: 24.7 pg — ABNORMAL LOW (ref 26.0–34.0)
MCHC: 31.5 g/dL (ref 30.0–36.0)
MCV: 78.3 fL — ABNORMAL LOW (ref 80.0–100.0)
Platelets: 261 10*3/uL (ref 150–400)
RBC: 5.31 MIL/uL (ref 4.22–5.81)
RDW: 14.9 % (ref 11.5–15.5)
WBC: 6.2 10*3/uL (ref 4.0–10.5)
nRBC: 0 % (ref 0.0–0.2)

## 2023-08-24 LAB — BASIC METABOLIC PANEL WITH GFR
Anion gap: 9 (ref 5–15)
BUN: 16 mg/dL (ref 8–23)
CO2: 29 mmol/L (ref 22–32)
Calcium: 9.8 mg/dL (ref 8.9–10.3)
Chloride: 103 mmol/L (ref 98–111)
Creatinine, Ser: 0.74 mg/dL (ref 0.61–1.24)
GFR, Estimated: >60 mL/min (ref >60)
Glucose, Bld: 113 mg/dL — ABNORMAL HIGH (ref 70–99)
Potassium: 3.6 mmol/L (ref 3.5–5.1)
Sodium: 141 mmol/L (ref 135–145)

## 2023-08-24 LAB — COMPREHENSIVE METABOLIC PANEL WITH GFR
ALT: 11 U/L (ref 0–44)
AST: 17 U/L (ref 15–41)
Albumin: 3.1 g/dL — ABNORMAL LOW (ref 3.5–5.0)
Alkaline Phosphatase: 69 U/L (ref 38–126)
Anion gap: 11 (ref 5–15)
BUN: 15 mg/dL (ref 8–23)
CO2: 28 mmol/L (ref 22–32)
Calcium: 9.4 mg/dL (ref 8.9–10.3)
Chloride: 99 mmol/L (ref 98–111)
Creatinine, Ser: 0.7 mg/dL (ref 0.61–1.24)
GFR, Estimated: >60 mL/min (ref >60)
Glucose, Bld: 119 mg/dL — ABNORMAL HIGH (ref 70–99)
Potassium: 3.5 mmol/L (ref 3.5–5.1)
Sodium: 138 mmol/L (ref 135–145)
Total Bilirubin: 0.8 mg/dL (ref 0.0–1.2)
Total Protein: 6.6 g/dL (ref 6.5–8.1)

## 2023-08-24 LAB — URINE CULTURE

## 2023-08-24 MED ORDER — METHYLPREDNISOLONE ACETATE 40 MG/ML IJ SUSP
40.0000 mg | Freq: Once | INTRAMUSCULAR | Status: AC
  Administered 2023-08-24: 40 mg via INTRA_ARTICULAR
  Filled 2023-08-24 (×2): qty 1

## 2023-08-24 MED ORDER — ENSURE ENLIVE PO LIQD
237.0000 mL | Freq: Two times a day (BID) | ORAL | Status: DC
  Administered 2023-08-24 – 2023-08-25 (×3): 237 mL via ORAL

## 2023-08-24 MED ORDER — BUPIVACAINE HCL (PF) 0.5 % IJ SOLN
10.0000 mL | Freq: Once | INTRAMUSCULAR | Status: AC
  Administered 2023-08-24: 10 mL
  Filled 2023-08-24: qty 10

## 2023-08-24 NOTE — Progress Notes (Signed)
 Patient ID: [id number] PAM [birth year]  Admit date: 08/23/2023 Admitting Diagnoses: Fall from standing R apical PTX  Tertiary Survey: Trauma scans reviewed: Yes Additional Imaging recommendations: Yes: CXR - final read is pending but R PTX appears stable to improved on preliminary evaluation Labs reviewed: Yes Additional lab work recommendations: No  Does the patient have any new complaints? No Cervical Collar Cleared: Yes DVT ppx ordered? Yes  Note: dosing for DVT prophylaxis in the setting of trauma and normal renal function is 30mg  BID or 40mg  BID if >100kg or BMI>35 Is patient age > 36 (89 y.o.) : Yes  Exam: General: pleasant, WD, elderly male who is laying in bed in NAD HEENT: head is normocephalic, atraumatic.   Heart: regular, rate, and rhythm.   Palpable radial and pedal pulses bilaterally Lungs: CTAB, no wheezes, rhonchi, or rales noted.  Respiratory effort nonlabored on room air Abd: soft, NT, ND MS: mild edema of R knee and pt reports TTP of R knee   Incidental Findings: possible UTI, tracheal polyp, fusiform dilation of ascending aorta  Wound Care: N/A  Follow-up Appointments: TBD  Frailty Score Trauma FrailtyTotal Score : .48  Will follow up results of CXR but if stable to improved would not recommend further CXR unless change in clinical status. IS as able although patient struggled to use this for me. No other recommendations from a trauma standpoint.   Annetta Killian, [facility] 08/24/2023, 3:31 PM Please see Amion for pager number during day hours 7:00am-4:30pm

## 2023-08-24 NOTE — Plan of Care (Signed)

## 2023-08-24 NOTE — Progress Notes (Signed)
 [facility] ROUNDING NOTE MIKAEL SKODA NWG:[id number]  DOB: [birth year]  DOA: 08/23/2023  PCP: Lauro Regulus, MD  08/24/2023,7:17 AM  LOS: 0 days    Code Status: Full code   from: Home current Dispo: Unclear   89 year old male Known arthritis status post knee arthroplasty 2017 BPH HTN TURBT 09/2016 with BCG induction for large 6 cm right bladder tumor cystoscopy benign 11/2022 maintenance HTN  2021 diagnosed with Parkinson's disease at [facility] followed by neurology at [place]-lower extremity edema previously managed with Lasix compression Bladder cancer with prior hospitalization 10/30/2022 with sepsis secondary to UTI with hematuria from bladder cancer-had COVID at the time also had free air under diaphragm but abdomen is benign no further workup or recommendations made patient.   brought from home via EMS 4/13 with weakness and decreased alertness as well as left-sided jaw pain and sacral  Ulcer-family reported not eating as much and needing assistance with eating also speech worsened also had a fall at home and intermittent confusion at times over the past month -WBC 7.1 hemoglobin 13.2 platelet 233 BUN/creatinine 14/0.8 respiratory viral panel negative Urine turbid small hemoglobin negative leukocytes did not comment on nitrates urine culture obtained in process CXR low lung volumes right vague basilar opacity?  Infection CT chest small volume pneumothorax 15% soft tissue nodule in upper trachea 1.5 cm?  Aspirated a tracheal polyp trace left pleural effusion fusiform dilatation of ascending aorta 4.1 cm CT head no intracranial abnormality 1 cm lucent lesion C7 new from 2022  Trauma surgery was consulted regarding pneumothorax  Plan  Recent fall in setting of worsening mentation Parkinson's disease Fall not related to Parkinson's-patient attempted to get out of bed by himself last week which she does not usually do and has a bad knee and fell down We will continue carbidopa 0.5 twice daily  for now.  He has historically had intolerance to Sinemet and unable to take the same at higher doses every time they escalate the dose--I have asked neurology to assess and see This may just be progression of underlying Parkinson's  Right pneumothorax,?  Vague right basilar opacity Unclear how occurred-no rib fractures-could be from mild trauma with relation to fall previously Repeat x-ray pending--he does have a low-grade fever I would escalate antibiotics if he continues to have temperature Appreciative of trauma input in advance  Tracheal polyp Outpatient follow-up with pulmonary as best determined  Osteoarthritis status post knee repair Severe osteoarthritis and discomfort in the right knee with recent fall Will ask orthopedics for opinion regarding injection, get knee x-ray 2 view  Bladder cancer status post TURBT Continues on Gemtesa 75 daily Flomax 0.4 daily Proscar 5 daily Do not suspect orthostasis or dysautonomia causing his  Elevated troponin Troponin is a gray zone-there are no ST-T wave changes across precordium--he is not having chest pain so would not workup further  ?  UTI Urine analysis did not show nitrates or leukocytes-if culture is negative tomorrow I would discontinue Rocephin that was started Patient's confusion could be related to UTI urine looks quite turbid  Fusiform dilatation of ascending aorta 4.1 Outpatient follow-up depending on goals of care   Discussed with daughter at the bedside who the patient lives with-patient has about 20 hours a week through the [place] for care and management He has been bedbound recently so he may need therapy eval although may likely have to go home with home health and get [facility] of [facility] to his care I will ask PT to  see  DVT prophylaxis: Lovenox  Status is: Observation The patient will require care spanning > 2 midnights and should be moved to inpatient because:   Requires multiple specialist  input  Subjective: Sleepy verbalizes some-no overt chest pain or fever He is able to tell me the date but his speech is somewhat garbled and not clear This seems to be about his baseline at times according to family   Objective + exam Vitals:   08/23/23 1930 08/23/23 2028 08/24/23 0314 08/24/23 0315  BP: 114/76 97/76 119/76 119/76  Pulse: 84 78 72 72  Resp: (!) 25 17 17 17   Temp:  98.5 F (36.9 C) (!) 100.9 F (38.3 C) (!) 100.9 F (38.3 C)  TempSrc:  Oral  Axillary  SpO2: 99% 99% 97% 97%  Weight:      Height:       Filed Weights   08/23/23 0843  Weight: 82.6 kg    Examination: Coherent some sleepy poor dentition well-built and nourished Chest is clear anteriorly-he was not able to sit up with me He does have tenderness at the right knee with flexion and movement Abdomen is soft no rebound no guarding Able to lift arms above bed Effort limits and moving his lower extremities Reflexes deferred  Data Reviewed: reviewed   CBC    Component Value Date/Time   WBC 6.2 08/24/2023 0210   RBC 5.31 08/24/2023 0210   HGB 13.1 08/24/2023 0210   HCT 41.6 08/24/2023 0210   PLT 261 08/24/2023 0210   MCV 78.3 (L) 08/24/2023 0210   MCH 24.7 (L) 08/24/2023 0210   MCHC 31.5 08/24/2023 0210   RDW 14.9 08/24/2023 0210   LYMPHSABS 1.4 08/23/2023 1102   MONOABS 0.9 08/23/2023 1102   EOSABS 0.0 08/23/2023 1102   BASOSABS 0.0 08/23/2023 1102      Latest Ref Rng & Units 08/24/2023    2:10 AM 08/23/2023   11:02 AM 12/19/2022    8:00 PM  CMP  Glucose 70 - 99 mg/dL 161  096  86   BUN 8 - 23 mg/dL 16  14  18    Creatinine 0.61 - 1.24 mg/dL 0.45  4.09  8.11   Sodium 135 - 145 mmol/L 141  139  139   Potassium 3.5 - 5.1 mmol/L 3.6  3.7  3.9   Chloride 98 - 111 mmol/L 103  102  103   CO2 22 - 32 mmol/L 29  24    Calcium 8.9 - 10.3 mg/dL 9.8  9.8    Total Protein 6.5 - 8.1 g/dL  7.2    Total Bilirubin 0.0 - 1.2 mg/dL  0.8    Alkaline Phos 38 - 126 U/L  73    AST 15 - 41 U/L  26     ALT 0 - 44 U/L  13      Scheduled Meds:  carbidopa-levodopa  0.5 tablet Oral BID   diclofenac Sodium  2 g Topical QID   enoxaparin (LOVENOX) injection  40 mg Subcutaneous Q24H   feeding supplement  237 mL Oral BID BM   finasteride  5 mg Oral Daily   loratadine  10 mg Oral Daily   mirabegron ER  25 mg Oral Daily   pantoprazole  40 mg Oral Daily   sodium chloride flush  3 mL Intravenous Q12H   tamsulosin  0.4 mg Oral QPC supper   Continuous Infusions:  cefTRIAXone (ROCEPHIN)  IV Stopped (08/23/23 1800)    Time 43  Jai-Gurmukh Conlan Miceli,  MD  [facility]

## 2023-08-24 NOTE — Progress Notes (Signed)
 RN called to assess pt for breathing tx.  Rt arrived to find pt sleeping with clear/diminished/rhonchi BS.  Pt seems to need to cough or be suctioned orally (RN stated she had done this), but no treatment at this time is necessary.  RT will continue to monitor.

## 2023-08-24 NOTE — Evaluation (Addendum)
 Occupational Therapy Evaluation Patient Details Name: DAEMON DOWTY MRN: [id number] DOB: [birth year] Today's Date: 08/24/2023   History of Present Illness   89 yo admitted with CT small volume right pneumothorax s/p fall and acute metabolic encephalopathy PMH HLD Parkinsons bladder CAs/p TURBT, arthritis BPH, dementia Eye implants, R hernia repair, L TKA     Clinical Impressions PT admitted with s/p fall with R PTx. Pt currently with functional limitiations due to the deficits listed below (see OT problem list). Pt at baseline requires (A) from family to get OOB and has not walked in one weeks time. Pt with poor intake of medications for Parkinsons for last 3 days prior to admission date per daughter. Pt at baseline only completes self feeding and dependent on family for all other adls.  Pt will benefit from skilled OT to increase their independence and safety with adls and balance to allow discharge skilled inpatient follow up therapy, <3 hours/day.  [facility] bed with [facility] mattress and will provide 20 hours of aid (A) starting 4/16. If [facility] could provide increased care and hoyer lift then d/c plans could be reconsidered pending conversation with the family. The pt has had family giving 24/7 care at home.       If plan is discharge home, recommend the following:   Two people to help with walking and/or transfers;Two people to help with bathing/dressing/bathroom     Functional Status Assessment   Patient has had a recent decline in their functional status and demonstrates the ability to make significant improvements in function in a reasonable and predictable amount of time.     Equipment Recommendations   [employer];Hospital bed;Wheelchair cushion (measurements OT);Wheelchair (measurements OT);BSC/3in1 (air mattress)     Recommendations for Other Services    Palliative consult      Precautions/Restrictions   Precautions Precautions:  Fall Restrictions Weight Bearing Restrictions Per Provider Order: No     Mobility Bed Mobility Overal bed mobility: Needs Assistance Bed Mobility: Rolling, Supine to Sit Rolling: Max assist   Supine to sit: +2 for safety/equipment, +2 for physical assistance, Max assist     General bed mobility comments: hob elevated exiting on L side. pt needs pad used and more of helicopter method    Transfers Overall transfer level: Needs assistance Equipment used: 2 person hand held assist, Rolling walker (2 wheels) Transfers: Sit to/from Stand Sit to Stand: +2 physical assistance, Total assist Stand pivot transfers: +2 physical assistance, Max assist, +2 safety/equipment, From elevated surface         General transfer comment: pt attempting to stand x2 prior to transfer to chair. pt requires pad to help with transfer. presented with RW to help with transfer and some better initiation but still requires therapists. pt requires therapists to pivot pt toward his L side. Recommend lift DME for safety for transfers      Balance Overall balance assessment: Needs assistance Sitting-balance support: Single extremity supported, Feet supported Sitting balance-Leahy Scale: Poor   Postural control: Left lateral lean Standing balance support: Bilateral upper extremity supported, During functional activity, Reliant on assistive device for balance Standing balance-Leahy Scale: Zero Standing balance comment: posterior bias                           ADL either performed or assessed with clinical judgement   ADL Overall ADL's : Needs assistance/impaired  General ADL Comments: baseline requires (A)     Vision   Vision Assessment?: No apparent visual deficits     Perception         Praxis         Pertinent Vitals/Pain Pain Assessment Pain Assessment: Faces Faces Pain Scale: Hurts even more Pain Location: knee Pain  Descriptors / Indicators: Grimacing, Discomfort Pain Intervention(s): Monitored during session, Premedicated before session, Repositioned, Limited activity within patient's tolerance     Extremity/Trunk Assessment Upper Extremity Assessment Upper Extremity Assessment: Generalized weakness;Right hand dominant   Lower Extremity Assessment Lower Extremity Assessment: Defer to PT evaluation;RLE deficits/detail RLE Deficits / Details: hx of R knee surg and pain   Cervical / Trunk Assessment Cervical / Trunk Assessment: Kyphotic   Communication Communication Communication: Impaired Factors Affecting Communication: Reduced clarity of speech   Cognition Arousal: Alert Behavior During Therapy: Flat affect Cognition: History of cognitive impairments                               Following commands: Intact       Cueing  General Comments   Cueing Techniques: Verbal cues;Gestural cues;Tactile cues;Visual cues  on RA   Exercises     Shoulder Instructions      Home Living Family/patient expects to be discharged to:: Private residence Living Arrangements: Children Available Help at Discharge: Family;Available 24 hours/day Type of Home: House Home Access: Stairs to enter [employer] of Steps: 2 Entrance Stairs-Rails: Right;Left;Can reach both Home Layout: One level     Bathroom Shower/Tub: Producer, television/film/video: Handicapped height     Home Equipment: Rollator (4 wheels);Transport chair;Grab bars - tub/shower;Hospital bed   Additional Comments: 20 hours consult of aide care through [place] is suppose to start 4/16 at 1pm, [place] suppose to deliver a hospital bed with air mattress      Prior Functioning/Environment Prior Level of Function : Needs assist       Physical Assist : Mobility (physical) Mobility (physical): Bed mobility;Transfers;Gait   Mobility Comments: household ambulator using RW, requires (A) to get normall ADLs Comments:  requires (A) for all adls except self feeding    OT Problem List: Decreased strength;Decreased activity tolerance;Impaired balance (sitting and/or standing);Decreased cognition;Decreased safety awareness;Decreased knowledge of use of DME or AE;Decreased knowledge of precautions   OT Treatment/Interventions: Self-care/ADL training;Therapeutic exercise;Neuromuscular education;Energy conservation;Therapeutic activities;Patient/family education;Balance training      OT Goals(Current goals can be found in the care plan section)   Acute Rehab OT Goals Patient Stated Goal: to get him stronger OT Goal Formulation: With patient Time For Goal Achievement: 09/07/23 Potential to Achieve Goals: Good   OT Frequency:  Min 1X/week    Co-evaluation PT/OT/SLP Co-Evaluation/Treatment: Yes Reason for Co-Treatment: Complexity of the patient's impairments (multi-system involvement);Necessary to address cognition/behavior during functional activity;For patient/therapist safety;To address functional/ADL transfers   OT goals addressed during session: ADL's and self-care;Proper use of Adaptive equipment and DME;Strengthening/ROM      AM-PAC OT "6 Clicks" Daily Activity     Outcome Measure Help from another person eating meals?: A Little Help from another person taking care of personal grooming?: A Little Help from another person toileting, which includes using toliet, bedpan, or urinal?: A Lot Help from another person bathing (including washing, rinsing, drying)?: A Lot Help from another person to put on and taking off regular upper body clothing?: A Lot Help from another person to put on and taking off  regular lower body clothing?: A Lot 6 Click Score: 14   End of Session Equipment Utilized During Treatment: Gait belt;Rolling walker (2 wheels) Nurse Communication: Mobility status;Precautions;Need for lift equipment  Activity Tolerance: Patient tolerated treatment well Patient left: in chair;with call  bell/phone within reach;with chair alarm set;with family/visitor present  OT Visit Diagnosis: Unsteadiness on feet (R26.81);Muscle weakness (generalized) (M62.81)                Time: 8295-6213 OT Time Calculation (min): 39 min Charges:  OT General Charges $OT Visit: 1 Visit OT Evaluation $OT Eval Moderate Complexity: 1 Mod   Brynn, OTR/L  Acute Rehabilitation Services Office: [phone] .   Neomia Banner 08/24/2023, 1:52 PM

## 2023-08-24 NOTE — Procedures (Signed)
 Procedure: Right knee aspiration and injection   Indication: Right knee effusion(s)   Surgeon: Starr Eddy, PA-C   Assist: None   Anesthesia: Topical refrigerant   EBL: None   Complications: None   Findings: After risks/benefits explained family desires to undergo procedure. Consent obtained and time out performed. The right knee was sterilely prepped and aspirated. Dry tap. 6ml 0.5% Marcaine and 40mg  depomedrol instilled easily. Pt tolerated the procedure well.       Travis Kin, PA-C Orthopedic Surgery [phone]

## 2023-08-24 NOTE — Evaluation (Signed)
 Physical Therapy Evaluation Patient Details Name: Travis [name] MRN: [id number] DOB: Feb 15, 1932 Today's Date: 08/24/2023  History of Present Illness  89 yo admitted with CT small volume right pneumothorax s/p fall and acute metabolic encephalopathy PMH HLD Parkinsons bladder CAs/p TURBT, arthritis BPH, dementia Eye implants, R hernia repair, L TKA  Clinical Impression  PTA, pt lives with his daughter and typically is ambulatory with a RW, however, has been unable to get out of bed for the past week. Pt with poor intake of Parkinson's medications for 3 days prior to admission per daughter. Pt presents with decreased functional mobility secondary to retropulsion, generalized weakness, R knee pain, decreased ROM, poor sitting/standing balance and inability to ambulate. Pt requiring two person assist to stand from edge of bed and transfer to chair. Patient will benefit from continued inpatient follow up therapy, <3 hours/day in order to address deficits and maximize functional mobility.   Per pt daughter, [place] is pending delivery of hospital bed with air mattress and providing 20 hours of aide starting 4/16. If [facility] could provide increased care and hoyer lift, then there is potential to d/c home at that level.      If plan is discharge home, recommend the following: Two people to help with walking and/or transfers;Two people to help with bathing/dressing/bathroom   Can travel by private vehicle   No    Equipment Recommendations Alen Amy lift;BSC/3in1  Recommendations for Other Services       Functional Status Assessment Patient has had a recent decline in their functional status and demonstrates the ability to make significant improvements in function in a reasonable and predictable amount of time.     Precautions / Restrictions Precautions Precautions: Fall Restrictions Weight Bearing Restrictions Per Provider Order: No      Mobility  Bed Mobility Overal bed mobility: Needs  Assistance Bed Mobility: Rolling, Supine to Sit Rolling: Max assist   Supine to sit: +2 for safety/equipment, +2 for physical assistance, Max assist     General bed mobility comments: hob elevated exiting on L side. pt needs pad used and more of helicopter method    Transfers Overall transfer level: Needs assistance Equipment used: 2 person hand held assist, Rolling walker (2 wheels) Transfers: Sit to/from Stand Sit to Stand: +2 physical assistance, Total assist Stand pivot transfers: +2 physical assistance, Max assist, +2 safety/equipment, From elevated surface         General transfer comment: pt attempting to stand x2 prior to transfer to chair. pt requires pad to help with transfer. presented with RW to help with transfer and some better initiation but still requires therapists. pt requires therapists to pivot pt toward his L side. Recommend lift DME for safety for transfers    Ambulation/Gait                  Stairs            Wheelchair Mobility     Tilt Bed    Modified Rankin (Stroke Patients Only)       Balance Overall balance assessment: Needs assistance Sitting-balance support: Single extremity supported, Feet supported Sitting balance-Leahy Scale: Poor   Postural control: Left lateral lean Standing balance support: Bilateral upper extremity supported, During functional activity, Reliant on assistive device for balance Standing balance-Leahy Scale: Zero Standing balance comment: posterior bias                             Pertinent  Vitals/Pain Pain Assessment Pain Assessment: Faces Faces Pain Scale: Hurts even more Pain Location: R knee Pain Descriptors / Indicators: Grimacing, Discomfort Pain Intervention(s): Limited activity within patient's tolerance, Monitored during session    Home Living Family/patient expects to be discharged to:: Private residence Living Arrangements: Children Available Help at Discharge:  Family;Available 24 hours/day Type of Home: House Home Access: Stairs to enter Entrance Stairs-Rails: Right;Left;Can reach both Entrance Stairs-Number of Steps: 2   Home Layout: One level Home Equipment: Rollator (4 wheels);Transport chair;Grab bars - tub/shower;Hospital bed Additional Comments: 20 hours consult of aide care through [place] is suppose to start 4/16 at 1pm, [place] suppose to deliver a hospital bed with air mattress    Prior Function Prior Level of Function : Needs assist       Physical Assist : Mobility (physical) Mobility (physical): Bed mobility;Transfers;Gait   Mobility Comments: household ambulator using RW, requires (A) to get normall ADLs Comments: requires (A) for all adls except self feeding     Extremity/Trunk Assessment   Upper Extremity Assessment Upper Extremity Assessment: Generalized weakness    Lower Extremity Assessment Lower Extremity Assessment: Generalized weakness RLE Deficits / Details: hx of R knee surg and pain    Cervical / Trunk Assessment Cervical / Trunk Assessment: Kyphotic  Communication   Communication Communication: Impaired Factors Affecting Communication: Reduced clarity of speech    Cognition Arousal: Alert Behavior During Therapy: Flat affect   PT - Cognitive impairments: History of cognitive impairments                       PT - Cognition Comments: Follows 1 step commands inconsistently Following commands: Intact       Cueing Cueing Techniques: Verbal cues, Gestural cues, Tactile cues, Visual cues     General Comments      Exercises     Assessment/Plan    PT Assessment Patient needs continued PT services  PT Problem List Decreased strength;Decreased activity tolerance;Decreased balance;Decreased mobility;Decreased cognition;Decreased safety awareness;Pain       PT Treatment Interventions Gait training;DME instruction;Functional mobility training;Therapeutic activities;Therapeutic exercise;Balance  training;Patient/family education    PT Goals (Current goals can be found in the Care Plan section)  Acute Rehab PT Goals Patient Stated Goal: pt daughter would like him to return to walking PT Goal Formulation: With patient/family Time For Goal Achievement: 09/07/23 Potential to Achieve Goals: Fair    Frequency Min 2X/week     Co-evaluation PT/OT/SLP Co-Evaluation/Treatment: Yes Reason for Co-Treatment: Complexity of the patient's impairments (multi-system involvement);Necessary to address cognition/behavior during functional activity;For patient/therapist safety;To address functional/ADL transfers PT goals addressed during session: Mobility/safety with mobility OT goals addressed during session: ADL's and self-care;Proper use of Adaptive equipment and DME;Strengthening/ROM       AM-PAC PT "6 Clicks" Mobility  Outcome Measure Help needed turning from your back to your side while in a flat bed without using bedrails?: A Lot Help needed moving from lying on your back to sitting on the side of a flat bed without using bedrails?: Total Help needed moving to and from a bed to a chair (including a wheelchair)?: Total Help needed standing up from a chair using your arms (e.g., wheelchair or bedside chair)?: Total Help needed to walk in hospital room?: Total Help needed climbing 3-5 steps with a railing? : Total 6 Click Score: 7    End of Session Equipment Utilized During Treatment: Gait belt Activity Tolerance: Patient tolerated treatment well Patient left: in chair;with call bell/phone within reach;with  chair alarm set Nurse Communication: Mobility status PT Visit Diagnosis: Unsteadiness on feet (R26.81);History of falling (Z91.81);Difficulty in walking, not elsewhere classified (R26.2)    Time: 1110-1147 PT Time Calculation (min) (ACUTE ONLY): 37 min   Charges:   PT Evaluation $PT Eval Moderate Complexity: 1 Mod   PT General Charges $$ ACUTE PT VISIT: 1 Visit          Verdia Glad, PT, DPT Acute Rehabilitation Services Office [phone]   Claria Crofts 08/24/2023, 3:38 PM

## 2023-08-24 NOTE — Progress Notes (Addendum)
 I am asked to comment on possible medication adjustment for this patient with Parkinson's disease followed by [facility] clinic (Dr. Walden Guise).  Titration of Sinemet outpatient has been limited by side effects.  Now admitted for fall, felt to be at least partly secondary to knee osteoarthritis and also noted to have a sacral ulcer, jaw pain and concern for UTI (although UA negative for nitrates/leukocytes)  Per last office note 06/01/2023 patient was having wearing off, lower extremity edema, and significant pain  Of note, Parkinson's symptoms often do worsen in the setting of inpatient hospitalization and this is not the ideal setting to titrate to medications, given that environment is already highly deliriogenic  Recommend continued outpatient follow-up but neurology is available if any acute inpatient neurological concerns arise  If there is no other source of his fever, and if he seems uncharacteristically rigid on examination would also check CK, but given lack of tachycardia, hypertension, overall does not seem highly concerning for NMS on chart review -- would review risk of neuroleptic malignant syndrome with family of medications for Parkinson's are abruptly discontinued in the future  8 min spent in care, discussed with Dr. Haywood Lisle via phone who consulted me at family's request  These are curbside recommendations based upon the information readily available in the chart on brief review as well as history and examination information provided to me by requesting provider and do not replace a full detailed consult

## 2023-08-25 ENCOUNTER — Inpatient Hospital Stay (HOSPITAL_COMMUNITY)

## 2023-08-25 DIAGNOSIS — G9341 Metabolic encephalopathy: Secondary | ICD-10-CM | POA: Diagnosis not present

## 2023-08-25 LAB — CBC
HCT: 39.6 % (ref 39.0–52.0)
Hemoglobin: 12.7 g/dL — ABNORMAL LOW (ref 13.0–17.0)
MCH: 25 pg — ABNORMAL LOW (ref 26.0–34.0)
MCHC: 32.1 g/dL (ref 30.0–36.0)
MCV: 78 fL — ABNORMAL LOW (ref 80.0–100.0)
Platelets: 253 10*3/uL (ref 150–400)
RBC: 5.08 MIL/uL (ref 4.22–5.81)
RDW: 14.6 % (ref 11.5–15.5)
WBC: 6.5 10*3/uL (ref 4.0–10.5)
nRBC: 0 % (ref 0.0–0.2)

## 2023-08-25 NOTE — Plan of Care (Signed)
  Problem: Clinical Measurements: Goal: Will remain free from infection Outcome: Progressing   Problem: Activity: Goal: Risk for activity intolerance will decrease Outcome: Progressing   Problem: Nutrition: Goal: Adequate nutrition will be maintained Outcome: Progressing   Problem: Elimination: Goal: Will not experience complications related to urinary retention Outcome: Progressing   Problem: Pain Managment: Goal: General experience of comfort will improve and/or be controlled Outcome: Progressing   Problem: Skin Integrity: Goal: Risk for impaired skin integrity will decrease Outcome: Progressing

## 2023-08-25 NOTE — Progress Notes (Signed)
 TRH ROUNDING NOTE LEEROY LOVINGS ZOX:[id number]  DOB: Apr 19, 1932  DOA: 08/23/2023  PCP: Lauro Regulus, MD  08/25/2023,11:06 AM  LOS: 1 day    Code Status: Full code   from: Home current Dispo: Unclear   89 year old male Known arthritis status post knee arthroplasty 2017 BPH HTN TURBT 09/2016 with BCG induction for large 6 cm right bladder tumor cystoscopy benign 11/2022 maintenance HTN  2021 diagnosed with Parkinson's disease at [facility] followed by neurology at [place]-lower extremity edema previously managed with Lasix compression Bladder cancer with prior hospitalization 10/30/2022 with sepsis secondary to UTI with hematuria from bladder cancer-had COVID at the time also had free air under diaphragm but abdomen is benign no further workup or recommendations made patient.   brought from home via EMS 4/13 with weakness and decreased alertness as well as left-sided jaw pain and sacral  Ulcer-family reported not eating as much and needing assistance with eating also speech worsened also had a fall at home and intermittent confusion at times over the past month -WBC 7.1 hemoglobin 13.2 platelet 233 BUN/creatinine 14/0.8 respiratory viral panel negative Urine turbid small hemoglobin negative leukocytes did not comment on nitrates urine culture obtained in process CXR low lung volumes right vague basilar opacity?  Infection CT chest small volume pneumothorax 15% soft tissue nodule in upper trachea 1.5 cm?  Aspirated a tracheal polyp trace left pleural effusion fusiform dilatation of ascending aorta 4.1 cm CT head no intracranial abnormality 1 cm lucent lesion C7 new from 2022  Trauma surgery was consulted regarding pneumothorax-signed off Orthopedics consulted regarding x-ray showing tricompartmental degenerative changes  Plan  Recent fall in setting of worsening mentation Parkinson's disease Fall not related to Parkinson's-patient attempted to get out of bed by himself last week which she  does not usually do and has a bad knee and fell down continue carbidopa 0.5 twice daily for now.  Historically unable to titrate secondary to side effects He will need outpatient neurology follow-up with Dr. Malvin Johns at [facility] neurologist here does not feel adding medications would be helpful or meaningful and this may be progression of his worsening mentation from underlying Parkinson's require skilled placement  Right pneumothorax,?  Vague right basilar opacity Unclear how occurred-no rib fractures-could be from mild trauma with relation to fall previously Repeat x-ray atelectasis unchanged pneumothorax-had low-grade fever on 4/14-now resolved  ?  UTI discontinue antibiotics--Urine analysis no nitrites leukocytes, urine culture 4/13 was negative- -urine is still turbid-needs outpatient urologist follow-up-no workup inpatient expected  Tracheal polyp Outpatient follow-up with pulmonary as best determined  Osteoarthritis status post knee repair Severe osteoarthritis and discomfort in the right knee with recent fall Appreciate orthopedics input  Bladder cancer status post TURBT Continues on Gemtesa 75 daily Flomax 0.4 daily Proscar 5 daily Do not suspect orthostasis or dysautonomia causing his  Elevated troponin Troponin is a gray zone-there are no ST-T wave changes across precordium--no chest pain so would not workup further  Fusiform dilatation of ascending aorta 4.1 Outpatient follow-up depending on goals of care   Discussed with daughter at the bedside who the patient lives with-patient has about 20 hours a week through the [place] for care and management Therapy recommends skilled expecting discharge in 1 to 2 days May need outpatient goals of care if fails to improve after following with neurology in the outpatient setting  DVT prophylaxis: Lovenox  Status is: Observation The patient will require care spanning > 2 midnights and should be moved to inpatient because:  Requires multiple specialist input  Subjective:  Sleepy but this is his baseline until usually early 11 AM Was more coherent this morning Urine still turbid family concerned No chest pain Overall looks unchanged from yesterday  Objective + exam Vitals:   08/24/23 2010 08/24/23 2306 08/25/23 0336 08/25/23 0926  BP: (!) 130/119 108/73 116/76 130/78  Pulse: 83 67 70 74  Resp:    17  Temp: 98.9 F (37.2 C)  98.5 F (36.9 C) 98.1 F (36.7 C)  TempSrc: Oral  Oral   SpO2: 96%  96% 100%  Weight:      Height:       Filed Weights   08/23/23 0843  Weight: 82.6 kg    Examination:  Sleepy arouses a little bit No icterus no pallor Moderate dentition S1-S2 no murmur Abdomen is soft no rebound PureWick in place he has some loose stool additionally He is quite tender in his right knee but swelling is diminished   Data Reviewed: reviewed   CBC    Component Value Date/Time   WBC 6.5 08/25/2023 0246   RBC 5.08 08/25/2023 0246   HGB 12.7 (L) 08/25/2023 0246   HCT 39.6 08/25/2023 0246   PLT 253 08/25/2023 0246   MCV 78.0 (L) 08/25/2023 0246   MCH 25.0 (L) 08/25/2023 0246   MCHC 32.1 08/25/2023 0246   RDW 14.6 08/25/2023 0246   LYMPHSABS 1.4 08/23/2023 1102   MONOABS 0.9 08/23/2023 1102   EOSABS 0.0 08/23/2023 1102   BASOSABS 0.0 08/23/2023 1102      Latest Ref Rng & Units 08/24/2023    7:58 PM 08/24/2023    2:10 AM 08/23/2023   11:02 AM  CMP  Glucose 70 - 99 mg/dL 409  811  914   BUN 8 - 23 mg/dL 15  16  14    Creatinine 0.61 - 1.24 mg/dL 7.82  9.56  2.13   Sodium 135 - 145 mmol/L 138  141  139   Potassium 3.5 - 5.1 mmol/L 3.5  3.6  3.7   Chloride 98 - 111 mmol/L 99  103  102   CO2 22 - 32 mmol/L 28  29  24    Calcium 8.9 - 10.3 mg/dL 9.4  9.8  9.8   Total Protein 6.5 - 8.1 g/dL 6.6   7.2   Total Bilirubin 0.0 - 1.2 mg/dL 0.8   0.8   Alkaline Phos 38 - 126 U/L 69   73   AST 15 - 41 U/L 17   26   ALT 0 - 44 U/L 11   13     Scheduled Meds:  carbidopa-levodopa   0.5 tablet Oral BID   diclofenac Sodium  2 g Topical QID   enoxaparin (LOVENOX) injection  40 mg Subcutaneous Q24H   finasteride  5 mg Oral Daily   loratadine  10 mg Oral Daily   mirabegron ER  25 mg Oral Daily   pantoprazole  40 mg Oral Daily   sodium chloride flush  3 mL Intravenous Q12H   tamsulosin  0.4 mg Oral QPC supper   Continuous Infusions:  cefTRIAXone (ROCEPHIN)  IV 1 g (08/24/23 1649)    Time 23  Verlie Glisson, MD  [facility]

## 2023-08-25 NOTE — TOC Progression Note (Signed)
 Transition of Care [facility]) - Progression Note    Patient Details  Name: Travis [name] MRN: [id number] Date of Birth: 08-12-1931  [facility] CM/SW Contact  Katrinka Parr, [place] Phone Number: 08/25/2023, 1:33 PM  Clinical Narrative:     CSW spoke with pt's daughter over the phone. Explained SNF recommendation and w/u process. Pt has been to SNF before in the past in [place]. Pt currently living with daughter temporarily so she is interested in facilities in [place] area. Fl2 completed and bed requests sent in hub.      Social Determinants of Health (SDOH) Interventions SDOH Screenings   Food Insecurity: No Food Insecurity (08/23/2023)  Housing: Low Risk  (08/23/2023)  Transportation Needs: No Transportation Needs (08/23/2023)  Utilities: Not At Risk (08/23/2023)  Financial Resource Strain: Low Risk  (03/13/2023)   Received from [facility]  Social Connections: Socially Isolated (08/23/2023)  Tobacco Use: Low Risk  (08/23/2023)    Readmission Risk Interventions     No data to display

## 2023-08-25 NOTE — NC FL2 (Signed)
  MEDICAID FL2 LEVEL OF CARE FORM     IDENTIFICATION  Patient Name: Travis [name] Birthdate: 07-18-1931 Sex: male Admission Date (Current Location): 08/23/2023  [facility] and [place] Number:  Producer, television/film/video and Address:  [facility], [address] [place], [place] [address]      Provider Number: [id number]  Attending Physician Name and Address:  Rhetta Mura, MD  Relative Name and Phone Number:  Daughter Stormy Card    Current Level of Care: Hospital Recommended Level of Care: Skilled Nursing Facility Prior Approval Number:    Date Approved/Denied:   PASRR Number: [id number]  Discharge Plan:      Current Diagnoses: Patient Active Problem List   Diagnosis Date Noted   Pneumothorax 08/23/2023   Acute metabolic encephalopathy 08/23/2023   Elevated troponin 08/23/2023   Urinary tract infection 08/23/2023   Gait disturbance 08/23/2023   Fall at home, initial encounter 08/23/2023   Parkinson's disease dementia (HCC) 08/23/2023   Iron deficiency anemia 08/23/2023   Abnormal CT of the chest 08/23/2023   COVID-19 virus infection 10/20/2022   Bladder cancer (HCC) 10/20/2022   Prediabetes 10/31/2019   Sensory polyneuropathy 07/12/2019   Tremor 05/24/2019   Essential tremor 04/29/2016   Primary osteoarthritis of left knee 03/18/2016   Hernia of abdominal cavity    B12 deficiency 10/23/2015   Difficulty walking 10/16/2015   Left knee pain 10/16/2015   MCI (mild cognitive impairment) 10/16/2015   BPH with obstruction/lower urinary tract symptoms 01/01/2014   HTN (hypertension), benign 01/01/2014   Hypercholesterolemia 01/01/2014   Osteoarthritis of knee 01/01/2014    Orientation RESPIRATION BLADDER Height & Weight     Self, Place  Normal Incontinent Weight: 182 lb (82.6 kg) Height:  5\' 9"  (175.3 cm)  BEHAVIORAL SYMPTOMS/MOOD NEUROLOGICAL BOWEL NUTRITION STATUS      Continent Diet (see d/c summary)  AMBULATORY STATUS  COMMUNICATION OF NEEDS Skin   Extensive Assist Verbally Normal                       Personal Care Assistance Level of Assistance  Bathing, Feeding, Dressing Bathing Assistance: Maximum assistance   Dressing Assistance: Maximum assistance     Functional Limitations Info  Sight, Hearing, Speech Sight Info: Impaired Hearing Info: Adequate Speech Info: Adequate    SPECIAL CARE FACTORS FREQUENCY  OT (By licensed OT), PT (By licensed PT)     PT Frequency: 5x/week OT Frequency: 5x/week            Contractures Contractures Info: Not present    Additional Factors Info  Code Status, Allergies Code Status Info: full code Allergies Info: no known allergies           Current Medications (08/25/2023):  This is the current hospital active medication list Current Facility-Administered Medications  Medication Dose Route Frequency Provider Last Rate Last Admin   acetaminophen (TYLENOL) tablet 650 mg  650 mg Oral Q6H PRN Clydie Braun, MD   650 mg at 08/24/23 1820   Or   acetaminophen (TYLENOL) suppository 650 mg  650 mg Rectal Q6H PRN Madelyn Flavors A, MD       albuterol (PROVENTIL) (2.5 MG/3ML) 0.083% nebulizer solution 2.5 mg  2.5 mg Nebulization Q6H PRN Madelyn Flavors A, MD       carbidopa-levodopa (SINEMET IR) 25-100 MG per tablet immediate release 0.5 tablet  0.5 tablet Oral BID Madelyn Flavors A, MD   0.5 tablet at 08/24/23 2030   cefTRIAXone (  ROCEPHIN) 1 g in sodium chloride 0.9 % 100 mL IVPB  1 g Intravenous Q24H Smith, Rondell A, MD 200 mL/hr at 08/24/23 1649 1 g at 08/24/23 1649   diclofenac Sodium (VOLTAREN) 1 % topical gel 2 g  2 g Topical QID Smith, Rondell A, MD   2 g at 08/24/23 2031   enoxaparin (LOVENOX) injection 40 mg  40 mg Subcutaneous Q24H Smith, Rondell A, MD   40 mg at 08/24/23 2030   feeding supplement (ENSURE ENLIVE / ENSURE PLUS) liquid 237 mL  237 mL Oral BID BM Smith, Rondell A, MD   237 mL at 08/24/23 1428   finasteride (PROSCAR) tablet 5 mg  5 mg  Oral Daily Smith, Rondell A, MD   5 mg at 08/24/23 1110   loratadine (CLARITIN) tablet 10 mg  10 mg Oral Daily Smith, Rondell A, MD   10 mg at 08/24/23 1110   mirabegron ER (MYRBETRIQ) tablet 25 mg  25 mg Oral Daily Smith, Rondell A, MD   25 mg at 08/24/23 1110   pantoprazole (PROTONIX) EC tablet 40 mg  40 mg Oral Daily Smith, Rondell A, MD   40 mg at 08/24/23 1110   sodium chloride flush (NS) 0.9 % injection 3 mL  3 mL Intravenous Q12H Smith, Rondell A, MD   3 mL at 08/24/23 2031   tamsulosin (FLOMAX) capsule 0.4 mg  0.4 mg Oral QPC supper Smith, Rondell A, MD   0.4 mg at 08/24/23 1701     Discharge Medications: Please see discharge summary for a list of discharge medications.  Relevant Imaging Results:  Relevant Lab Results:   Additional Information SSN: [id number] [place], [place]

## 2023-08-25 NOTE — Plan of Care (Signed)
   Problem: Education: Goal: Knowledge of General Education information will improve Description: Including pain rating scale, medication(s)/side effects and non-pharmacologic comfort measures Outcome: Progressing   Problem: Pain Managment: Goal: General experience of comfort will improve and/or be controlled Outcome: Progressing   Problem: Safety: Goal: Ability to remain free from injury will improve Outcome: Progressing

## 2023-08-26 DIAGNOSIS — G9341 Metabolic encephalopathy: Secondary | ICD-10-CM | POA: Diagnosis not present

## 2023-08-26 LAB — CBC WITH DIFFERENTIAL/PLATELET
Abs Immature Granulocytes: 0.01 10*3/uL (ref 0.00–0.07)
Basophils Absolute: 0 10*3/uL (ref 0.0–0.1)
Basophils Relative: 1 %
Eosinophils Absolute: 0.2 10*3/uL (ref 0.0–0.5)
Eosinophils Relative: 3 %
HCT: 36.2 % — ABNORMAL LOW (ref 39.0–52.0)
Hemoglobin: 11.4 g/dL — ABNORMAL LOW (ref 13.0–17.0)
Immature Granulocytes: 0 %
Lymphocytes Relative: 37 %
Lymphs Abs: 1.9 10*3/uL (ref 0.7–4.0)
MCH: 24.8 pg — ABNORMAL LOW (ref 26.0–34.0)
MCHC: 31.5 g/dL (ref 30.0–36.0)
MCV: 78.7 fL — ABNORMAL LOW (ref 80.0–100.0)
Monocytes Absolute: 0.7 10*3/uL (ref 0.1–1.0)
Monocytes Relative: 14 %
Neutro Abs: 2.3 10*3/uL (ref 1.7–7.7)
Neutrophils Relative %: 45 %
Platelets: 266 10*3/uL (ref 150–400)
RBC: 4.6 MIL/uL (ref 4.22–5.81)
RDW: 14.7 % (ref 11.5–15.5)
WBC: 5 10*3/uL (ref 4.0–10.5)
nRBC: 0 % (ref 0.0–0.2)

## 2023-08-26 LAB — BASIC METABOLIC PANEL WITH GFR
Anion gap: 7 (ref 5–15)
BUN: 20 mg/dL (ref 8–23)
CO2: 29 mmol/L (ref 22–32)
Calcium: 9.1 mg/dL (ref 8.9–10.3)
Chloride: 100 mmol/L (ref 98–111)
Creatinine, Ser: 0.74 mg/dL (ref 0.61–1.24)
GFR, Estimated: >60 mL/min (ref >60)
Glucose, Bld: 102 mg/dL — ABNORMAL HIGH (ref 70–99)
Potassium: 3.5 mmol/L (ref 3.5–5.1)
Sodium: 136 mmol/L (ref 135–145)

## 2023-08-26 MED ORDER — ENSURE ENLIVE PO LIQD: 237.0000 mL | Freq: Two times a day (BID) | ORAL | Status: DC

## 2023-08-26 NOTE — Plan of Care (Signed)

## 2023-08-26 NOTE — Progress Notes (Signed)
 [facility] Progress Note Patient: Travis [name] WUJ:[id number] DOB: 1932-05-03 DOA: 08/23/2023  DOS: the patient was seen and examined on 08/26/2023  Brief Hospital Course: PMH of HTN, BPH, Parkinson's disease, HLD, bladder cancer presented to the hospital with complaints of confusion and poor p.o. intake. Currently being treated for possible UTI.  Assessment and Plan: Acute metabolic encephalopathy in the setting of UTI. Treated with Antibiotic potential mentation improving.   Right pneumothorax secondary to fall Trauma surgery was consulted. Conservative management recommended. Continue oxygen.   Elevated troponin High-sensitivity troponins were mildly elevated at 47->43.  Patient had not reportedly had any complaints of chest pain.  EKG without significant ischemic changes. - Continue to monitor   Parkinson's disease with dementia Patient's daughter notes that she had held his Parkinson medication here recently and she question if it was making his symptoms worse.  At baseline patient is alert and oriented to self and normally is able to recognize family, but not always oriented to time and place. -Resume Sinemet.  Close to baseline.   Iron deficiency anemia Daughter makes note patient also has history of iron deficiency anemia.  Patient hemoglobin appears to be within normal limits, but suspect this is secondary to hemoconcentration as the patient appears to be likely dehydrated. Monitor CBC.   Abnormal CT of chest In addition to the pneumothorax patient was noted to have presence of a small soft tissue nodule within the upper trachea measuring 1.5 cms uncertain if this is ingested material or aspirated debris or a tracheal polyp.  - Daughter updated in regards to this finding and that nonemergent pulmonology follow-up could be considered.   Osteoarthritis of the right knee Daughter notes that the patient has been complaining more of right knee pain.  Known history of  osteoarthritis of the right knee but not a surgical candidate. - Voltaren gel to the right knee - [facility]   History of bladder cancer s/p TURBT BPH - Continue current medication regimen      Subjective: No nausea no vomiting no fever no chills.  No acute events overnight.   Physical Exam: General: in Mild distress, No Rash Cardiovascular: S1 and S2 Present, No Murmur Respiratory: Good respiratory effort, Bilateral Air entry present. No Crackles, No wheezes Abdomen: Bowel Sound present, No tenderness Extremities: No edema Neuro: Alert and oriented self, no new focal deficit  Data Reviewed: I have Reviewed nursing notes, Vitals, and Lab results. Since last encounter, pertinent lab results CBC and BMP   . I have ordered test including CBC and BMP  .   Disposition: Status is: Inpatient Remains inpatient appropriate because: Monitor for improvement in mentation.  enoxaparin (LOVENOX) injection 40 mg Start: 08/23/23 2000   Family Communication: No one at bedside Level of care: Telemetry Medical   Vitals:   08/26/23 0456 08/26/23 0900 08/26/23 0936 08/26/23 1646  BP: 112/74 111/79 111/79 106/72  Pulse: 65 67 67 63  Resp: 16 17 17 17   Temp: 98 F (36.7 C) 97.7 F (36.5 C) 98.1 F (36.7 C)   TempSrc:  Oral    SpO2: 96% 100% 97% 98%  Weight:      Height:         Author: Lynden Oxford, MD 08/26/2023 8:08 PM  Please look on [email or web address] to find out who is on call.

## 2023-08-26 NOTE — TOC Progression Note (Signed)
 Transition of Care [facility]) - Progression Note    Patient Details  Name: DEVELL PARKERSON MRN: [id number] Date of Birth: Sep 14, 1931  [facility] CM/SW Contact  Katrinka Parr, [place] Phone Number: 08/26/2023, 1:24 PM  Clinical Narrative:     CSW called pt's daughter and provided SNF bed offers and medicare star ratings. She chose [facility].  CSW confirmed with Meriel Stank that they can offer bed.   CSW initiated SNF auth request in online portal.  Ref# [id number]  Siegfried Dress is pending.            Social Determinants of Health (SDOH) Interventions SDOH Screenings   Food Insecurity: No Food Insecurity (08/23/2023)  Housing: Low Risk  (08/23/2023)  Transportation Needs: No Transportation Needs (08/23/2023)  Utilities: Not At Risk (08/23/2023)  Financial Resource Strain: Low Risk  (03/13/2023)   Received from [facility]  Social Connections: Socially Isolated (08/23/2023)  Tobacco Use: Low Risk  (08/23/2023)    Readmission Risk Interventions     No data to display

## 2023-08-26 NOTE — Hospital Course (Signed)
 PMH of HTN, BPH, Parkinson's disease, HLD, bladder cancer presented to the hospital with complaints of confusion and poor p.o. intake. Currently being treated for possible UTI.  Assessment and Plan: Acute metabolic encephalopathy in the setting of UTI. Treated with Antibiotic potential mentation improving.   Right pneumothorax secondary to fall Trauma surgery was consulted. Conservative management recommended. Continue oxygen.   Elevated troponin High-sensitivity troponins were mildly elevated at 47->43.  Patient had not reportedly had any complaints of chest pain.  EKG without significant ischemic changes. - Continue to monitor   Parkinson's disease with dementia Patient's daughter notes that she had held his Parkinson medication here recently and she question if it was making his symptoms worse.  At baseline patient is alert and oriented to self and normally is able to recognize family, but not always oriented to time and place. -Resume Sinemet.  Close to baseline.   Iron deficiency anemia Daughter makes note patient also has history of iron deficiency anemia.  Patient hemoglobin appears to be within normal limits, but suspect this is secondary to hemoconcentration as the patient appears to be likely dehydrated. Monitor CBC.   Abnormal CT of chest In addition to the pneumothorax patient was noted to have presence of a small soft tissue nodule within the upper trachea measuring 1.5 cms uncertain if this is ingested material or aspirated debris or a tracheal polyp.  - Daughter updated in regards to this finding and that nonemergent pulmonology follow-up could be considered.   Osteoarthritis of the right knee Daughter notes that the patient has been complaining more of right knee pain.  Known history of osteoarthritis of the right knee but not a surgical candidate. - Voltaren gel to the right knee - [facility]   History of bladder  cancer s/p TURBT BPH - Continue current medication regimen  {Problem list (Optional):1}

## 2023-08-26 NOTE — Progress Notes (Signed)
 Physical Therapy Treatment Patient Details Name: Travis [name] MRN: [id number] DOB: 12/08/1931 Today's Date: 08/26/2023   History of Present Illness 89 yo admitted with CT small volume right pneumothorax s/p fall and acute metabolic encephalopathy PMH HLD Parkinsons bladder CAs/p TURBT, arthritis BPH, dementia Eye implants, R hernia repair, L TKA    PT Comments  Pt with improved alertness and interaction with therapist today. Continues to follow one step commands inconsistently. Pt requiring two person maximal assist for bed mobility. Utilized Stedy to transfer from bed to chair. Pt continues with generalized weakness, impaired balance, and cognition. At baseline, he is ambulatory with a RW or Rollator. Patient will benefit from continued inpatient follow up therapy, <3 hours/day.    If plan is discharge home, recommend the following: Two people to help with walking and/or transfers;Two people to help with bathing/dressing/bathroom   Can travel by private vehicle     No  Equipment Recommendations  Hoyer lift;BSC/3in1    Recommendations for Other Services       Precautions / Restrictions Precautions Precautions: Fall Restrictions Weight Bearing Restrictions Per Provider Order: No     Mobility  Bed Mobility Overal bed mobility: Needs Assistance Bed Mobility: Rolling, Sidelying to Sit Rolling: Max assist Sidelying to sit: Max assist, +2 for physical assistance       General bed mobility comments: Limited initiation, requiring assist for LE's and trunk to upright    Transfers Overall transfer level: Needs assistance Equipment used: 2 person hand held assist, Rolling walker (2 wheels) Transfers: Sit to/from Stand Sit to Stand: Via lift equipment, Max assist, +2 physical assistance           General transfer comment: MaxA + 2 with use of bed pad to facilitate hip extension, pulling up to stand to [place]. Not able to achieve fully upright positioning     Ambulation/Gait                   Stairs             Wheelchair Mobility     Tilt Bed    Modified Rankin (Stroke Patients Only)       Balance Overall balance assessment: Needs assistance Sitting-balance support: Single extremity supported, Feet supported Sitting balance-Leahy Scale: Poor Sitting balance - Comments: Requiring minA for sitting balance                                    Communication Communication Communication: Impaired Factors Affecting Communication: Reduced clarity of speech  Cognition Arousal: Alert Behavior During Therapy: Flat affect   PT - Cognitive impairments: History of cognitive impairments                       PT - Cognition Comments: Follows 1 step commands inconsistently Following commands: Intact      Cueing Cueing Techniques: Verbal cues, Gestural cues, Tactile cues, Visual cues  Exercises      General Comments        Pertinent Vitals/Pain Pain Assessment Pain Assessment: Faces Faces Pain Scale: Hurts even more Pain Location: R knee Pain Descriptors / Indicators: Grimacing, Discomfort Pain Intervention(s): Limited activity within patient's tolerance, Monitored during session, Repositioned    Home Living                          Prior Function  PT Goals (current goals can now be found in the care plan section) Acute Rehab PT Goals Patient Stated Goal: pt daughter would like him to return to walking PT Goal Formulation: With patient/family Time For Goal Achievement: 09/07/23 Potential to Achieve Goals: Fair Progress towards PT goals: Progressing toward goals    Frequency    Min 2X/week      PT Plan      Co-evaluation              AM-PAC PT "6 Clicks" Mobility   Outcome Measure  Help needed turning from your back to your side while in a flat bed without using bedrails?: A Lot Help needed moving from lying on your back to sitting on the  side of a flat bed without using bedrails?: Total Help needed moving to and from a bed to a chair (including a wheelchair)?: Total Help needed standing up from a chair using your arms (e.g., wheelchair or bedside chair)?: Total Help needed to walk in hospital room?: Total Help needed climbing 3-5 steps with a railing? : Total 6 Click Score: 7    End of Session Equipment Utilized During Treatment: Gait belt Activity Tolerance: Patient tolerated treatment well Patient left: in chair;with call bell/phone within reach;with chair alarm set Nurse Communication: Mobility status PT Visit Diagnosis: Unsteadiness on feet (R26.81);History of falling (Z91.81);Difficulty in walking, not elsewhere classified (R26.2)     Time: 9147-8295 PT Time Calculation (min) (ACUTE ONLY): 21 min  Charges:    $Therapeutic Activity: 8-22 mins PT General Charges $$ ACUTE PT VISIT: 1 Visit                     Verdia Glad, PT, DPT Acute Rehabilitation Services Office [phone]    Claria Crofts 08/26/2023, 11:09 AM

## 2023-08-26 NOTE — Plan of Care (Signed)
  Problem: Health Behavior/Discharge Planning: Goal: Ability to manage health-related needs will improve Outcome: Progressing   Problem: Nutrition: Goal: Adequate nutrition will be maintained Outcome: Progressing   Problem: Elimination: Goal: Will not experience complications related to bowel motility Outcome: Progressing   Problem: Elimination: Goal: Will not experience complications related to urinary retention Outcome: Progressing   Problem: Safety: Goal: Ability to remain free from injury will improve Outcome: Progressing   Problem: Skin Integrity: Goal: Risk for impaired skin integrity will decrease Outcome: Progressing

## 2023-08-27 DIAGNOSIS — G9341 Metabolic encephalopathy: Secondary | ICD-10-CM | POA: Diagnosis not present

## 2023-08-27 LAB — BASIC METABOLIC PANEL WITH GFR
Anion gap: 11 (ref 5–15)
BUN: 16 mg/dL (ref 8–23)
CO2: 27 mmol/L (ref 22–32)
Calcium: 9.2 mg/dL (ref 8.9–10.3)
Chloride: 99 mmol/L (ref 98–111)
Creatinine, Ser: 0.68 mg/dL (ref 0.61–1.24)
GFR, Estimated: >60 mL/min (ref >60)
Glucose, Bld: 99 mg/dL (ref 70–99)
Potassium: 3.6 mmol/L (ref 3.5–5.1)
Sodium: 137 mmol/L (ref 135–145)

## 2023-08-27 LAB — CBC WITH DIFFERENTIAL/PLATELET
Abs Immature Granulocytes: 0.01 10*3/uL (ref 0.00–0.07)
Basophils Absolute: 0 10*3/uL (ref 0.0–0.1)
Basophils Relative: 0 %
Eosinophils Absolute: 0.2 10*3/uL (ref 0.0–0.5)
Eosinophils Relative: 4 %
HCT: 34.4 % — ABNORMAL LOW (ref 39.0–52.0)
Hemoglobin: 11 g/dL — ABNORMAL LOW (ref 13.0–17.0)
Immature Granulocytes: 0 %
Lymphocytes Relative: 45 %
Lymphs Abs: 2.1 10*3/uL (ref 0.7–4.0)
MCH: 24.9 pg — ABNORMAL LOW (ref 26.0–34.0)
MCHC: 32 g/dL (ref 30.0–36.0)
MCV: 77.8 fL — ABNORMAL LOW (ref 80.0–100.0)
Monocytes Absolute: 0.5 10*3/uL (ref 0.1–1.0)
Monocytes Relative: 11 %
Neutro Abs: 1.9 10*3/uL (ref 1.7–7.7)
Neutrophils Relative %: 40 %
Platelets: 254 10*3/uL (ref 150–400)
RBC: 4.42 MIL/uL (ref 4.22–5.81)
RDW: 14.6 % (ref 11.5–15.5)
WBC: 4.7 10*3/uL (ref 4.0–10.5)
nRBC: 0 % (ref 0.0–0.2)

## 2023-08-27 MED ORDER — DICLOFENAC SODIUM 1 % EX GEL: 2.0000 g | Freq: Four times a day (QID) | CUTANEOUS | 0 refills | Status: AC

## 2023-08-27 MED ORDER — ENSURE ENLIVE PO LIQD: 237.0000 mL | Freq: Two times a day (BID) | ORAL | Status: AC

## 2023-08-27 MED ORDER — CARBIDOPA-LEVODOPA 25-100 MG PO TABS: 0.5000 | ORAL_TABLET | Freq: Two times a day (BID) | ORAL | Status: DC

## 2023-08-27 NOTE — Progress Notes (Signed)
 Gifford Kuster to be D/C'd  per MD order.  Report given to [facility] LPN, all question answer.  VSS, Skin clean, dry and intact without evidence of skin break down, no evidence of skin tears noted.  IV catheter discontinued intact. Site without signs and symptoms of complications. Dressing and pressure applied.  An After Visit Summary was printed and given to the PTAR.

## 2023-08-27 NOTE — TOC Transition Note (Signed)
 Transition of Care [facility]) - Discharge Note   Patient Details  Name: Travis [name] MRN: [id number] Date of Birth: Aug 24, 1931  [facility]) CM/SW Contact:  Jeffory Mings, [place] Phone Number: 08/27/2023, 12:04 PM   Clinical Narrative: Pt for dc to [facility]. Auth details received ([id number], valid 4/17-4/21) and provided to [place] at [facility] who confirmed they are prepared to admit pt to room 501. Pt's dtr [name] aware of dc and reports agreeable. RN provided with number for report and PTAR arranged for transport. SW signing off at dc.   Paullette Boston, MSW, LCSW [phone] (coverage)        Final next level of care: Skilled Nursing Facility Barriers to Discharge: Barriers Resolved   Patient Goals and CMS Choice   CMS Medicare.gov Compare Post Acute Care list provided to:: Other (Comment Required) (adult children) Choice offered to / list presented to : Adult Children [place] ownership interest in [facility].provided to:: Adult Children    Discharge Placement              Patient chooses bed at: [facility] Patient to be transferred to facility by: PTAR Name of family member notified: [name]/dtr Patient and family notified of of transfer: 08/27/23  Discharge Plan and Services Additional resources added to the After Visit Summary for                                       Social Drivers of Health (SDOH) Interventions SDOH Screenings   Food Insecurity: No Food Insecurity (08/23/2023)  Housing: Low Risk  (08/23/2023)  Transportation Needs: No Transportation Needs (08/23/2023)  Utilities: Not At Risk (08/23/2023)  Financial Resource Strain: Low Risk  (03/13/2023)   Received from [facility]  Social Connections: Socially Isolated (08/23/2023)  Tobacco Use: Low Risk  (08/23/2023)     Readmission Risk Interventions     No data to display

## 2023-08-27 NOTE — Discharge Summary (Signed)
 Physician Discharge Summary   Patient: Travis [name] MRN: [id number] DOB: Jul 03, 1931  Admit date:     08/23/2023  Discharge date: 08/27/23  Discharge Physician: Charlean Congress  PCP: Jimmy Moulding, MD  Recommendations at discharge: Follow up with PCP in 1 week, repeat chest x ray  Follow up with urology Follow up with Neurology    Follow-up Information     Jimmy Moulding, MD. Schedule an appointment as soon as possible for a visit in 1 week(s).   Specialty: Internal Medicine Why: with Repeat Chest X ray in 4 weeks Contact information: [address] [facility] [address] [place] [address] [phone]         Christina Coyer, MD. Schedule an appointment as soon as possible for a visit in 2 week(s).   Specialty: Urology Contact information: [address] [place] [address] [phone]         Neurology. Schedule an appointment as soon as possible for a visit in 2 week(s).   Why: to adjust meds for parkinsons.               Discharge Diagnoses: Principal Problem:   Acute metabolic encephalopathy Active Problems:   Pneumothorax   Elevated troponin   Urinary tract infection   Gait disturbance   Fall at home, initial encounter   Parkinson's disease dementia (HCC)   Iron deficiency anemia   Abnormal CT of the chest   Osteoarthritis of knee   BPH with obstruction/lower urinary tract symptoms    Hospital Course: PMH of HTN, BPH, Parkinson's disease, HLD, bladder cancer presented to the hospital with complaints of confusion and poor p.o. intake. Currently being treated for possible UTI.  Assessment and Plan: Acute metabolic encephalopathy in the setting of suspected UTI. Treated with Antibiotic  mentation improving   Right pneumothorax secondary to fall Trauma surgery was consulted. Conservative management recommended. Continue incentive spirometry.   Elevated troponin High-sensitivity troponins were  mildly elevated at 47->43.  Patient had not reportedly had any complaints of chest pain.  EKG without significant ischemic changes.   Parkinson's disease with dementia Patient's daughter notes that she had held his Parkinson medication here recently and she question if it was making his symptoms worse.   At baseline patient is alert and oriented to self and normally is able to recognize family, but not always oriented to time and place. Neurology was consulted, recommended outpt follow up with Neurology to adjust meds  Resume Sinemet   Iron deficiency anemia Daughter makes note patient also has history of iron deficiency anemia.  Patient hemoglobin appears to be within normal limits,  Abnormal CT of chest In addition to the pneumothorax patient was noted to have presence of a small soft tissue nodule within the upper trachea measuring 1.5 cms uncertain if this is ingested material or aspirated debris or a tracheal polyp.  Daughter updated in regards to this finding and that nonemergent pulmonology follow-up could be considered.   Osteoarthritis of the right knee Daughter notes that the patient has been complaining more of right knee pain.  Known history of osteoarthritis of the right knee but not a surgical candidate.  Voltaren gel to the right knee  [facility]   History of bladder cancer s/p TURBT BPH Continue current medication regimen Outpt follow up with urology recommended      Consultants:  none  Procedures performed:  none  DISCHARGE MEDICATION: Allergies as of  08/27/2023   No Known Allergies      Medication List     STOP taking these medications    furosemide 20 MG tablet Commonly known as: LASIX       TAKE these medications    acetaminophen 650 MG CR tablet Commonly known as: TYLENOL Take 1,300 mg by mouth daily as needed for pain.   ascorbic acid 500 MG tablet Commonly known as: VITAMIN C Take 1  tablet (500 mg total) by mouth daily.   carbidopa-levodopa 25-100 MG tablet Commonly known as: SINEMET IR Take 0.5 tablets by mouth 2 (two) times daily at 10 AM and 5 PM. What changed: when to take this   cetirizine 10 MG tablet Commonly known as: ZYRTEC Take 10 mg by mouth daily.   cyanocobalamin 1000 MCG tablet Commonly known as: VITAMIN B12 Take 1,000 mcg by mouth daily.   diclofenac Sodium 1 % Gel Commonly known as: VOLTAREN Apply 2 g topically 4 (four) times daily.   feeding supplement Liqd Take 237 mLs by mouth 2 (two) times daily between meals.   finasteride 5 MG tablet Commonly known as: PROSCAR Take 1 tablet (5 mg total) by mouth daily.   Gemtesa 75 MG Tabs Generic drug: Vibegron Take 1 tablet (75 mg total) by mouth daily.   pantoprazole 40 MG tablet Commonly known as: PROTONIX Take 40 mg by mouth daily.   tamsulosin 0.4 MG Caps capsule Commonly known as: FLOMAX Take 1 capsule (0.4 mg total) by mouth daily after supper.   VITAMIN D PO Take 1 tablet by mouth daily.   zinc sulfate (50mg  elemental zinc) 220 (50 Zn) MG capsule Take 1 capsule (220 mg total) by mouth daily.       Disposition: SNF Diet recommendation: Regular diet  Discharge Exam: Vitals:   08/26/23 1646 08/26/23 2019 08/27/23 0445 08/27/23 0828  BP: 106/72 100/69 113/77 113/65  Pulse: 63 68 (!) 59 (!) 59  Resp: 17 16 18 17   Temp:  98.9 F (37.2 C) 98.3 F (36.8 C) (!) 97.5 F (36.4 C)  TempSrc:  Oral  Oral  SpO2: 98% 97% 97% 95%  Weight:      Height:       General: Appear in no distress; no visible Abnormal Neck Mass Or lumps, Conjunctiva normal Cardiovascular: S1 and S2 Present, no Murmur, Respiratory: good respiratory effort, Bilateral Air entry present and CTA, no Crackles, no wheezes Abdomen: Bowel Sound present, Non tender  Extremities: no Pedal edema Neurology: alert and oriented to place and person tremors seen on intention.   Filed Weights   08/23/23 0843  Weight:  82.6 kg   Condition at discharge: stable  The results of significant diagnostics from this hospitalization (including imaging, microbiology, ancillary and laboratory) are listed below for reference.   Imaging Studies: DG Chest 2 View Result Date: 08/25/2023 CLINICAL DATA:  Difficulty breathing EXAM: CHEST - 2 VIEW COMPARISON:  Film from the previous day. FINDINGS: Cardiac shadow is stable. Aortic calcifications are noted. Previously seen right-sided pneumothorax is again noted and stable. No significant interval changes noted. Mild basilar atelectasis is seen compare with the prior study. No other focal abnormality is noted. IMPRESSION: Stable right pneumothorax. Improved aeration in the bases bilaterally. Electronically Signed   By: Alcide Clever M.D.   On: 08/25/2023 19:59   DG CHEST PORT 1 VIEW Result Date: 08/24/2023 CLINICAL DATA:  Follow up pneumothorax. EXAM: PORTABLE CHEST 1 VIEW COMPARISON:  Radiographs 08/23/2023 and 10/20/2022.  CT 08/23/2023. FINDINGS: [phone]  hours. Persistent low lung volumes with mildly increased bibasilar atelectasis. Small right apical pneumothorax is unchanged. No tension component or significant pleural effusion identified. The heart size and mediastinal contours are stable. No acute fractures are identified. IMPRESSION: 1. Unchanged small right apical pneumothorax. 2. Mildly increased bibasilar atelectasis. Electronically Signed   By: Elmon Hagedorn M.D.   On: 08/24/2023 10:43   DG Knee 1-2 Views Right Result Date: 08/24/2023 CLINICAL DATA:  Right knee pain.  History of arthritis. EXAM: RIGHT KNEE - 1-2 VIEW COMPARISON:  None Available. FINDINGS: The bones appear adequately mineralized. No evidence of acute fracture or dislocation. There are mild-to-moderate tricompartmental degenerative changes for age with a small knee joint effusion. Scattered vascular calcifications are noted. IMPRESSION: Mild-to-moderate tricompartmental degenerative changes with small knee joint  effusion. No acute osseous findings. Electronically Signed   By: Elmon Hagedorn M.D.   On: 08/24/2023 10:40   CT Head Wo Contrast Result Date: 08/23/2023 CLINICAL DATA:  Neck trauma (Age >= 65y); Head trauma, minor (Age >= 65y). Altered mental status. EXAM: CT HEAD WITHOUT CONTRAST CT CERVICAL SPINE WITHOUT CONTRAST TECHNIQUE: Multidetector CT imaging of the head and cervical spine was performed following the standard protocol without intravenous contrast. Multiplanar CT image reconstructions of the cervical spine were also generated. RADIATION DOSE REDUCTION: This exam was performed according to the departmental dose-optimization program which includes automated exposure control, adjustment of the mA and/or kV according to patient size and/or use of iterative reconstruction technique. COMPARISON:  CT head 12/19/2022. MRI head 12/20/2022. MRI cervical spine 12/13/2020. FINDINGS: CT HEAD FINDINGS Brain: There is no evidence of an acute infarct, intracranial hemorrhage, mass, midline shift, or extra-axial fluid collection. There is mild cerebral atrophy. Cerebral white matter hypodensities are unchanged and nonspecific but compatible with mild chronic small vessel ischemic disease. Vascular: Extensive calcified atherosclerosis at the skull base. No acutely suspicious hyperdense vessel. Skull: No acute fracture or suspicious lesion. Sinuses/Orbits: The visualized mastoid air cells and paranasal sinuses are clear. Bilateral cataract extraction. Other: None. CT CERVICAL SPINE FINDINGS Alignment: Trace retrolisthesis of C3 on C4, unchanged. Skull base and vertebrae: No acute fracture. 1 cm lucent lesion in the posterior aspect of the C7 vertebral body on the left without a corresponding abnormality on the prior MRI. Scattered small Schmorl's nodes. Soft tissues and spinal canal: No prevertebral fluid or swelling. No visible canal hematoma. Disc levels: Moderate cervical disc degeneration with multilevel spinal  stenosis, moderate at C4-5. Mild-to-moderate multilevel neural foraminal stenosis. Upper chest: More fully evaluated on today's separately reported chest CT. Other: None. IMPRESSION: 1. No evidence of acute intracranial abnormality. 2. Mild chronic small vessel ischemic disease. 3. No acute cervical spine fracture or traumatic malalignment. 4. 1 cm lucent lesion in the C7 vertebral body, indeterminate but potentially new from 2022. Consider nonemergent cervical spine MRI without and with contrast for further evaluation. Electronically Signed   By: Aundra Lee M.D.   On: 08/23/2023 14:21   CT Cervical Spine Wo Contrast Result Date: 08/23/2023 CLINICAL DATA:  Neck trauma (Age >= 65y); Head trauma, minor (Age >= 65y). Altered mental status. EXAM: CT HEAD WITHOUT CONTRAST CT CERVICAL SPINE WITHOUT CONTRAST TECHNIQUE: Multidetector CT imaging of the head and cervical spine was performed following the standard protocol without intravenous contrast. Multiplanar CT image reconstructions of the cervical spine were also generated. RADIATION DOSE REDUCTION: This exam was performed according to the departmental dose-optimization program which includes automated exposure control, adjustment of the mA and/or kV according to patient size  and/or use of iterative reconstruction technique. COMPARISON:  CT head 12/19/2022. MRI head 12/20/2022. MRI cervical spine 12/13/2020. FINDINGS: CT HEAD FINDINGS Brain: There is no evidence of an acute infarct, intracranial hemorrhage, mass, midline shift, or extra-axial fluid collection. There is mild cerebral atrophy. Cerebral white matter hypodensities are unchanged and nonspecific but compatible with mild chronic small vessel ischemic disease. Vascular: Extensive calcified atherosclerosis at the skull base. No acutely suspicious hyperdense vessel. Skull: No acute fracture or suspicious lesion. Sinuses/Orbits: The visualized mastoid air cells and paranasal sinuses are clear. Bilateral  cataract extraction. Other: None. CT CERVICAL SPINE FINDINGS Alignment: Trace retrolisthesis of C3 on C4, unchanged. Skull base and vertebrae: No acute fracture. 1 cm lucent lesion in the posterior aspect of the C7 vertebral body on the left without a corresponding abnormality on the prior MRI. Scattered small Schmorl's nodes. Soft tissues and spinal canal: No prevertebral fluid or swelling. No visible canal hematoma. Disc levels: Moderate cervical disc degeneration with multilevel spinal stenosis, moderate at C4-5. Mild-to-moderate multilevel neural foraminal stenosis. Upper chest: More fully evaluated on today's separately reported chest CT. Other: None. IMPRESSION: 1. No evidence of acute intracranial abnormality. 2. Mild chronic small vessel ischemic disease. 3. No acute cervical spine fracture or traumatic malalignment. 4. 1 cm lucent lesion in the C7 vertebral body, indeterminate but potentially new from 2022. Consider nonemergent cervical spine MRI without and with contrast for further evaluation. Electronically Signed   By: Aundra Lee M.D.   On: 08/23/2023 14:21   DG Chest 2 View Addendum Date: 08/23/2023 ADDENDUM REPORT: 08/23/2023 12:36 ADDENDUM: Small right apical pneumothorax. Finding better evaluated on CT chest 08/23/2023 Electronically Signed   By: Morgane  Naveau M.D.   On: 08/23/2023 12:36   Result Date: 08/23/2023 CLINICAL DATA:  Tachypnea EXAM: CHEST - 2 VIEW COMPARISON:  Chest x-ray 10/20/2022 FINDINGS: The heart and mediastinal contours are unchanged. Atherosclerotic plaque. Low lung volumes. Right base vague airspace opacity. No focal consolidation. No pulmonary edema. No pleural effusion. No pneumothorax. No acute osseous abnormality. IMPRESSION: 1. Low lung volumes with right base vague airspace opacity that could represent developing infection/inflammation versus atelectasis. Followup PA and lateral chest X-ray is recommended in 3-4 weeks following therapy to ensure resolution and  exclude underlying malignancy. 2.  Aortic Atherosclerosis (ICD10-I70.0). Electronically Signed: By: Morgane  Naveau M.D. On: 08/23/2023 11:01   CT CHEST WO CONTRAST Result Date: 08/23/2023 CLINICAL DATA:  Chest wall pain, nontraumatic, infection or inflammation suspected, xray done EXAM: CT CHEST WITHOUT CONTRAST TECHNIQUE: Multidetector CT imaging of the chest was performed following the standard protocol without IV contrast. RADIATION DOSE REDUCTION: This exam was performed according to the departmental dose-optimization program which includes automated exposure control, adjustment of the mA and/or kV according to patient size and/or use of iterative reconstruction technique. COMPARISON:  August 23, 2023, October 20, 2022 FINDINGS: Cardiovascular: No cardiomegaly or pericardial effusion. Fusiform dilation of the ascending aorta measuring 4.1 cm. Scattered atherosclerosis. Dense multi-vessel coronary atherosclerosis. Mediastinum/Nodes: No mediastinal mass. No mediastinal, hilar, or axillary lymphadenopathy. Lungs/Pleura: Slight rightward shifting of the trachea, which is otherwise patent. There is a small soft tissue nodule dependently within the upper trachea, measuring 1.5 cm. Multifocal subsegmental atelectasis in both lower lobes. No lobar consolidation. There is a trace left pleural effusion. Small volume right pneumothorax, measuring 2.4 cm at the lung base and 0.8 cm at the lung apex. This is estimated at 15% by volume. Musculoskeletal: No acute, displaced fracture or destructive bone lesion. Diffuse osteopenia. Multilevel degenerative  disc disease of the spine. Upper Abdomen: No acute abnormality in the partially visualized upper abdomen. IMPRESSION: 1. Small volume right pneumothorax estimated at 15% by volume. No findings to suggest tension pathology. 2. Small soft tissue nodule dependently within the upper trachea, measuring 1.5 cm. Is uncertain if this is ingested material or aspirated debris or a  tracheal polyp. Nonemergent pulmonology follow-up could be considered. 3. Trace left pleural effusion. Bibasilar subsegmental atelectasis. No lobar consolidation to suggest pneumonia. 4. Fusiform dilation of the ascending aorta measuring 4.1 cm. Recommend annual imaging followup by CTA or MRA. This recommendation follows 2010 ACCF/AHA/AATS/ACR/ASA/SCA/SCAI/SIR/STS/SVM Guidelines for the Diagnosis and Management of Patients with Thoracic Aortic Disease. Circulation. 2010; 121: U045-W098. Aortic aneurysm NOS (ICD10-I71.9) Aortic Atherosclerosis (ICD10-I70.0). Critical Value/emergent results were called by telephone at the time of interpretation on 08/23/2023 at 12:08 pm to provider Dr. Rush Landmark, who verbally acknowledged these results. Electronically Signed   By: Wallie Char M.D.   On: 08/23/2023 12:30    Microbiology: Results for orders placed or performed during the hospital encounter of 08/23/23  Urine Culture     Status: Abnormal   Collection Time: 08/23/23  9:26 AM   Specimen: Urine, Random  Result Value Ref Range Status   Specimen Description URINE, RANDOM  Final   Special Requests   Final    NONE Reflexed from [id number] Performed at [facility], [address] [place], [place] [address]    Culture MULTIPLE SPECIES PRESENT, SUGGEST RECOLLECTION (A)  Final   Report Status 08/24/2023 FINAL  Final  Resp panel by RT-PCR (RSV, Flu A&B, Covid) Anterior Nasal Swab     Status: None   Collection Time: 08/23/23  4:46 PM   Specimen: Anterior Nasal Swab  Result Value Ref Range Status   SARS Coronavirus 2 by RT PCR NEGATIVE NEGATIVE Final   Influenza A by PCR NEGATIVE NEGATIVE Final   Influenza B by PCR NEGATIVE NEGATIVE Final    Comment: (NOTE) The Xpert Xpress SARS-CoV-2/FLU/RSV plus assay is intended as an aid in the diagnosis of influenza from Nasopharyngeal swab specimens and should not be used as a sole basis for treatment. Nasal washings and aspirates are unacceptable for Xpert Xpress  SARS-CoV-2/FLU/RSV testing.  Fact Sheet for Patients: BloggerCourse.com  Fact Sheet for Healthcare Providers: SeriousBroker.it  This test is not yet approved or cleared by the [place] FDA and has been authorized for detection and/or diagnosis of SARS-CoV-2 by FDA under an Emergency Use Authorization (EUA). This EUA will remain in effect (meaning this test can be used) for the duration of the COVID-19 declaration under Section 564(b)(1) of the Act, 21 U.S.C. section 360bbb-3(b)(1), unless the authorization is terminated or revoked.     Resp Syncytial Virus by PCR NEGATIVE NEGATIVE Final    Comment: (NOTE) Fact Sheet for Patients: BloggerCourse.com  Fact Sheet for Healthcare Providers: SeriousBroker.it  This test is not yet approved or cleared by the [place] FDA and has been authorized for detection and/or diagnosis of SARS-CoV-2 by FDA under an Emergency Use Authorization (EUA). This EUA will remain in effect (meaning this test can be used) for the duration of the COVID-19 declaration under Section 564(b)(1) of the Act, 21 U.S.C. section 360bbb-3(b)(1), unless the authorization is terminated or revoked.  Performed at [facility], [address] [place], [place] [address]    Labs: CBC: Recent Labs  Lab 08/23/23 1102 08/24/23 0210 08/25/23 0246 08/26/23 0635 08/27/23 0559  WBC 7.1 6.2 6.5 5.0 4.7  NEUTROABS 4.7  --   --  2.3 1.9  HGB 13.2 13.1 12.7* 11.4* 11.0*  HCT 41.4 41.6 39.6 36.2* 34.4*  MCV 79.6* 78.3* 78.0* 78.7* 77.8*  PLT 233 261 253 266 254   Basic Metabolic Panel: Recent Labs  Lab 08/23/23 1102 08/24/23 0210 08/24/23 1958 08/26/23 0635 08/27/23 0559  NA 139 141 138 136 137  K 3.7 3.6 3.5 3.5 3.6  CL 102 103 99 100 99  CO2 24 29 28 29 27   GLUCOSE 101* 113* 119* 102* 99  BUN 14 16 15 20 16   CREATININE 0.81 0.74 0.70 0.74 0.68   CALCIUM 9.8 9.8 9.4 9.1 9.2   Liver Function Tests: Recent Labs  Lab 08/23/23 1102 08/24/23 1958  AST 26 17  ALT 13 11  ALKPHOS 73 69  BILITOT 0.8 0.8  PROT 7.2 6.6  ALBUMIN 3.5 3.1*   CBG: No results for input(s): "GLUCAP" in the last 168 hours.  Discharge time spent: greater than 30 minutes.  Author: Charlean Congress, MD  [facility]

## 2023-10-12 ENCOUNTER — Encounter: Payer: Self-pay | Admitting: Urology

## 2023-10-12 ENCOUNTER — Ambulatory Visit: Payer: Medicare Other | Admitting: Urology

## 2023-10-12 VITALS — BP 141/76 | HR 74

## 2023-10-12 DIAGNOSIS — N401 Enlarged prostate with lower urinary tract symptoms: Secondary | ICD-10-CM | POA: Diagnosis not present

## 2023-10-12 DIAGNOSIS — N138 Other obstructive and reflux uropathy: Secondary | ICD-10-CM | POA: Diagnosis not present

## 2023-10-12 DIAGNOSIS — Z8551 Personal history of malignant neoplasm of bladder: Secondary | ICD-10-CM

## 2023-10-12 LAB — URINALYSIS, ROUTINE W REFLEX MICROSCOPIC
Bilirubin, UA: NEGATIVE
Glucose, UA: NEGATIVE
Ketones, UA: NEGATIVE
Leukocytes,UA: NEGATIVE
Nitrite, UA: NEGATIVE
Protein,UA: NEGATIVE
Specific Gravity, UA: 1.015 (ref 1.005–1.030)
Urobilinogen, Ur: 1 mg/dL (ref 0.2–1.0)
pH, UA: 5.5 (ref 5.0–7.5)

## 2023-10-12 LAB — MICROSCOPIC EXAMINATION: Bacteria, UA: NONE SEEN

## 2023-10-12 MED ORDER — CIPROFLOXACIN HCL 500 MG PO TABS
500.0000 mg | ORAL_TABLET | Freq: Once | ORAL | Status: AC
  Administered 2023-10-12: 500 mg via ORAL

## 2023-10-12 NOTE — Progress Notes (Signed)
 10/12/2023 2:47 PM   Travis [name] 1931-05-23 [id number]  Referring provider: Jimmy Moulding, MD [address] [facility] [place],  [place] [address]  No chief complaint on file.   HPI:  F/u -    1) h/o High risk bladder ca - TURBT 09/2016 with Dr. Domingo Friend for large >6cm HG Ta UCC right bladder tumor. Cystoscopy benign Jul 2024.    Imaging/staging: -Mar 2020 CT a/p - benign  -Apr 2023 CT a/p - benign, BPH  -June 2024 CT a/p - benign    Intravesical tx: -2018 BCG induction x 6 -2019 BCG maintenance - declined further BCG (in part due to COVID in 2020).     2) BPH - CT scan in 2013 with prostate enlargement noted (~100g). He had frequency and urgency and was started on oxybutynin  in 2019. Symptoms returned when he ran out of oxybutynin . He has urgency and occ UUI. He does have some confusion and constipation. No gross hematuria. Gemtesa  working well. His stream is good. Noc x 1. Daytime frequency and urgency.    He started finasteride  Mar 2023 for BPH and LUTS. Inc freq/urge in Oct 2023. Rx for Gemtesa  given. He is taking it. Noc x 1- 2 -3-4. He uses a urinal. They tried to go QOD but nocturia and frequency worse. Admit June 2024 with fever. CXR and CT benign. BPH present. Tx with abx. F/u PVR 0.   Today, seen for the above for 1) h/o bladder cancer and cystoscopy and 2) management of BPH, UTI. He was in hospital Apr 2025 with dementia and parkinsons. Suspected UTI. Urine cx mult species. No abd imaging. UA normal today. No dysuria. No gross hematuria. On tams and 5ari. Here for cystoscopy. 04/25 cxr - benign. Right PTX.. Cr 0.68.    PMH: Past Medical History:  Diagnosis Date   Arthritis    Cancer (HCC)    BLADDER   Complication of anesthesia    had a hard time recovering from anesthesia after knee replacement neuro symptoms   Dementia (HCC)    Hyperlipidemia    Parkinson disease (HCC)     Surgical History: Past Surgical History:  Procedure  Laterality Date   eye implants  1992   EYE SURGERY Bilateral    Cataract Extraction with IOL Implants   HERNIA REPAIR Right 1992   Inguinal Hernia Repair   INGUINAL HERNIA REPAIR Left 01/03/2016   Procedure: LAPAROSCOPIC INGUINAL HERNIA;  Surgeon: Alben Alma, MD;  Location: [facility];  Service: General;  Laterality: Left;   INGUINAL HERNIA REPAIR Left 06/01/2019   Procedure: HERNIA REPAIR INGUINAL ADULT;  Surgeon: Eldred Grego, MD;  Location: [facility];  Service: General;  Laterality: Left;   JOINT REPLACEMENT     TOTAL KNEE ARTHROPLASTY Left 03/18/2016   Procedure: TOTAL KNEE ARTHROPLASTY;  Surgeon: Molli Angelucci, MD;  Location: [facility];  Service: Orthopedics;  Laterality: Left;   TRANSURETHRAL RESECTION OF BLADDER TUMOR N/A 09/19/2016   Procedure: TRANSURETHRAL RESECTION OF BLADDER TUMOR (TURBT) MEDIUM;  Surgeon: Bart Born, MD;  Location: [facility];  Service: Urology;  Laterality: N/A;    Home Medications:  Allergies as of 10/12/2023   No Known Allergies      Medication List        Accurate as of October 12, 2023  2:47 PM. If you have any questions, ask your nurse or doctor.          acetaminophen  650 MG CR tablet Commonly known  as: TYLENOL  Take 1,300 mg by mouth daily as needed for pain.   ascorbic acid  500 MG tablet Commonly known as: VITAMIN C  Take 1 tablet (500 mg total) by mouth daily.   carbidopa -levodopa  25-100 MG tablet Commonly known as: SINEMET  IR Take 0.5 tablets by mouth 2 (two) times daily at 10 AM and 5 PM.   cetirizine 10 MG tablet Commonly known as: ZYRTEC Take 10 mg by mouth daily.   cyanocobalamin  1000 MCG tablet Commonly known as: VITAMIN B12 Take 1,000 mcg by mouth daily.   diclofenac  Sodium 1 % Gel Commonly known as: VOLTAREN  Apply 2 g topically 4 (four) times daily.   feeding supplement Liqd Take 237 mLs by mouth 2 (two) times daily between meals.   finasteride  5 MG tablet Commonly known as: PROSCAR  Take 1 tablet (5 mg  total) by mouth daily.   Gemtesa  75 MG Tabs Generic drug: Vibegron  Take 1 tablet (75 mg total) by mouth daily.   pantoprazole  40 MG tablet Commonly known as: PROTONIX  Take 40 mg by mouth daily.   tamsulosin  0.4 MG Caps capsule Commonly known as: FLOMAX  Take 1 capsule (0.4 mg total) by mouth daily after supper.   VITAMIN D PO Take 1 tablet by mouth daily.   zinc  sulfate (50mg  elemental zinc ) 220 (50 Zn) MG capsule Take 1 capsule (220 mg total) by mouth daily.        Allergies: No Known Allergies  Family History: Family History  Problem Relation Age of Onset   Stroke Father    Diabetes Sister    Breast cancer Sister    Cancer Maternal Aunt    Hematuria Neg Hx    Renal cancer Neg Hx    Prostate cancer Neg Hx    Bladder Cancer Neg Hx     Social History:  reports that he has never smoked. He has never used smokeless tobacco. He reports that he does not drink alcohol and does not use drugs.  PROCEDURE: Blood pressure (!) 141/76, pulse 74. NED. A&Ox3.   No respiratory distress   Abd soft, NT, ND Normal phallus with bilateral descended testicles, uncircumcised, no phimosis or foreskin lesions  Cystoscopy Procedure Note  Patient identification was confirmed, informed consent was obtained, and patient was prepped using Betadine solution.  Lidocaine  jelly was administered per urethral meatus.     Pre-Procedure: - Inspection reveals a normal caliber ureteral meatus.  Procedure: The flexible cystoscope was introduced without difficulty - No urethral strictures/lesions are present. - moderate BPH, borderline obs prostate  - normal bladder neck - Bilateral ureteral orifices identified - Bladder mucosa  reveals no ulcers, tumors, or lesions - No bladder stones - No trabeculation  Retroflexion shows normal bladder   Post-Procedure: - Patient tolerated the procedure well      Laboratory Data: Lab Results  Component Value Date   WBC 4.7 08/27/2023   HGB  11.0 (L) 08/27/2023   HCT 34.4 (L) 08/27/2023   MCV 77.8 (L) 08/27/2023   PLT 254 08/27/2023    Lab Results  Component Value Date   CREATININE 0.68 08/27/2023    No results found for: "PSA"  No results found for: "TESTOSTERONE"  No results found for: "HGBA1C"  Urinalysis    Component Value Date/Time   COLORURINE AMBER (A) 08/23/2023 0926   APPEARANCEUR TURBID (A) 08/23/2023 0926   APPEARANCEUR Clear 04/13/2023 1455   LABSPEC 1.026 08/23/2023 0926   PHURINE 5.0 08/23/2023 0926   GLUCOSEU NEGATIVE 08/23/2023 0926   HGBUR SMALL (A)  08/23/2023 0926   BILIRUBINUR NEGATIVE 08/23/2023 0926   BILIRUBINUR Negative 04/13/2023 1455   KETONESUR 20 (A) 08/23/2023 0926   PROTEINUR 30 (A) 08/23/2023 0926   NITRITE NEGATIVE 08/23/2023 0926   LEUKOCYTESUR NEGATIVE 08/23/2023 0926    Lab Results  Component Value Date   LABMICR See below: 04/13/2023   WBCUA None seen 04/13/2023   RBCUA None seen 03/12/2018   LABEPIT 0-10 04/13/2023   MUCUS Present (A) 03/06/2022   BACTERIA RARE (A) 08/23/2023    Pertinent Imaging:   Assessment & Plan:    1. BPH with obstruction/lower urinary tract symptoms (Primary) Cont tams and finasteride . No anatomic predisposition to UTI but memory issues and parkinson increase risk. - Urinalysis, Routine w reflex microscopic - Cystoscopy - ciprofloxacin  (CIPRO ) tablet 500 mg  2. H/o bladder ca - benign cystoscopy   No follow-ups on file.  Christina Coyer, MD  [facility]  [address] [place], [place] [address] [phone]

## 2023-12-14 ENCOUNTER — Inpatient Hospital Stay (HOSPITAL_COMMUNITY)
Admission: EM | Admit: 2023-12-14 | Discharge: 2023-12-18 | DRG: 521 | Disposition: A | Discharge status: Home Health | Attending: Internal Medicine | Admitting: Internal Medicine

## 2023-12-14 ENCOUNTER — Emergency Department (HOSPITAL_COMMUNITY)

## 2023-12-14 DIAGNOSIS — G629 Polyneuropathy, unspecified: Secondary | ICD-10-CM | POA: Diagnosis present

## 2023-12-14 DIAGNOSIS — E7800 Pure hypercholesterolemia, unspecified: Secondary | ICD-10-CM | POA: Diagnosis not present

## 2023-12-14 DIAGNOSIS — S72001A Fracture of unspecified part of neck of right femur, initial encounter for closed fracture: Principal | ICD-10-CM

## 2023-12-14 DIAGNOSIS — Z9181 History of falling: Secondary | ICD-10-CM | POA: Diagnosis not present

## 2023-12-14 DIAGNOSIS — W1839XA Other fall on same level, initial encounter: Secondary | ICD-10-CM | POA: Diagnosis present

## 2023-12-14 DIAGNOSIS — E785 Hyperlipidemia, unspecified: Secondary | ICD-10-CM | POA: Diagnosis present

## 2023-12-14 DIAGNOSIS — Z7982 Long term (current) use of aspirin: Secondary | ICD-10-CM

## 2023-12-14 DIAGNOSIS — G20A1 Parkinson's disease without dyskinesia, without mention of fluctuations: Secondary | ICD-10-CM | POA: Diagnosis present

## 2023-12-14 DIAGNOSIS — D509 Iron deficiency anemia, unspecified: Secondary | ICD-10-CM | POA: Diagnosis present

## 2023-12-14 DIAGNOSIS — F0280 Dementia in other diseases classified elsewhere without behavioral disturbance: Secondary | ICD-10-CM | POA: Diagnosis present

## 2023-12-14 DIAGNOSIS — I10 Essential (primary) hypertension: Secondary | ICD-10-CM | POA: Diagnosis present

## 2023-12-14 DIAGNOSIS — Z886 Allergy status to analgesic agent status: Secondary | ICD-10-CM

## 2023-12-14 DIAGNOSIS — J189 Pneumonia, unspecified organism: Secondary | ICD-10-CM | POA: Diagnosis present

## 2023-12-14 DIAGNOSIS — Z833 Family history of diabetes mellitus: Secondary | ICD-10-CM

## 2023-12-14 DIAGNOSIS — Z8551 Personal history of malignant neoplasm of bladder: Secondary | ICD-10-CM | POA: Diagnosis not present

## 2023-12-14 DIAGNOSIS — Z96652 Presence of left artificial knee joint: Secondary | ICD-10-CM | POA: Diagnosis present

## 2023-12-14 DIAGNOSIS — Z803 Family history of malignant neoplasm of breast: Secondary | ICD-10-CM | POA: Diagnosis not present

## 2023-12-14 DIAGNOSIS — I3139 Other pericardial effusion (noninflammatory): Secondary | ICD-10-CM | POA: Diagnosis present

## 2023-12-14 DIAGNOSIS — Z823 Family history of stroke: Secondary | ICD-10-CM | POA: Diagnosis not present

## 2023-12-14 DIAGNOSIS — M48061 Spinal stenosis, lumbar region without neurogenic claudication: Secondary | ICD-10-CM | POA: Diagnosis present

## 2023-12-14 DIAGNOSIS — N400 Enlarged prostate without lower urinary tract symptoms: Secondary | ICD-10-CM | POA: Diagnosis present

## 2023-12-14 DIAGNOSIS — I2510 Atherosclerotic heart disease of native coronary artery without angina pectoris: Secondary | ICD-10-CM | POA: Diagnosis present

## 2023-12-14 DIAGNOSIS — Z66 Do not resuscitate: Secondary | ICD-10-CM | POA: Diagnosis present

## 2023-12-14 DIAGNOSIS — M810 Age-related osteoporosis without current pathological fracture: Secondary | ICD-10-CM | POA: Diagnosis present

## 2023-12-14 DIAGNOSIS — Z7409 Other reduced mobility: Secondary | ICD-10-CM | POA: Diagnosis present

## 2023-12-14 DIAGNOSIS — S72141A Displaced intertrochanteric fracture of right femur, initial encounter for closed fracture: Secondary | ICD-10-CM | POA: Diagnosis present

## 2023-12-14 DIAGNOSIS — Z1152 Encounter for screening for COVID-19: Secondary | ICD-10-CM

## 2023-12-14 DIAGNOSIS — Z888 Allergy status to other drugs, medicaments and biological substances status: Secondary | ICD-10-CM

## 2023-12-14 DIAGNOSIS — Z96641 Presence of right artificial hip joint: Secondary | ICD-10-CM

## 2023-12-14 LAB — BASIC METABOLIC PANEL WITH GFR
Anion gap: 10 (ref 5–15)
BUN: 15 mg/dL (ref 8–23)
CO2: 30 mmol/L (ref 22–32)
Calcium: 9.7 mg/dL (ref 8.9–10.3)
Chloride: 98 mmol/L (ref 98–111)
Creatinine, Ser: 0.72 mg/dL (ref 0.61–1.24)
GFR, Estimated: >60 mL/min (ref >60)
Glucose, Bld: 103 mg/dL — ABNORMAL HIGH (ref 70–99)
Potassium: 3.9 mmol/L (ref 3.5–5.1)
Sodium: 138 mmol/L (ref 135–145)

## 2023-12-14 LAB — CBC WITH DIFFERENTIAL/PLATELET
Abs Immature Granulocytes: 0.01 K/uL (ref 0.00–0.07)
Basophils Absolute: 0 K/uL (ref 0.0–0.1)
Basophils Relative: 0 %
Eosinophils Absolute: 0 K/uL (ref 0.0–0.5)
Eosinophils Relative: 1 %
HCT: 41.7 % (ref 39.0–52.0)
Hemoglobin: 12.3 g/dL — ABNORMAL LOW (ref 13.0–17.0)
Immature Granulocytes: 0 %
Lymphocytes Relative: 28 %
Lymphs Abs: 1.4 K/uL (ref 0.7–4.0)
MCH: 23.5 pg — ABNORMAL LOW (ref 26.0–34.0)
MCHC: 29.5 g/dL — ABNORMAL LOW (ref 30.0–36.0)
MCV: 79.6 fL — ABNORMAL LOW (ref 80.0–100.0)
Monocytes Absolute: 0.5 K/uL (ref 0.1–1.0)
Monocytes Relative: 10 %
Neutro Abs: 3.2 K/uL (ref 1.7–7.7)
Neutrophils Relative %: 61 %
Platelets: 304 K/uL (ref 150–400)
RBC: 5.24 MIL/uL (ref 4.22–5.81)
RDW: 16.9 % — ABNORMAL HIGH (ref 11.5–15.5)
WBC: 5.2 K/uL (ref 4.0–10.5)
nRBC: 0 % (ref 0.0–0.2)

## 2023-12-14 LAB — RESP PANEL BY RT-PCR (RSV, FLU A&B, COVID)  RVPGX2
Influenza A by PCR: NEGATIVE
Influenza B by PCR: NEGATIVE
Resp Syncytial Virus by PCR: NEGATIVE
SARS Coronavirus 2 by RT PCR: NEGATIVE

## 2023-12-14 MED ORDER — ENSURE ENLIVE PO LIQD
237.0000 mL | Freq: Two times a day (BID) | ORAL | Status: DC
  Administered 2023-12-16 – 2023-12-18 (×4): 237 mL via ORAL
  Filled 2023-12-14 (×6): qty 237

## 2023-12-14 MED ORDER — PANTOPRAZOLE SODIUM 40 MG PO TBEC
40.0000 mg | DELAYED_RELEASE_TABLET | Freq: Every day | ORAL | Status: DC
  Administered 2023-12-15 – 2023-12-18 (×4): 40 mg via ORAL
  Filled 2023-12-14 (×4): qty 1

## 2023-12-14 MED ORDER — ACETAMINOPHEN 500 MG PO TABS
1000.0000 mg | ORAL_TABLET | Freq: Four times a day (QID) | ORAL | Status: DC | PRN
  Administered 2023-12-14: 1000 mg via ORAL
  Filled 2023-12-14: qty 2

## 2023-12-14 MED ORDER — OXYCODONE HCL 5 MG PO TABS: 2.5000 mg | ORAL_TABLET | ORAL | Status: DC | PRN

## 2023-12-14 MED ORDER — OXYCODONE HCL 5 MG PO TABS: 5.0000 mg | ORAL_TABLET | ORAL | Status: DC | PRN

## 2023-12-14 MED ORDER — ONDANSETRON HCL 4 MG/2ML IJ SOLN: 4.0000 mg | Freq: Four times a day (QID) | INTRAMUSCULAR | Status: DC | PRN

## 2023-12-14 MED ORDER — AMOXICILLIN 500 MG PO CAPS: 500.0000 mg | ORAL_CAPSULE | Freq: Two times a day (BID) | ORAL | Status: DC

## 2023-12-14 MED ORDER — TAMSULOSIN HCL 0.4 MG PO CAPS
0.4000 mg | ORAL_CAPSULE | Freq: Every day | ORAL | Status: DC
  Administered 2023-12-14 – 2023-12-17 (×3): 0.4 mg via ORAL
  Filled 2023-12-14 (×3): qty 1

## 2023-12-14 MED ORDER — MELATONIN 3 MG PO TABS
6.0000 mg | ORAL_TABLET | Freq: Every day | ORAL | Status: DC
  Administered 2023-12-14 – 2023-12-15 (×2): 6 mg via ORAL
  Filled 2023-12-14 (×2): qty 2

## 2023-12-14 MED ORDER — FINASTERIDE 5 MG PO TABS
5.0000 mg | ORAL_TABLET | Freq: Every day | ORAL | Status: DC
  Administered 2023-12-15 – 2023-12-18 (×4): 5 mg via ORAL
  Filled 2023-12-14 (×4): qty 1

## 2023-12-14 MED ORDER — VITAMIN D 25 MCG (1000 UNIT) PO TABS: 1000.0000 [IU] | ORAL_TABLET | Freq: Every day | ORAL | Status: DC

## 2023-12-14 MED ORDER — HYDROMORPHONE HCL 1 MG/ML IJ SOLN
0.5000 mg | INTRAMUSCULAR | Status: DC | PRN
  Administered 2023-12-15: .5 mg via INTRAVENOUS

## 2023-12-14 MED ORDER — SODIUM CHLORIDE 0.9% FLUSH
3.0000 mL | Freq: Two times a day (BID) | INTRAVENOUS | Status: DC
  Administered 2023-12-14 – 2023-12-17 (×6): 3 mL via INTRAVENOUS

## 2023-12-14 MED ORDER — ALBUTEROL SULFATE (2.5 MG/3ML) 0.083% IN NEBU: 2.5000 mg | INHALATION_SOLUTION | RESPIRATORY_TRACT | Status: DC | PRN

## 2023-12-14 MED ORDER — MORPHINE SULFATE (PF) 2 MG/ML IV SOLN
2.0000 mg | Freq: Once | INTRAVENOUS | Status: AC
  Administered 2023-12-14: 2 mg via INTRAVENOUS
  Filled 2023-12-14: qty 1

## 2023-12-14 MED ORDER — AMOXICILLIN-POT CLAVULANATE 875-125 MG PO TABS
1.0000 | ORAL_TABLET | Freq: Two times a day (BID) | ORAL | Status: DC
  Administered 2023-12-14 – 2023-12-15 (×2): 1 via ORAL
  Filled 2023-12-14 (×2): qty 1

## 2023-12-14 MED ORDER — VITAMIN D 25 MCG (1000 UNIT) PO TABS
1000.0000 [IU] | ORAL_TABLET | Freq: Every day | ORAL | Status: DC
  Administered 2023-12-15 – 2023-12-18 (×4): 1000 [IU] via ORAL
  Filled 2023-12-14 (×4): qty 1

## 2023-12-14 MED ORDER — POLYETHYLENE GLYCOL 3350 17 G PO PACK: 17.0000 g | PACK | Freq: Every day | ORAL | Status: DC | PRN

## 2023-12-14 MED ORDER — MELATONIN 3 MG PO TABS: 6.0000 mg | ORAL_TABLET | Freq: Every evening | ORAL | Status: DC | PRN

## 2023-12-14 NOTE — ED Provider Notes (Signed)
 [facility] Provider Note   CSN: [id number] Arrival date & time: 12/14/23  1440     History  No chief complaint on file.   Travis [name] is a 89 y.o. male with Parkinson's disease, dementia;who presents BIB EMS due to R hip fracture. History provided by daughter at bedside. Patient had fall back at end of June and xrays showed no fracture. He is normally ambulatory. Patient has been continuing to have pain and difficulty ambulating. Patient had MRI done three weeks ago at [facility] and MRI confirmed hip fracture. PCP informed patient to come to ED. Pain he states is okay right now.  Daughter shows reports from [place] on phone - had CT lumbar spine w/ degenerative changes, no fx. Had CT pelvis that showed displaced R femoral neck fx. Had CT thorax that showed atelectasis on L and possible PNA on R; daughter reports he has had a cough.  Has been on amoxicillin  since 12/09/23 and has two days left.    Past Medical History:  Diagnosis Date   Arthritis    Cancer (HCC)    BLADDER   Complication of anesthesia    had a hard time recovering from anesthesia after knee replacement neuro symptoms   Dementia (HCC)    Hyperlipidemia    Parkinson disease (HCC)        Home Medications Prior to Admission medications   Medication Sig Start Date End Date Taking? Authorizing Provider  acetaminophen  (TYLENOL ) 325 MG tablet Take 650 mg by mouth every 8 (eight) hours as needed for moderate pain (pain score 4-6). 11/09/23  Yes [provider]  acetaminophen  (TYLENOL ) 650 MG CR tablet Take 1,300 mg by mouth daily as needed for pain.   Yes [provider]  amoxicillin -clavulanate (AUGMENTIN ) 875-125 MG tablet Take 1 tablet by mouth 2 (two) times daily. For pneumonia 12/03/23  Yes [provider]  ascorbic acid  (VITAMIN C ) 500 MG tablet Take 1 tablet (500 mg total) by mouth daily. 10/24/22  Yes Regalado, Belkys A, MD  camphor-menthol   (SARNA) lotion Apply 1 Application topically 2 (two) times daily as needed (arthritis). 08/13/23  Yes [provider]  carboxymethylcellulose 1 % ophthalmic solution Apply 1 drop to eye 4 (four) times daily as needed (dry eye).   Yes [provider]  Carboxymethylcellulose Sod PF 1 % GEL Apply 1 application  to eye in the morning and at bedtime.   Yes [provider]  cetirizine (ZYRTEC) 10 MG tablet Take 10 mg by mouth daily.   Yes [provider]  diclofenac  Sodium (VOLTAREN ) 1 % GEL Apply 2 g topically 4 (four) times daily. 08/27/23  Yes Tobie Yetta HERO, MD  feeding supplement (ENSURE ENLIVE / ENSURE PLUS) LIQD Take 237 mLs by mouth 2 (two) times daily between meals. 08/27/23  Yes Tobie Yetta HERO, MD  finasteride  (PROSCAR ) 5 MG tablet Take 1 tablet (5 mg total) by mouth daily. 04/13/23  Yes Nieves Cough, MD  furosemide (LASIX) 20 MG tablet Take 20 mg by mouth daily as needed for edema. 08/13/23  Yes [provider]  ketotifen (ZADITOR) 0.035 % ophthalmic solution Place 1 drop into both eyes daily as needed (allergies). 12/10/23  Yes [provider]  pantoprazole  (PROTONIX ) 40 MG tablet Take 40 mg by mouth daily. 05/15/21  Yes [provider]  tamsulosin  (FLOMAX ) 0.4 MG CAPS capsule Take 1 capsule (0.4 mg total) by mouth daily after supper. 11/24/22  Yes Nieves Cough,  MD  Vibegron  (GEMTESA ) 75 MG TABS Take 1 tablet (75 mg total) by mouth daily. 04/13/23  Yes Nieves Cough, MD  vitamin B-12 (CYANOCOBALAMIN ) 1000 MCG tablet Take 1,000 mcg by mouth daily.    Yes [provider]  VITAMIN D  PO Take 1 tablet by mouth daily.   Yes [provider]  Zinc  Oxide 12 % CREA Apply 1 application  topically daily. 08/23/23  Yes [provider]  azithromycin (ZITHROMAX) 250 MG tablet Take 500 mg by mouth daily. Patient not taking: Reported on 12/14/2023 12/03/23   [provider]      Allergies    Sinemet   [carbidopa -levodopa ], Ditropan  [oxybutynin ], Enablex  [darifenacin ], Gabapentin , Myrbetriq  [mirabegron ], Propofol , and Celebrex [celecoxib]    Review of Systems   Review of Systems A 10 point review of systems was performed and is negative unless otherwise reported in HPI.  Physical Exam Updated Vital Signs BP 128/64 (BP Location: Right Arm)   Pulse 60   Temp (!) 97.4 F (36.3 C) (Oral)   Resp 18   SpO2 95%  Physical Exam General: Normal appearing elderly male, lying in bed.  HEENT: PERRLA, Sclera anicteric, MMM, trachea midline. Masked facies. Cardiology: RRR, no murmurs/rubs/gallops.  Resp: Normal respiratory rate and effort. CTAB, no wheezes, rhonchi, crackles.  Abd: Soft, non-tender, non-distended. No rebound tenderness or guarding.  GU: Deferred. MSK: No peripheral edema or signs of trauma. Limited ROM in R hip d/t pain. Intact distal pulses in BL LEs. TTP over R greater trochanger and anterior R hip.  Skin: warm, dry.  Neuro: Alert, able to answer some questions and provide some history (daughter states he is at baseline), CNs II-XII grossly intact. MAEs. Sensation grossly intact.  Psych: Normal mood and affect.   ED Results / Procedures / Treatments   Labs (all labs ordered are listed, but only abnormal results are displayed) Labs Reviewed  CBC WITH DIFFERENTIAL/PLATELET - Abnormal; Notable for the following components:      Result Value   Hemoglobin 12.3 (*)    MCV 79.6 (*)    MCH 23.5 (*)    MCHC 29.5 (*)    RDW 16.9 (*)    All other components within normal limits  BASIC METABOLIC PANEL WITH GFR - Abnormal; Notable for the following components:   Glucose, Bld 103 (*)    All other components within normal limits  RESP PANEL BY RT-PCR (RSV, FLU A&B, COVID)  [id number]    EKG None  Radiology DG Chest Portable 1 View Result Date: 12/14/2023 CLINICAL DATA:  R femoral neck fx on CT scan from 12/02/23; RLL PNA on CT chest noncon EXAM: PORTABLE CHEST - 1 VIEW COMPARISON:   August 25, 2023, August 23, 2023 FINDINGS: Low lung volumes. Skin fold artifact along the left lateral chest. No focal airspace consolidation, pleural effusion, or pneumothorax. Mild cardiomegaly. No acute fracture or destructive lesion. IMPRESSION: Low lung volumes.  Otherwise, no acute cardiopulmonary abnormality. Electronically Signed   By: Rogelia Myers M.D.   On: 12/14/2023 16:56   DG Hip Unilat W or Wo Pelvis 2-3 Views Right Result Date: 12/14/2023 CLINICAL DATA:  R femoral neck fx on CT scan from 12/02/23; RLL PNA on CT chest noncon EXAM: DG HIP (WITH OR WITHOUT PELVIS) 2-3V RIGHT COMPARISON:  None Available. FINDINGS: No evidence of pelvic fracture or diastasis.Superiorly displaced, impacted fracture of the intertrochanteric right femoral neck.There is no evidence of arthropathy or other focal bone abnormality.Soft tissues are unremarkable. IMPRESSION: Superiorly displaced, impacted fracture of  the right intertrochanteric femoral neck. Electronically Signed   By: Rogelia Myers M.D.   On: 12/14/2023 16:53    Procedures Procedures    Medications Ordered in ED Medications  amoxicillin  (AMOXIL ) capsule 500 mg (has no administration in time range)  morphine  (PF) 2 MG/ML injection 2 mg (2 mg Intravenous Given 12/14/23 1722)    ED Course/ Medical Decision Making/ A&P                          Medical Decision Making Amount and/or Complexity of Data Reviewed Labs: ordered. Radiology: ordered. Decision-making details documented in ED Course.  Risk Prescription drug management. Decision regarding hospitalization.    This patient presents to the ED for concern of hip fracture, this involves an extensive number of treatment options, and is a complaint that carries with it a high risk of complications and morbidity.  I considered the following differential and admission for this acute, potentially life threatening condition.   MDM:    XRs demonstrate R displaced impacted femoral neck fx.  History obtained states that patient was ambulatory and he had had difficulty ambulating and CT was done 3 weeks ago. Patient has had this hip fracture for at least 3 weeks. He has no compartment syndrome and is NVI. Consulted to [facility] Dr. Burnetta who will see the patient and operate likely tomorrow.  Presumed to have had PNA as well. Has been taking amoxicillin , has just two days left. Has some mild dry cough. No tachycardia/fever/leukocytosis to indicate sepsis. No findings on CXR. Can continue remaining two days of amoxicillin  to finish course.   Clinical Course as of 12/14/23 1907  Mon Dec 14, 2023  1728 DG Chest Portable 1 View Low lung volumes.  Otherwise, no acute cardiopulmonary abnormality. [HN]  1728 DG Hip Unilat W or Wo Pelvis 2-3 Views Right Superiorly displaced, impacted fracture of the right intertrochanteric femoral neck.   [HN]    Clinical Course User Index [HN] Franklyn Sid SAILOR, MD    Labs: I Ordered, and personally interpreted labs.  The pertinent results include:  those listed above  Imaging Studies ordered: I ordered imaging studies including R hip XR, CXR I independently visualized and interpreted imaging. I agree with the radiologist interpretation  Additional history obtained from chart review.    Reevaluation: After the interventions noted above, I reevaluated the patient and found that they have :improved  Social Determinants of Health: Lives with daughter  Disposition:  Admit to hospitalist  Co morbidities that complicate the patient evaluation  Past Medical History:  Diagnosis Date   Arthritis    Cancer (HCC)    BLADDER   Complication of anesthesia    had a hard time recovering from anesthesia after knee replacement neuro symptoms   Dementia (HCC)    Hyperlipidemia    Parkinson disease (HCC)      Medicines Meds ordered this encounter  Medications   morphine  (PF) 2 MG/ML injection 2 mg   amoxicillin  (AMOXIL ) capsule 500 mg    I  have reviewed the patients home medicines and have made adjustments as needed  Problem List / ED Course: Problem List Items Addressed This Visit   None Visit Diagnoses       Displaced fracture of right femoral neck (HCC)    -  Primary                   This note was created using dictation software, which may  contain spelling or grammatical errors.    Franklyn Sid SAILOR, MD 12/14/23 [phone]

## 2023-12-14 NOTE — H&P (Signed)
 History and Physical    Travis [name] FMW:[id number] DOB: July 10, 1931 DOA: 12/14/2023  PCP: Lenon Layman LELON, MD   Patient coming from: Home   Chief Complaint: Fall, R hip Fx    HPI:  Travis [name] is a 89 y.o. male with hx of Parkinsons disease with dementia, recurrent falls, bladder CA s/p TURBT, HTN, Neuropathy, IDA,  BPH, who was referred to ED by [facility] after delayed report of R hip fracture. Reportedly fell in June and had persistent pain in the R hip and difficulty ambulating. Initial imaging per daughter 6/29 at time of fall was negative. Had repeat imaging on 7/23 and notified of R hip Fx on the 24th. Daughter had tried getting in touch with [facility] and ultimately today they got back to her and referred her to the ED. He has had pain in the R hip and difficulty ambulating. Otherwise recent cough nonproductive, no SOB, fever. Denies any hx of chest pain.     Review of Systems:  ROS complete and negative except as marked above   Allergies  Allergen Reactions   Sinemet  [Carbidopa -Levodopa ] Other (See Comments)    -Ataxia -Hallucinations   Ditropan  [Oxybutynin ] Other (See Comments)    -Hallucinations   Enablex  [Darifenacin ] Other (See Comments)    -Hallucinations   Gabapentin  Other (See Comments)    -Hallucinations   Myrbetriq  Conner.Condon ] Other (See Comments)    -Hallucinations   Propofol  Other (See Comments)    -Hallucinations   Celebrex [Celecoxib] Other (See Comments)    -excessive salivation    Prior to Admission medications   Medication Sig Start Date End Date Taking? Authorizing Provider  acetaminophen  (TYLENOL ) 325 MG tablet Take 650 mg by mouth every 8 (eight) hours as needed for moderate pain (pain score 4-6). 11/09/23  Yes [provider]  acetaminophen  (TYLENOL ) 650 MG CR tablet Take 1,300 mg by mouth daily as needed for pain.   Yes [provider]  amoxicillin -clavulanate (AUGMENTIN ) 875-125 MG tablet Take 1 tablet by mouth 2 (two)  times daily. For pneumonia 12/03/23  Yes [provider]  ascorbic acid  (VITAMIN C ) 500 MG tablet Take 1 tablet (500 mg total) by mouth daily. 10/24/22  Yes Regalado, Belkys A, MD  camphor-menthol  (SARNA) lotion Apply 1 Application topically 2 (two) times daily as needed (arthritis). 08/13/23  Yes [provider]  carboxymethylcellulose 1 % ophthalmic solution Apply 1 drop to eye 4 (four) times daily as needed (dry eye).   Yes [provider]  Carboxymethylcellulose Sod PF 1 % GEL Apply 1 application  to eye in the morning and at bedtime.   Yes [provider]  cetirizine (ZYRTEC) 10 MG tablet Take 10 mg by mouth daily.   Yes [provider]  diclofenac  Sodium (VOLTAREN ) 1 % GEL Apply 2 g topically 4 (four) times daily. 08/27/23  Yes Tobie Yetta HERO, MD  feeding supplement (ENSURE ENLIVE / ENSURE PLUS) LIQD Take 237 mLs by mouth 2 (two) times daily between meals. 08/27/23  Yes Tobie Yetta HERO, MD  finasteride  (PROSCAR ) 5 MG tablet Take 1 tablet (5 mg total) by mouth daily. 04/13/23  Yes Nieves Cough, MD  furosemide (LASIX) 20 MG tablet Take 20 mg by mouth daily as needed for edema. 08/13/23  Yes [provider]  ketotifen (ZADITOR) 0.035 % ophthalmic solution Place 1 drop into both eyes daily as needed (allergies). 12/10/23  Yes [provider]  pantoprazole  (PROTONIX ) 40 MG tablet Take 40 mg by mouth daily. 05/15/21  Yes [provider]  tamsulosin  (FLOMAX ) 0.4 MG CAPS capsule Take 1 capsule (0.4 mg total) by mouth daily after supper. 11/24/22  Yes Nieves Cough, MD  Vibegron  (GEMTESA ) 75 MG TABS Take 1 tablet (75 mg total) by mouth daily. 04/13/23  Yes Nieves Cough, MD  vitamin B-12 (CYANOCOBALAMIN ) 1000 MCG tablet Take 1,000 mcg by mouth daily.    Yes [provider]  VITAMIN D  PO Take 1 tablet by mouth daily.   Yes [provider]  Zinc  Oxide 12 % CREA Apply 1 application  topically daily. 08/23/23  Yes  [provider]  azithromycin (ZITHROMAX) 250 MG tablet Take 500 mg by mouth daily. Patient not taking: Reported on 12/14/2023 12/03/23   [provider]    Past Medical History:  Diagnosis Date   Arthritis    Cancer (HCC)    BLADDER   Complication of anesthesia    had a hard time recovering from anesthesia after knee replacement neuro symptoms   Dementia (HCC)    Hyperlipidemia    Parkinson disease (HCC)     Past Surgical History:  Procedure Laterality Date   eye implants  1992   EYE SURGERY Bilateral    Cataract Extraction with IOL Implants   HERNIA REPAIR Right 1992   Inguinal Hernia Repair   INGUINAL HERNIA REPAIR Left 01/03/2016   Procedure: LAPAROSCOPIC INGUINAL HERNIA;  Surgeon: Laneta JULIANNA Luna, MD;  Location: [facility] ORS;  Service: General;  Laterality: Left;   INGUINAL HERNIA REPAIR Left 06/01/2019   Procedure: HERNIA REPAIR INGUINAL ADULT;  Surgeon: Rodolph Romano, MD;  Location: [facility] ORS;  Service: General;  Laterality: Left;   JOINT REPLACEMENT     TOTAL KNEE ARTHROPLASTY Left 03/18/2016   Procedure: TOTAL KNEE ARTHROPLASTY;  Surgeon: Ozell Flake, MD;  Location: [facility] ORS;  Service: Orthopedics;  Laterality: Left;   TRANSURETHRAL RESECTION OF BLADDER TUMOR N/A 09/19/2016   Procedure: TRANSURETHRAL RESECTION OF BLADDER TUMOR (TURBT) MEDIUM;  Surgeon: Chauncey Redell Agent, MD;  Location: [facility] ORS;  Service: Urology;  Laterality: N/A;     reports that he has never smoked. He has never used smokeless tobacco. He reports that he does not drink alcohol and does not use drugs.  Family History  Problem Relation Age of Onset   Stroke Father    Diabetes Sister    Breast cancer Sister    Cancer Maternal Aunt    Hematuria Neg Hx    Renal cancer Neg Hx    Prostate cancer Neg Hx    Bladder Cancer Neg Hx      Physical Exam: Vitals:   12/14/23 1500 12/14/23 1512 12/14/23 1857 12/14/23 1858  BP: 126/72 116/68 128/64 128/64  Pulse: (!) 51 (!) 54 60 60   Resp: 16 18  18   Temp: 98.4 F (36.9 C) 97.6 F (36.4 C)  (!) 97.4 F (36.3 C)  TempSrc: Oral Oral Oral Oral  SpO2: 99% 95% 95% 95%    Gen: Awake, alert, chronically ill appearing   CV: Regular, normal S1, S2, no murmurs  Resp: Normal WOB, CTAB  Abd: Flat, normoactive, nontender MSK: Symmetric, no edema, distally NV intact on the R   Skin: No rashes or lesions to exposed skin  Neuro: Alert and interactive  Psych: euthymic, appropriate    Data review:   Labs reviewed, notable for:   WBC 5, Hb 12  Chemistries unremarkable   Micro:  Results for orders placed or performed during the hospital encounter of 12/14/23  Resp panel  by RT-PCR (RSV, Flu A&B, Covid) Anterior Nasal Swab     Status: None   Collection Time: 12/14/23  4:01 PM   Specimen: Anterior Nasal Swab  Result Value Ref Range Status   SARS Coronavirus 2 by RT PCR NEGATIVE NEGATIVE Final    Comment: (NOTE) SARS-CoV-2 target nucleic acids are NOT DETECTED.  The SARS-CoV-2 RNA is generally detectable in upper respiratory specimens during the acute phase of infection. The lowest concentration of SARS-CoV-2 viral copies this assay can detect is 138 copies/mL. A negative result does not preclude SARS-Cov-2 infection and should not be used as the sole basis for treatment or other patient management decisions. A negative result may occur with  improper specimen collection/handling, submission of specimen other than nasopharyngeal swab, presence of viral mutation(s) within the areas targeted by this assay, and inadequate number of viral copies(<138 copies/mL). A negative result must be combined with clinical observations, patient history, and epidemiological information. The expected result is Negative.  Fact Sheet for Patients:  BloggerCourse.com  Fact Sheet for Healthcare Providers:  SeriousBroker.it  This test is no t yet approved or cleared by the [place]   FDA and  has been authorized for detection and/or diagnosis of SARS-CoV-2 by FDA under an Emergency Use Authorization (EUA). This EUA will remain  in effect (meaning this test can be used) for the duration of the COVID-19 declaration under Section 564(b)(1) of the Act, 21 U.S.C.section 360bbb-3(b)(1), unless the authorization is terminated  or revoked sooner.       Influenza A by PCR NEGATIVE NEGATIVE Final   Influenza B by PCR NEGATIVE NEGATIVE Final    Comment: (NOTE) The Xpert Xpress SARS-CoV-2/FLU/RSV plus assay is intended as an aid in the diagnosis of influenza from Nasopharyngeal swab specimens and should not be used as a sole basis for treatment. Nasal washings and aspirates are unacceptable for Xpert Xpress SARS-CoV-2/FLU/RSV testing.  Fact Sheet for Patients: BloggerCourse.com  Fact Sheet for Healthcare Providers: SeriousBroker.it  This test is not yet approved or cleared by the [place]  FDA and has been authorized for detection and/or diagnosis of SARS-CoV-2 by FDA under an Emergency Use Authorization (EUA). This EUA will remain in effect (meaning this test can be used) for the duration of the COVID-19 declaration under Section 564(b)(1) of the Act, 21 U.S.C. section 360bbb-3(b)(1), unless the authorization is terminated or revoked.     Resp Syncytial Virus by PCR NEGATIVE NEGATIVE Final    Comment: (NOTE) Fact Sheet for Patients: BloggerCourse.com  Fact Sheet for Healthcare Providers: SeriousBroker.it  This test is not yet approved or cleared by the [place]  FDA and has been authorized for detection and/or diagnosis of SARS-CoV-2 by FDA under an Emergency Use Authorization (EUA). This EUA will remain in effect (meaning this test can be used) for the duration of the COVID-19 declaration under Section 564(b)(1) of the Act, 21 U.S.C. section  360bbb-3(b)(1), unless the authorization is terminated or revoked.  Performed at [facility], [address] [place], [place] [address]     Imaging reviewed:  DG Chest Portable 1 View Result Date: 12/14/2023 CLINICAL DATA:  R femoral neck fx on CT scan from 12/02/23; RLL PNA on CT chest noncon EXAM: PORTABLE CHEST - 1 VIEW COMPARISON:  August 25, 2023, August 23, 2023 FINDINGS: Low lung volumes. Skin fold artifact along the left lateral chest. No focal airspace consolidation, pleural effusion, or pneumothorax. Mild cardiomegaly. No acute fracture or destructive lesion. IMPRESSION: Low lung volumes.  Otherwise, no acute  cardiopulmonary abnormality. Electronically Signed   By: Rogelia Myers M.D.   On: 12/14/2023 16:56   DG Hip Unilat W or Wo Pelvis 2-3 Views Right Result Date: 12/14/2023 CLINICAL DATA:  R femoral neck fx on CT scan from 12/02/23; RLL PNA on CT chest noncon EXAM: DG HIP (WITH OR WITHOUT PELVIS) 2-3V RIGHT COMPARISON:  None Available. FINDINGS: No evidence of pelvic fracture or diastasis.Superiorly displaced, impacted fracture of the intertrochanteric right femoral neck.There is no evidence of arthropathy or other focal bone abnormality.Soft tissues are unremarkable. IMPRESSION: Superiorly displaced, impacted fracture of the right intertrochanteric femoral neck. Electronically Signed   By: Rogelia Myers M.D.   On: 12/14/2023 16:53    EKG: pending    ED Course:  EDP consulted with Orthopedics  [facility] Dr. Burnetta, likely plan for OR tomorrow. Ordered for Augmentin  for recent PNA treated outpatient, and treated with morphine     Assessment/Plan:  89 y.o. male with hx Parkinsons disease with dementia, recurrent falls, bladder CA s/p TURBT, HTN, Neuropathy, IDA,  BPH, who was referred to ED by [facility] for R hip fracture.   R impacted, displaced femoral neck/intertrochanteric fracture Recurrent Ground-level fall Delayed presentation, fall in June with  negative imaging, ? Fall in interim seems likely although daughter denies. Imaging at [facility] 7/23 with R hip fracture, notified on the 24th. I have uploaded report to media tab. Daughter had been trying to arrange eval by [facility] and ultimately referred to ER. Fx confirmed on repeat imaging here.  -EDP consulted with Orthopedics  [facility] Dr. Burnetta, likely plan for OR tomorrow. -- Discussed with daughter that he has elevated risk for surgery with his age and comorbidities, findings including advanced coronary atherosclerosis by imaging. But no hx of chest pain or HF symptoms. Will obtain routine EKG, and BNP. Do not feel any additional cardiac testing needed at this time preop.  -N.p.o. after midnight -DVT prophylaxis to be readdressed postop; SCD for now  -Tele monitoring periop -PT/ OT eval postop ordered   -- Once post op, with his parkinsons prudent to check orthostatics to ensure no orthostasis contributing to falls  -Pain mgmt: Tylenol  prn for mild, oxycodone  2.5/5 mg p.o. every 4 hours prn for moderate/severe, Dilaudid  0.5 mg IV every 4 hours prn for breakthrough   Community acquired pneumonia  Dx via CT on 7/23, and started on treatment on the 30th with Augmentin   -- Continue Augmentin  BID, EOT 8/6 AM for 7 day course   Clinical diagnosis osteoporosis -Check vitamin D , start vitamin D3 1000 IU daily -Will need follow-up with PCP for further treatment of osteoporosis  Incidental findings on outpatient imaging: - reports in media tab  Advanced coronary atherosclerosis  Small pericardial effusion  Aortic ectasia 4.4 cm  Severe central canal stenosis at L2-3  Multilevel neuroforaminal narrowing   Chronic medical problems:  Parkinsons with dementia: Not on medication due to AE. Cautious + sparing use of antipsychotics for agitation in setting of his parkinsons. Tele sitter ordered. Delirium, fall precautions. Scheduled melatonin  Bladder CA s/p TURBT: outpatient uro f/u  HTN: Not  on antihypertensives at time of admission  Neuropathy: Noted, not on medication  IDA: Hb 12  BPH: Continue home Tamsulosin , flomax     There is no height or weight on file to calculate BMI.    DVT prophylaxis:  SCDs Code Status:  DNR with Intubation; confirmed with daughter  Diet:  Diet Orders (From admission, onward)    None  Family Communication:  Yes discussed with daughter at bedside   Consults:  Ortho  Admission status:   Inpatient, Telemetry bed  Severity of Illness: The appropriate patient status for this patient is INPATIENT. Inpatient status is judged to be reasonable and necessary in order to provide the required intensity of service to ensure the patient's safety. The patient's presenting symptoms, physical exam findings, and initial radiographic and laboratory data in the context of their chronic comorbidities is felt to place them at high risk for further clinical deterioration. Furthermore, it is not anticipated that the patient will be medically stable for discharge from the hospital within 2 midnights of admission.   * I certify that at the point of admission it is my clinical judgment that the patient will require inpatient hospital care spanning beyond 2 midnights from the point of admission due to high intensity of service, high risk for further deterioration and high frequency of surveillance required.*   Dorn Dawson, MD [facility]  How to contact the [facility] Attending or Consulting provider 7A - 7P or covering provider during after hours 7P -7A, for this patient.  Check the care team in [facility] and look for a) attending/consulting [facility] provider listed and b) the [facility] team listed Log into [email or web address] and use [facility]'s universal password to access. If you do not have the password, please contact the hospital operator. Locate the [facility] provider you are looking for under [facility] and page to a number that you can be directly reached. If you still  have difficulty reaching the provider, please page the [facility] (Director on Call) for the Hospitalists listed on amion for assistance.  12/14/2023, 7:31 PM

## 2023-12-14 NOTE — ED Triage Notes (Signed)
 Patient BIB EMS due to R hip fracture. Patient had fall back in June and xrays showed no fracture. Patient has been continuing to have pain and difficulty ambulating. Patient had MRI done three weeks ago at [facility] and MRI confirmed hip fracture. PCP informed patient to come to ED. Pain rates pain 9/10.

## 2023-12-15 ENCOUNTER — Inpatient Hospital Stay (HOSPITAL_COMMUNITY): Admitting: Certified Registered Nurse Anesthetist

## 2023-12-15 ENCOUNTER — Inpatient Hospital Stay (HOSPITAL_COMMUNITY)

## 2023-12-15 ENCOUNTER — Encounter (HOSPITAL_COMMUNITY)
Admission: EM | Disposition: A | Discharge status: Home Health | Payer: Self-pay | Source: Home / Self Care | Attending: Internal Medicine

## 2023-12-15 ENCOUNTER — Other Ambulatory Visit: Payer: Self-pay

## 2023-12-15 ENCOUNTER — Encounter (HOSPITAL_COMMUNITY): Payer: Self-pay | Admitting: Internal Medicine

## 2023-12-15 DIAGNOSIS — Z96641 Presence of right artificial hip joint: Secondary | ICD-10-CM

## 2023-12-15 DIAGNOSIS — E7800 Pure hypercholesterolemia, unspecified: Secondary | ICD-10-CM

## 2023-12-15 DIAGNOSIS — S72001A Fracture of unspecified part of neck of right femur, initial encounter for closed fracture: Secondary | ICD-10-CM | POA: Diagnosis not present

## 2023-12-15 DIAGNOSIS — I10 Essential (primary) hypertension: Secondary | ICD-10-CM

## 2023-12-15 DIAGNOSIS — E785 Hyperlipidemia, unspecified: Secondary | ICD-10-CM

## 2023-12-15 HISTORY — PX: TOTAL HIP ARTHROPLASTY: SHX124

## 2023-12-15 LAB — BASIC METABOLIC PANEL WITH GFR
Anion gap: 11 (ref 5–15)
BUN: 17 mg/dL (ref 8–23)
CO2: 27 mmol/L (ref 22–32)
Calcium: 9.6 mg/dL (ref 8.9–10.3)
Chloride: 98 mmol/L (ref 98–111)
Creatinine, Ser: 0.52 mg/dL — ABNORMAL LOW (ref 0.61–1.24)
GFR, Estimated: >60 mL/min (ref >60)
Glucose, Bld: 111 mg/dL — ABNORMAL HIGH (ref 70–99)
Potassium: 3.7 mmol/L (ref 3.5–5.1)
Sodium: 136 mmol/L (ref 135–145)

## 2023-12-15 LAB — SURGICAL PCR SCREEN
MRSA, PCR: NEGATIVE
Staphylococcus aureus: NEGATIVE

## 2023-12-15 LAB — CBC
HCT: 38.7 % — ABNORMAL LOW (ref 39.0–52.0)
Hemoglobin: 11.9 g/dL — ABNORMAL LOW (ref 13.0–17.0)
MCH: 24.4 pg — ABNORMAL LOW (ref 26.0–34.0)
MCHC: 30.7 g/dL (ref 30.0–36.0)
MCV: 79.3 fL — ABNORMAL LOW (ref 80.0–100.0)
Platelets: 268 K/uL (ref 150–400)
RBC: 4.88 MIL/uL (ref 4.22–5.81)
RDW: 16.9 % — ABNORMAL HIGH (ref 11.5–15.5)
WBC: 6.2 K/uL (ref 4.0–10.5)
nRBC: 0 % (ref 0.0–0.2)

## 2023-12-15 LAB — PROTIME-INR
INR: 1.1 (ref 0.8–1.2)
Prothrombin Time: 14.7 s (ref 11.4–15.2)

## 2023-12-15 LAB — TYPE AND SCREEN
ABO/RH(D): O POS
Antibody Screen: NEGATIVE

## 2023-12-15 LAB — BRAIN NATRIURETIC PEPTIDE: B Natriuretic Peptide: 56.2 pg/mL (ref 0.0–100.0)

## 2023-12-15 LAB — MAGNESIUM: Magnesium: 1.7 mg/dL (ref 1.7–2.4)

## 2023-12-15 LAB — PHOSPHORUS: Phosphorus: 3.4 mg/dL (ref 2.5–4.6)

## 2023-12-15 LAB — VITAMIN D 25 HYDROXY (VIT D DEFICIENCY, FRACTURES): Vit D, 25-Hydroxy: 48.5 ng/mL (ref 30–100)

## 2023-12-15 SURGERY — ARTHROPLASTY, HIP, TOTAL, ANTERIOR APPROACH
Anesthesia: General | Site: Hip | Laterality: Right

## 2023-12-15 MED ORDER — ALUM & MAG HYDROXIDE-SIMETH 200-200-20 MG/5ML PO SUSP: 30.0000 mL | ORAL | Status: DC | PRN

## 2023-12-15 MED ORDER — ACETAMINOPHEN 500 MG PO TABS
1000.0000 mg | ORAL_TABLET | Freq: Four times a day (QID) | ORAL | Status: DC
  Administered 2023-12-15 – 2023-12-18 (×10): 1000 mg via ORAL
  Filled 2023-12-15 (×11): qty 2

## 2023-12-15 MED ORDER — DEXAMETHASONE SODIUM PHOSPHATE 10 MG/ML IJ SOLN
INTRAMUSCULAR | Status: DC | PRN
Start: 2023-12-15 — End: 2023-12-15
  Administered 2023-12-15: 5 mg via INTRAVENOUS

## 2023-12-15 MED ORDER — TRANEXAMIC ACID-NACL 1000-0.7 MG/100ML-% IV SOLN
1000.0000 mg | Freq: Once | INTRAVENOUS | Status: AC
  Administered 2023-12-15: 1000 mg via INTRAVENOUS
  Filled 2023-12-15: qty 100

## 2023-12-15 MED ORDER — METHOCARBAMOL 500 MG PO TABS
500.0000 mg | ORAL_TABLET | Freq: Four times a day (QID) | ORAL | Status: DC | PRN
  Administered 2023-12-16: 500 mg via ORAL
  Filled 2023-12-15: qty 1

## 2023-12-15 MED ORDER — METOCLOPRAMIDE HCL 5 MG PO TABS: 5.0000 mg | ORAL_TABLET | Freq: Three times a day (TID) | ORAL | Status: DC | PRN

## 2023-12-15 MED ORDER — ALBUMIN HUMAN 5 % IV SOLN: INTRAVENOUS | Status: DC | PRN

## 2023-12-15 MED ORDER — STERILE WATER FOR IRRIGATION IR SOLN
Status: DC | PRN
  Administered 2023-12-15: 2000 mL

## 2023-12-15 MED ORDER — FENTANYL CITRATE (PF) 100 MCG/2ML IJ SOLN
INTRAMUSCULAR | Status: DC | PRN
  Administered 2023-12-15 (×4): 25 ug via INTRAVENOUS

## 2023-12-15 MED ORDER — METHOCARBAMOL 1000 MG/10ML IJ SOLN: 500.0000 mg | Freq: Four times a day (QID) | INTRAMUSCULAR | Status: DC | PRN

## 2023-12-15 MED ORDER — ASPIRIN 81 MG PO CHEW
81.0000 mg | CHEWABLE_TABLET | Freq: Two times a day (BID) | ORAL | Status: DC
  Administered 2023-12-15 – 2023-12-18 (×6): 81 mg via ORAL
  Filled 2023-12-15 (×7): qty 1

## 2023-12-15 MED ORDER — ONDANSETRON HCL 4 MG/2ML IJ SOLN: 4.0000 mg | Freq: Four times a day (QID) | INTRAMUSCULAR | Status: DC | PRN

## 2023-12-15 MED ORDER — ONDANSETRON HCL 4 MG PO TABS: 4.0000 mg | ORAL_TABLET | Freq: Four times a day (QID) | ORAL | Status: DC | PRN

## 2023-12-15 MED ORDER — EPHEDRINE SULFATE-NACL 50-0.9 MG/10ML-% IV SOSY
PREFILLED_SYRINGE | INTRAVENOUS | Status: DC | PRN
Start: 2023-12-15 — End: 2023-12-15
  Administered 2023-12-15 (×2): 10 mg via INTRAVENOUS

## 2023-12-15 MED ORDER — SODIUM CHLORIDE 0.9 % IV SOLN: INTRAVENOUS | Status: DC

## 2023-12-15 MED ORDER — BISACODYL 10 MG RE SUPP: 10.0000 mg | Freq: Every day | RECTAL | Status: DC | PRN

## 2023-12-15 MED ORDER — PHENYLEPHRINE 80 MCG/ML (10ML) SYRINGE FOR IV PUSH (FOR BLOOD PRESSURE SUPPORT)
PREFILLED_SYRINGE | INTRAVENOUS | Status: AC
  Filled 2023-12-15: qty 10

## 2023-12-15 MED ORDER — LIDOCAINE 2% (20 MG/ML) 5 ML SYRINGE
INTRAMUSCULAR | Status: DC | PRN
  Administered 2023-12-15: 100 mg via INTRAVENOUS

## 2023-12-15 MED ORDER — SENNA 8.6 MG PO TABS
2.0000 | ORAL_TABLET | Freq: Every day | ORAL | Status: DC
  Administered 2023-12-15 – 2023-12-17 (×3): 17.2 mg via ORAL
  Filled 2023-12-15 (×3): qty 2

## 2023-12-15 MED ORDER — METOCLOPRAMIDE HCL 5 MG/ML IJ SOLN: 5.0000 mg | Freq: Three times a day (TID) | INTRAMUSCULAR | Status: DC | PRN

## 2023-12-15 MED ORDER — PROPOFOL 10 MG/ML IV BOLUS
INTRAVENOUS | Status: AC
  Filled 2023-12-15: qty 20

## 2023-12-15 MED ORDER — MENTHOL 3 MG MT LOZG: 1.0000 | LOZENGE | OROMUCOSAL | Status: DC | PRN

## 2023-12-15 MED ORDER — BUPIVACAINE-EPINEPHRINE (PF) 0.25% -1:200000 IJ SOLN
INTRAMUSCULAR | Status: AC
  Filled 2023-12-15: qty 30

## 2023-12-15 MED ORDER — PHENYLEPHRINE 80 MCG/ML (10ML) SYRINGE FOR IV PUSH (FOR BLOOD PRESSURE SUPPORT)
PREFILLED_SYRINGE | INTRAVENOUS | Status: DC | PRN
  Administered 2023-12-15 (×2): 160 ug via INTRAVENOUS

## 2023-12-15 MED ORDER — TRANEXAMIC ACID-NACL 1000-0.7 MG/100ML-% IV SOLN
1000.0000 mg | INTRAVENOUS | Status: AC
  Administered 2023-12-15: 1000 mg via INTRAVENOUS
  Filled 2023-12-15: qty 100

## 2023-12-15 MED ORDER — PHENYLEPHRINE HCL-NACL 20-0.9 MG/250ML-% IV SOLN
INTRAVENOUS | Status: DC | PRN
  Administered 2023-12-15: 60 ug/min via INTRAVENOUS

## 2023-12-15 MED ORDER — PROPOFOL 10 MG/ML IV BOLUS
INTRAVENOUS | Status: DC | PRN
  Administered 2023-12-15: 100 mg via INTRAVENOUS

## 2023-12-15 MED ORDER — EPHEDRINE 5 MG/ML INJ
INTRAVENOUS | Status: AC
  Filled 2023-12-15: qty 5

## 2023-12-15 MED ORDER — DEXAMETHASONE SODIUM PHOSPHATE 10 MG/ML IJ SOLN
10.0000 mg | Freq: Once | INTRAMUSCULAR | Status: AC
  Administered 2023-12-16: 10 mg via INTRAVENOUS
  Filled 2023-12-15: qty 1

## 2023-12-15 MED ORDER — DIPHENHYDRAMINE HCL 12.5 MG/5ML PO ELIX: 12.5000 mg | ORAL_SOLUTION | ORAL | Status: DC | PRN

## 2023-12-15 MED ORDER — HYDROMORPHONE HCL 1 MG/ML IJ SOLN: 0.5000 mg | INTRAMUSCULAR | Status: DC | PRN

## 2023-12-15 MED ORDER — CEFAZOLIN SODIUM-DEXTROSE 2-4 GM/100ML-% IV SOLN
2.0000 g | Freq: Four times a day (QID) | INTRAVENOUS | Status: AC
  Administered 2023-12-15 – 2023-12-16 (×2): 2 g via INTRAVENOUS
  Filled 2023-12-15 (×2): qty 100

## 2023-12-15 MED ORDER — PHENOL 1.4 % MT LIQD: 1.0000 | OROMUCOSAL | Status: DC | PRN

## 2023-12-15 MED ORDER — CEFAZOLIN SODIUM-DEXTROSE 2-4 GM/100ML-% IV SOLN
2.0000 g | INTRAVENOUS | Status: AC
  Administered 2023-12-15: 2 g via INTRAVENOUS
  Filled 2023-12-15: qty 100

## 2023-12-15 MED ORDER — POLYETHYLENE GLYCOL 3350 17 G PO PACK
17.0000 g | PACK | Freq: Two times a day (BID) | ORAL | Status: DC
  Administered 2023-12-15 – 2023-12-18 (×6): 17 g via ORAL
  Filled 2023-12-15 (×6): qty 1

## 2023-12-15 MED ORDER — KETOROLAC TROMETHAMINE 30 MG/ML IJ SOLN
INTRAMUSCULAR | Status: AC
  Filled 2023-12-15: qty 1

## 2023-12-15 MED ORDER — POVIDONE-IODINE 10 % EX SWAB: 2.0000 | Freq: Once | CUTANEOUS | Status: DC

## 2023-12-15 MED ORDER — 0.9 % SODIUM CHLORIDE (POUR BTL) OPTIME
TOPICAL | Status: DC | PRN
  Administered 2023-12-15: 1000 mL

## 2023-12-15 MED ORDER — TRAMADOL HCL 50 MG PO TABS
50.0000 mg | ORAL_TABLET | Freq: Four times a day (QID) | ORAL | Status: DC | PRN
  Filled 2023-12-15: qty 1

## 2023-12-15 MED ORDER — LACTATED RINGERS IV SOLN: INTRAVENOUS | Status: DC | PRN

## 2023-12-15 MED ORDER — ONDANSETRON HCL 4 MG/2ML IJ SOLN
INTRAMUSCULAR | Status: AC
  Filled 2023-12-15: qty 2

## 2023-12-15 MED ORDER — FENTANYL CITRATE (PF) 100 MCG/2ML IJ SOLN
INTRAMUSCULAR | Status: AC
  Filled 2023-12-15: qty 2

## 2023-12-15 MED ORDER — SODIUM CHLORIDE (PF) 0.9 % IJ SOLN
INTRAMUSCULAR | Status: AC
  Filled 2023-12-15: qty 30

## 2023-12-15 MED ORDER — ONDANSETRON HCL 4 MG/2ML IJ SOLN
INTRAMUSCULAR | Status: DC | PRN
  Administered 2023-12-15: 4 mg via INTRAVENOUS

## 2023-12-15 MED ORDER — HYDROMORPHONE HCL 2 MG/ML IJ SOLN
INTRAMUSCULAR | Status: AC
  Filled 2023-12-15: qty 1

## 2023-12-15 MED ORDER — OXYCODONE HCL 5 MG PO TABS: 2.5000 mg | ORAL_TABLET | ORAL | Status: DC | PRN

## 2023-12-15 MED ORDER — SODIUM CHLORIDE (PF) 0.9 % IJ SOLN
INTRAMUSCULAR | Status: DC | PRN
  Administered 2023-12-15: 61 mL

## 2023-12-15 MED ORDER — DEXAMETHASONE SODIUM PHOSPHATE 10 MG/ML IJ SOLN
INTRAMUSCULAR | Status: AC
  Filled 2023-12-15: qty 1

## 2023-12-15 MED ORDER — CHLORHEXIDINE GLUCONATE 4 % EX SOLN: 60.0000 mL | Freq: Once | CUTANEOUS | Status: DC

## 2023-12-15 SURGICAL SUPPLY — 36 items
BAG COUNTER SPONGE SURGICOUNT (BAG) IMPLANT
BAG ZIPLOCK 12X15 (MISCELLANEOUS) ×1 IMPLANT
BALL HIP DEPUY 53 (Hips) IMPLANT
BLADE SAG 18X100X1.27 (BLADE) ×1 IMPLANT
COVER PERINEAL POST (MISCELLANEOUS) ×1 IMPLANT
COVER SURGICAL LIGHT HANDLE (MISCELLANEOUS) ×1 IMPLANT
DERMABOND ADVANCED .7 DNX12 (GAUZE/BANDAGES/DRESSINGS) ×1 IMPLANT
DRAPE FOOT SWITCH (DRAPES) ×1 IMPLANT
DRAPE STERI IOBAN 125X83 (DRAPES) ×1 IMPLANT
DRAPE U-SHAPE 47X51 STRL (DRAPES) ×2 IMPLANT
DRESSING AQUACEL AG SP 3.5X10 (GAUZE/BANDAGES/DRESSINGS) ×1 IMPLANT
DRSG AQUACEL AG ADV 3.5X10 (GAUZE/BANDAGES/DRESSINGS) IMPLANT
DURAPREP 26ML APPLICATOR (WOUND CARE) ×1 IMPLANT
ELECT REM PT RETURN 15FT ADLT (MISCELLANEOUS) ×1 IMPLANT
GLOVE BIO SURGEON STRL SZ 6 (GLOVE) ×1 IMPLANT
GLOVE BIOGEL PI IND STRL 6.5 (GLOVE) ×1 IMPLANT
GLOVE BIOGEL PI IND STRL 7.5 (GLOVE) ×1 IMPLANT
GLOVE ORTHO TXT STRL SZ7.5 (GLOVE) ×2 IMPLANT
GOWN STRL REUS W/ TWL LRG LVL3 (GOWN DISPOSABLE) ×2 IMPLANT
HOLDER FOLEY CATH W/STRAP (MISCELLANEOUS) ×1 IMPLANT
KIT TURNOVER KIT A (KITS) ×1 IMPLANT
MANIFOLD NEPTUNE II (INSTRUMENTS) ×1 IMPLANT
NDL SAFETY ECLIPSE 18X1.5 (NEEDLE) ×1 IMPLANT
PACK ANTERIOR HIP CUSTOM (KITS) ×1 IMPLANT
PENCIL SMOKE EVACUATOR (MISCELLANEOUS) ×1 IMPLANT
SPACER FEM TAPERED +0 12/14 (Hips) IMPLANT
STEM FEM ACTIS STD SZ7 (Nail) IMPLANT
SUT MNCRL AB 4-0 PS2 18 (SUTURE) ×1 IMPLANT
SUT VIC AB 1 CT1 36 (SUTURE) ×3 IMPLANT
SUT VIC AB 2-0 CT1 TAPERPNT 27 (SUTURE) ×2 IMPLANT
SUTURE STRATFX 0 PDS 27 VIOLET (SUTURE) ×1 IMPLANT
SYR 3ML LL SCALE MARK (SYRINGE) ×1 IMPLANT
TOWEL GREEN STERILE FF (TOWEL DISPOSABLE) ×1 IMPLANT
TRAY FOLEY MTR SLVR 16FR STAT (SET/KITS/TRAYS/PACK) ×1 IMPLANT
TUBE SUCTION HIGH CAP CLEAR NV (SUCTIONS) ×1 IMPLANT
WATER STERILE IRR 1000ML POUR (IV SOLUTION) ×1 IMPLANT

## 2023-12-15 NOTE — H&P (View-Only) (Signed)
 Patient ID: Travis [name], male   DOB: [birth year], 89 y.o.   MRN: [id number]  Asked to see patient for management his right hip fracture  Radiographs reviewed correlating with history  Right hip femoral neck fracture  NPO for OR today Right hip hemiarthroplasty Consent ordered

## 2023-12-15 NOTE — Progress Notes (Signed)
 OT Cancellation Note  Patient Details Name: Travis [name] MRN: [id number] DOB: [birth year]   Cancelled Treatment:    Reason Eval/Treat Not Completed: Medical issues which prohibited therapy (waiting for hip sx). Will follow up post op.  Warrick POUR OTR/L  Acute Rehab Services  [phone] office number   Warrick Berber 12/15/2023, 6:59 AM

## 2023-12-15 NOTE — Anesthesia Postprocedure Evaluation (Signed)
 Anesthesia Post Note  Patient: YARIEL FERRARIS  Procedure(s) Performed: RIGHT HIP HEMIARTHROPLASTY ANTERIOR APPROACH (Right: Hip)     Patient location during evaluation: PACU Anesthesia Type: General Level of consciousness: awake and alert, oriented and patient cooperative Pain management: pain level controlled Vital Signs Assessment: post-procedure vital signs reviewed and stable Respiratory status: spontaneous breathing, nonlabored ventilation and respiratory function stable Cardiovascular status: blood pressure returned to baseline and stable Postop Assessment: no apparent nausea or vomiting Anesthetic complications: no   No notable events documented.  Last Vitals:  Vitals:   12/15/23 1745 12/15/23 1800  BP: 130/72 130/70  Pulse: 66   Resp: 13 14  Temp:    SpO2: 98%     Last Pain:  Vitals:   12/15/23 1715  TempSrc:   PainSc: Asleep                 Almarie CHRISTELLA Marchi

## 2023-12-15 NOTE — Op Note (Signed)
 NAME:  Travis [name]                ACCOUNT NO.: [id number]      MEDICAL RECORD NO.: [id number]      FACILITY:  [facility]      PHYSICIAN:  Travis JONETTA Car  DATE OF BIRTH:  26-Feb-1932     DATE OF PROCEDURE:  12/15/2023                                 OPERATIVE REPORT         PREOPERATIVE DIAGNOSIS: Right hip displaced femoral neck fracture     POSTOPERATIVE DIAGNOSIS:  Right hip displaced femoral neck fracture      PROCEDURE:  Right hip hemiarthroplasty through an anterior approach   utilizing DePuy THR system with a size 7 standard offset Actis stem with a 53 mm femoral head and a +5 adapter      SURGEON:  Travis JONETTA. Car, M.D.      ASSISTANT:  Rosina Calin, PA-C     ANESTHESIA:  General.      SPECIMENS:  None.      COMPLICATIONS:  None.      BLOOD LOSS:  350 cc     DRAINS:  None.      INDICATION OF THE PROCEDURE:  Travis [name] is a 89 y.o. male with medical history of dementia apparently over the past 6 weeks has been having a problem with his ambulation.  He lives with famil.  They have noticed significant reduction his functional capabilities.  Initial radiographs did not indicate fracture however follow-up radiographs indicated displaced femoral neck fracture.  He was admitted to [facility].  Orthopedics was consulted for management.  I discussed with family the need to proceed with arthroplasty to address his fracture.  Risks include infection DVT dislocation neurovascular injury and the need for future surgeries.  Consent was obtained for the benefit of pain relief and improved and restoration of prior functional level.     PROCEDURE IN DETAIL:  The patient was brought to operative theater.   Once adequate anesthesia, preoperative antibiotics, 2 gm of Ancef , 1 gm of Tranexamic Acid , and 10 mg of Decadron  were administered, the patient was positioned supine on the [employer] table.  Once the patient was safely positioned with adequate  padding of boney prominences we predraped out the hip, and used fluoroscopy to confirm orientation of the pelvis.      The right hip was then prepped and draped from proximal iliac crest to   mid thigh with a shower curtain technique.      Time-out was performed identifying the patient, planned procedure, and the appropriate extremity.     An incision was then made 2 cm lateral to the   anterior superior iliac spine extending over the orientation of the   tensor fascia lata muscle and sharp dissection was carried down to the   fascia of the muscle.      The fascia was then incised.  The muscle belly was identified and swept   laterally and retractor placed along the superior neck.  Following   cauterization of the circumflex vessels and removing some pericapsular   fat, a second cobra retractor was placed on the inferior neck.  A T-capsulotomy was made along the line of the   superior neck to the trochanteric fossa, then extended proximally and  distally.  Tag sutures were placed and the retractors were then placed   intracapsular.  We then identified the trochanteric fossa and   orientation of my neck cut and then made a neck osteotomy with the femur on traction.  The fractured femoral neck segment and femoral  head were removed without difficulty or complication.  Traction was let   off and retractors were placed posterior and anterior around the   acetabulum.   I ultimately ended up performing a capsulectomy to allow for reduction of the hemiarthroplasty ball.    Attention was now turned towards femoral preparation.  At this point, the femur was rolled to 100 degrees.  Further capsule was   released off the inferior aspect of the femoral neck.  I then   released the superior capsule proximally.  With the leg in a neutral position the hook was placed laterally   along the femur under the vastus lateralis origin and elevated manually and then held in position using the hook attachment  on the bed.  The leg was then extended and adducted with the leg rolled to 100   degrees of external rotation.  Retractors were placed along the medial calcar and posteriorly over the greater trochanter.  Once the proximal femur was fully   exposed, I used a box osteotome to set orientation.  I then began   broaching with the starting chili pepper broach and passed this by hand and then broached up to 7.  With the 7 broach in place I chose a standard offset neck and did several trial reductions.  The offset was appropriate, leg lengths   appeared to be equal best matched with the 53 millimeter femoral head with a 0 adapter confirmed radiographically.   Given these findings, I went ahead and dislocated the hip, repositioned all   retractors and positioned the right hip in the extended and abducted position.  The final 7 standard Actis stem was   chosen and it was impacted down to the level of neck cut.  Based on this   and the trial reductions, the final 53 mm unipolar ball was opened with the 0 adapter.  The ball and adapter were configured on the back table and then impacted onto the trunnion and the hip was reduced.  The   hip had been irrigated throughout the case again at this point.  The fascia of the   tensor fascia lata muscle was then reapproximated using #1 Vicryl and #0 Stratafix sutures.  The   remaining wound was closed with 2-0 Vicryl and running 4-0 Monocryl.   The hip was cleaned, dried, and dressed sterilely using Dermabond and   Aquacel dressing.  The patient was then brought   to recovery room in stable condition tolerating the procedure well.   PA Patti was present for the entirety of the case involved from preoperative positioning, perioperative retractor management, general facilitation of the case, as well as primary wound closure as assistant.            Travis [name], M.D.        12/15/2023 3:46 PM

## 2023-12-15 NOTE — TOC Initial Note (Signed)
 Transition of Care [facility]) - Initial/Assessment Note    Patient Details  Name: Travis [name] MRN: [id number] Date of Birth: Sep 06, 1931  Transition of Care [facility]) CM/SW Contact:    Alfonse JONELLE Rex, RN Phone Number: 12/15/2023, 10:09 AM  Clinical Narrative:  Met with patient and patient's daughter, Leala, at bedside to introduce role of TOC/NCM and review for dc planning, pt's daughter answered assessment questions, reports patient resides alone , has a HHA 20 hrs/wk and 24 hr care from family. Dtr states patient has a hx of Dementia and Parkinson's, states he has all the home DME he needs at this time including a hospital bed, BSC, lifts, hoyer lift has been ordered, RW, cane. Surgery planned for today, reviewed patient will be seen by PT post op with recommendations, NCM will review recommendations with family. Dtr states patient was recently on services for [facility] services with [facility] and has received care at SNF in the past. TOC will continue to follow.                  Expected Discharge Plan: Skilled Nursing Facility Barriers to Discharge: Continued Medical Work up   Patient Goals and CMS Choice Patient states their goals for this hospitalization and ongoing recovery are:: return home          Expected Discharge Plan and Services       Living arrangements for the past 2 months: Single Family Home                                      Prior Living Arrangements/Services Living arrangements for the past 2 months: Single Family Home Lives with:: Adult Children Patient language and need for interpreter reviewed:: Yes Do you feel safe going back to the place where you live?: Yes      Need for Family Participation in Patient Care: Yes (Comment) Care giver support system in place?: Yes (comment) Current home services: DME, Homehealth aide (Daughter states patient has all the home DME he needs, including hoyer lift ( ordered by MD), hospital bed, BSC, RW, etc. Has HHA 20  hrs/wk) Criminal Activity/Legal Involvement Pertinent to Current Situation/Hospitalization: No - Comment as needed  Activities of Daily Living   ADL Screening (condition at time of admission) Independently performs ADLs?: No Does the patient have a NEW difficulty with bathing/dressing/toileting/self-feeding that is expected to last >3 days?: No Does the patient have a NEW difficulty with getting in/out of bed, walking, or climbing stairs that is expected to last >3 days?: No Does the patient have a NEW difficulty with communication that is expected to last >3 days?: No Is the patient deaf or have difficulty hearing?: No Does the patient have difficulty seeing, even when wearing glasses/contacts?: No Does the patient have difficulty concentrating, remembering, or making decisions?: Yes  Permission Sought/Granted                  Emotional Assessment Appearance:: Appears stated age Attitude/Demeanor/Rapport: Unable to Assess     Alcohol / Substance Use: Not Applicable Psych Involvement: No (comment)  Admission diagnosis:  Displaced fracture of right femoral neck (HCC) [S72.001A] Closed displaced fracture of right femoral neck (HCC) [S72.001A] Patient Active Problem List   Diagnosis Date Noted   Closed displaced fracture of right femoral neck (HCC) 12/14/2023   CAP (community acquired pneumonia) 12/14/2023   Pneumothorax 08/23/2023   Acute metabolic encephalopathy 08/23/2023  Elevated troponin 08/23/2023   Urinary tract infection 08/23/2023   Gait disturbance 08/23/2023   Fall at home, initial encounter 08/23/2023   Parkinson's disease dementia (HCC) 08/23/2023   Iron deficiency anemia 08/23/2023   Abnormal CT of the chest 08/23/2023   COVID-19 virus infection 10/20/2022   Bladder cancer (HCC) 10/20/2022   Prediabetes 10/31/2019   Sensory polyneuropathy 07/12/2019   Tremor 05/24/2019   Essential tremor 04/29/2016   Primary osteoarthritis of left knee 03/18/2016    Hernia of abdominal cavity    B12 deficiency 10/23/2015   Difficulty walking 10/16/2015   Left knee pain 10/16/2015   MCI (mild cognitive impairment) 10/16/2015   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 [place] [address] Phone: [phone] Fax: [phone]  [facility] #[id number] - [place], [place] - [address] AT [facility] OF [address] [place] [address] Phone: [phone] Fax: [phone]     Social Drivers of Health (SDOH) Social History: SDOH Screenings   Food Insecurity: No Food Insecurity (12/15/2023)  Housing: Low Risk  (12/15/2023)  Transportation Needs: No Transportation Needs (12/15/2023)  Utilities: Not At Risk (12/15/2023)  Financial Resource Strain: Low Risk  (03/13/2023)   Received from [facility]  Social Connections: Socially Isolated (08/23/2023)  Tobacco Use: Low Risk  (12/15/2023)   SDOH Interventions:     Readmission Risk Interventions    12/15/2023   10:07 AM  Readmission Risk Prevention Plan  Transportation Screening Complete  PCP or Specialist Appt within 5-7 Days Complete  Home Care Screening Complete  Medication Review (RN CM) Complete

## 2023-12-15 NOTE — Transfer of Care (Signed)
 Immediate Anesthesia Transfer of Care Note  Patient: Travis [name]  Procedure(s) Performed: RIGHT HIP HEMIARTHROPLASTY ANTERIOR APPROACH (Right: Hip)  Patient Location: PACU  Anesthesia Type:General  Level of Consciousness: sedated and drowsy  Airway & Oxygen Therapy: Patient Spontanous Breathing and Patient connected to face mask oxygen  Post-op Assessment: Report given to RN and Post -op Vital signs reviewed and stable  Post vital signs: Reviewed and stable  Last Vitals:  Vitals Value Taken Time  BP 136/78 12/15/23 17:16  Temp    Pulse 65 12/15/23 17:18  Resp 11 12/15/23 17:18  SpO2 100 % 12/15/23 17:18  Vitals shown include unfiled device data.  Last Pain:  Vitals:   12/15/23 1715  TempSrc:   PainSc: Asleep         Complications: No notable events documented.

## 2023-12-15 NOTE — Progress Notes (Signed)
 PT Cancellation Note  Patient Details Name: Travis [name] MRN: [id number] DOB: April 25, 1932   Cancelled Treatment:    Reason Eval/Treat Not Completed: Patient not medically ready  Patient has a hip fracture and is waiting  fro surgery. Will follow post op. Darice Potters PT Acute Rehabilitation Services Office [phone]  Potters Darice Norris 12/15/2023, 6:55 AM

## 2023-12-15 NOTE — Consult Note (Signed)
 ORTHOPAEDIC CONSULTATION  REQUESTING PHYSICIAN: Vernon Ranks, MD  PCP:  Lenon Layman ORN, MD  Chief Complaint: Right hip pain   HPI: Travis [name] is a 89 y.o. male who complains of right hip pain after a fall at the end of June. Patients daughter and son in law are present. They state that he is ambulatory at home. Patient has been having continued progressing pain and difficulty with mobility/ambulation since the fall. CT pelvis from the [place] confirmed a right femoral neck fracture; patient and family was told to bring the patient into the ED.   Past Medical History:  Diagnosis Date   Arthritis    Cancer (HCC)    BLADDER   Complication of anesthesia    had a hard time recovering from anesthesia after knee replacement neuro symptoms   Dementia (HCC)    Hyperlipidemia    Parkinson disease (HCC)    Past Surgical History:  Procedure Laterality Date   eye implants  1992   EYE SURGERY Bilateral    Cataract Extraction with IOL Implants   HERNIA REPAIR Right 1992   Inguinal Hernia Repair   INGUINAL HERNIA REPAIR Left 01/03/2016   Procedure: LAPAROSCOPIC INGUINAL HERNIA;  Surgeon: Laneta JULIANNA Luna, MD;  Location: [facility] ORS;  Service: General;  Laterality: Left;   INGUINAL HERNIA REPAIR Left 06/01/2019   Procedure: HERNIA REPAIR INGUINAL ADULT;  Surgeon: Rodolph Romano, MD;  Location: [facility] ORS;  Service: General;  Laterality: Left;   JOINT REPLACEMENT     TOTAL KNEE ARTHROPLASTY Left 03/18/2016   Procedure: TOTAL KNEE ARTHROPLASTY;  Surgeon: Ozell Flake, MD;  Location: [facility] ORS;  Service: Orthopedics;  Laterality: Left;   TRANSURETHRAL RESECTION OF BLADDER TUMOR N/A 09/19/2016   Procedure: TRANSURETHRAL RESECTION OF BLADDER TUMOR (TURBT) MEDIUM;  Surgeon: Chauncey Redell Agent, MD;  Location: [facility] ORS;  Service: Urology;  Laterality: N/A;   Social History   Socioeconomic History   Marital status: Widowed    Spouse name: Not on file   Number of children: Not on file    Years of education: Not on file   Highest education level: Not on file  Occupational History   Not on file  Tobacco Use   Smoking status: Never   Smokeless tobacco: Never  Vaping Use   Vaping status: Never Used  Substance and Sexual Activity   Alcohol use: No   Drug use: No   Sexual activity: Not Currently  Other Topics Concern   Not on file  Social History Narrative   Not on file   Social Drivers of Health   Financial Resource Strain: Low Risk  (03/13/2023)   [facility]   Overall Financial Resource Strain (CARDIA)    Difficulty of Paying Living Expenses: Not hard at all  Food Insecurity: No Food Insecurity (12/15/2023)   Hunger Vital Sign    Worried About Running Out of Food in the Last Year: Never true    Ran Out of Food in the Last Year: Never true  Transportation Needs: No Transportation Needs (12/15/2023)   PRAPARE - Administrator, Civil Service (Medical): No    Lack of Transportation (Non-Medical): No  Physical Activity: Not on file  Stress: Not on file  Social Connections: Socially Isolated (08/23/2023)   Social Connection and Isolation Panel    Frequency of Communication with Friends and Family: Twice a week    Frequency of Social Gatherings with Friends and Family: Once a week  Attends Religious Services: Never    Active Member of Clubs or Organizations: No    Attends Banker Meetings: Never    Marital Status: Widowed   Family History  Problem Relation Age of Onset   Stroke Father    Diabetes Sister    Breast cancer Sister    Cancer Maternal Aunt    Hematuria Neg Hx    Renal cancer Neg Hx    Prostate cancer Neg Hx    Bladder Cancer Neg Hx    Allergies  Allergen Reactions   Sinemet  [Carbidopa -Levodopa ] Other (See Comments)    -Ataxia -Hallucinations   Ditropan  [Oxybutynin ] Other (See Comments)    -Hallucinations   Enablex  [Darifenacin ] Other (See Comments)    -Hallucinations   Gabapentin  Other  (See Comments)    -Hallucinations   Myrbetriq  [Mirabegron ] Other (See Comments)    -Hallucinations   Propofol  Other (See Comments)    -Hallucinations   Celebrex [Celecoxib] Other (See Comments)    -excessive salivation   Prior to Admission medications   Medication Sig Start Date End Date Taking? Authorizing Provider  acetaminophen  (TYLENOL ) 325 MG tablet Take 650 mg by mouth every 8 (eight) hours as needed for moderate pain (pain score 4-6). 11/09/23  Yes [provider]  acetaminophen  (TYLENOL ) 650 MG CR tablet Take 1,300 mg by mouth daily as needed for pain.   Yes [provider]  amoxicillin -clavulanate (AUGMENTIN ) 875-125 MG tablet Take 1 tablet by mouth 2 (two) times daily. For pneumonia 12/03/23  Yes [provider]  ascorbic acid  (VITAMIN C ) 500 MG tablet Take 1 tablet (500 mg total) by mouth daily. 10/24/22  Yes Regalado, Belkys A, MD  camphor-menthol  (SARNA) lotion Apply 1 Application topically 2 (two) times daily as needed (arthritis). 08/13/23  Yes [provider]  carboxymethylcellulose 1 % ophthalmic solution Apply 1 drop to eye 4 (four) times daily as needed (dry eye).   Yes [provider]  Carboxymethylcellulose Sod PF 1 % GEL Apply 1 application  to eye in the morning and at bedtime.   Yes [provider]  cetirizine (ZYRTEC) 10 MG tablet Take 10 mg by mouth daily.   Yes [provider]  diclofenac  Sodium (VOLTAREN ) 1 % GEL Apply 2 g topically 4 (four) times daily. 08/27/23  Yes Tobie Yetta HERO, MD  feeding supplement (ENSURE ENLIVE / ENSURE PLUS) LIQD Take 237 mLs by mouth 2 (two) times daily between meals. 08/27/23  Yes Tobie Yetta HERO, MD  finasteride  (PROSCAR ) 5 MG tablet Take 1 tablet (5 mg total) by mouth daily. 04/13/23  Yes Nieves Cough, MD  furosemide (LASIX) 20 MG tablet Take 20 mg by mouth daily as needed for edema. 08/13/23  Yes [provider]  ketotifen (ZADITOR) 0.035 % ophthalmic solution Place  1 drop into both eyes daily as needed (allergies). 12/10/23  Yes [provider]  pantoprazole  (PROTONIX ) 40 MG tablet Take 40 mg by mouth daily. 05/15/21  Yes [provider]  tamsulosin  (FLOMAX ) 0.4 MG CAPS capsule Take 1 capsule (0.4 mg total) by mouth daily after supper. 11/24/22  Yes Nieves Cough, MD  Vibegron  (GEMTESA ) 75 MG TABS Take 1 tablet (75 mg total) by mouth daily. 04/13/23  Yes Nieves Cough, MD  vitamin B-12 (CYANOCOBALAMIN ) 1000 MCG tablet Take 1,000 mcg by mouth daily.    Yes [provider]  VITAMIN D  PO Take 1 tablet by mouth daily.   Yes [provider]  Zinc  Oxide 12 % CREA Apply 1  application  topically daily. 08/23/23  Yes [provider]  azithromycin (ZITHROMAX) 250 MG tablet Take 500 mg by mouth daily. Patient not taking: Reported on 12/14/2023 12/03/23   [provider]   DG Chest Portable 1 View Result Date: 12/14/2023 CLINICAL DATA:  R femoral neck fx on CT scan from 12/02/23; RLL PNA on CT chest noncon EXAM: PORTABLE CHEST - 1 VIEW COMPARISON:  August 25, 2023, August 23, 2023 FINDINGS: Low lung volumes. Skin fold artifact along the left lateral chest. No focal airspace consolidation, pleural effusion, or pneumothorax. Mild cardiomegaly. No acute fracture or destructive lesion. IMPRESSION: Low lung volumes.  Otherwise, no acute cardiopulmonary abnormality. Electronically Signed   By: Rogelia Myers M.D.   On: 12/14/2023 16:56   DG Hip Unilat W or Wo Pelvis 2-3 Views Right Result Date: 12/14/2023 CLINICAL DATA:  R femoral neck fx on CT scan from 12/02/23; RLL PNA on CT chest noncon EXAM: DG HIP (WITH OR WITHOUT PELVIS) 2-3V RIGHT COMPARISON:  None Available. FINDINGS: No evidence of pelvic fracture or diastasis.Superiorly displaced, impacted fracture of the intertrochanteric right femoral neck.There is no evidence of arthropathy or other focal bone abnormality.Soft tissues are unremarkable. IMPRESSION: Superiorly displaced,  impacted fracture of the right intertrochanteric femoral neck. Electronically Signed   By: Rogelia Myers M.D.   On: 12/14/2023 16:53    Positive ROS: All other systems have been reviewed and were otherwise negative with the exception of those mentioned in the HPI and as above.  Physical Exam: General: Alert, no acute distress Cardiovascular: No pedal edema Respiratory: No cyanosis, no use of accessory musculature GI: No organomegaly, abdomen is soft and non-tender Skin: No lesions in the area of chief complaint Neurologic: Sensation intact distally Psychiatric: Patient is competent for consent with normal mood and affect Lymphatic: No axillary or cervical lymphadenopathy  MUSCULOSKELETAL: no bony deformity on inspection of the right hip. No erythema, edema, or ecchymosis. No tenderness to palpation. No pitting edema of the lower extremities. Palpable pulses. Plantar and dorsiflexion are intact. Sensation is intact. Equal strength in the bilateral lower extremities. ROM of the right hip produces some pain.   Assessment: Travis [name] is a pleasant 89 year old male who fell at the end of June. On original imaging no fracture was visualized. [facility] and [facility] ED as CT pelvis showed a right femoral neck fracture. Patient has been having progressing pain and difficulty with ambulation and mobility since the fall.   Plan: -Plan to manage the right femoral neck fracture with surgical intervention  -Remain NPO  -Continue care -Continue pain medical management      Jeoffrey LOISE Sages, PA-C for Dr. Donaciano Sprang, MD Cell [phone])   12/15/2023 9:02 AM

## 2023-12-15 NOTE — Progress Notes (Signed)
 PROGRESS NOTE    Travis [name]  FMW:[id number] DOB: 1931-10-28 DOA: 12/14/2023 PCP: Lenon Layman LELON, MD   Brief Narrative:  Travis [name] is a 89 y.o. male with hx of Parkinsons disease with dementia, recurrent falls, bladder CA s/p TURBT, HTN, Neuropathy, IDA,  BPH, who was referred to ED by [facility] after delayed report of R hip fracture. Reportedly fell in June and had persistent pain in the R hip and difficulty ambulating. Initial imaging per daughter 6/29 at time of fall was negative. Had repeat imaging on 7/23 noted of R hip Fx on the 24th. Daughter had tried getting in touch with [facility] and ultimately on 12/14/2023 they got back to her and referred her to the ED. He has had pain in the R hip and difficulty ambulating.  Assessment & Plan:   Principal Problem:   Closed displaced fracture of right femoral neck (HCC) Active Problems:   CAP (community acquired pneumonia)  R impacted, displaced femoral neck/intertrochanteric fracture due to Recurrent Ground-level fall, POA: -EDP consulted with Orthopedics  [facility] Dr. Burnetta (no consultation note in chart so far), likely plan for OR today per H&P.  Patient is NPO.  Continue current pain management.   Community acquired pneumonia  Dx via CT on 7/23, and started on treatment on the 30th with Augmentin   -- Continue Augmentin  BID, EOT 8/6 AM for 7 day course    Clinical diagnosis osteoporosis -Check vitamin D , start vitamin D3 1000 IU daily -Will need follow-up with PCP for further treatment of osteoporosis   Incidental findings on outpatient imaging: - reports in media tab  Advanced coronary atherosclerosis  Small pericardial effusion  Aortic ectasia 4.4 cm  Severe central canal stenosis at L2-3  Multilevel neuroforaminal narrowing    Parkinsons with dementia: Not on medication due to AE. Cautious + sparing use of antipsychotics for agitation in setting of his parkinsons. Tele sitter ordered. Delirium, fall precautions.  Scheduled melatonin   Bladder CA s/p TURBT: outpatient uro f/u   HTN: Not on antihypertensives at time of admission   Neuropathy: Noted, not on medication   Iron deficiency anemia: Hemoglobin stable.  BPH: Continue home finasteride  and flomax      DVT prophylaxis: SCDs Start: 12/14/23 1945 no clear plans of surgery as of now, will hold Lovenox  for that reason.   Code Status: Do not attempt resuscitation (DNR) PRE-ARREST INTERVENTIONS DESIRED  Family Communication: Daughter present at bedside.  Plan of care discussed with patient in length and he/she verbalized understanding and agreed with it.  Status is: Inpatient Remains inpatient appropriate because: Needs hip replacement surgery   Estimated body mass index is 24.74 kg/m as calculated from the following:   Height as of this encounter: 5' 8 (1.727 m).   Weight as of this encounter: 73.8 kg.    Nutritional Assessment: Body mass index is 24.74 kg/m.SABRA Seen by dietician.  I agree with the assessment and plan as outlined below: Nutrition Status:        . Skin Assessment: I have examined the patient's skin and I agree with the wound assessment as performed by the wound care RN as outlined below:    Consultants:  Orthopedics  Procedures:  As above  Antimicrobials:  Anti-infectives (From admission, onward)    Start     Dose/Rate Route Frequency Ordered Stop   12/14/23 2200  amoxicillin  (AMOXIL ) capsule 500 mg  Status:  Discontinued        500 mg Oral Every 12 hours 12/14/23  1907 12/14/23 1939   12/14/23 2200  amoxicillin -clavulanate (AUGMENTIN ) 875-125 MG per tablet 1 tablet        1 tablet Oral Every 12 hours 12/14/23 1939 12/16/23 2159         Subjective: Patient seen and examined.  Lethargic but oriented.  Daughter at the bedside.  Patient denies any complaint or pain in the hip as long as he remains still.  Objective: Vitals:   12/14/23 1900 12/14/23 2100 12/15/23 0111 12/15/23 0631  BP: 115/60 126/74  (!) 98/55 135/82  Pulse: 63 60 64 63  Resp:  16 16 18   Temp:  98 F (36.7 C) 97.7 F (36.5 C) 98 F (36.7 C)  TempSrc:  Oral Oral Oral  SpO2: 94% 99% 100% 96%  Weight:  73.8 kg    Height:  5' 8 (1.727 m)      Intake/Output Summary (Last 24 hours) at 12/15/2023 0757 Last data filed at 12/15/2023 9261 Gross per 24 hour  Intake 0 ml  Output 550 ml  Net -550 ml   Filed Weights   12/14/23 2100  Weight: 73.8 kg    Examination:  General exam: Appears calm and comfortable  Respiratory system: Clear to auscultation. Respiratory effort normal. Cardiovascular system: S1 & S2 heard, RRR. No JVD, murmurs, rubs, gallops or clicks. No pedal edema. Gastrointestinal system: Abdomen is nondistended, soft and nontender. No organomegaly or masses felt. Normal bowel sounds heard. Central nervous system: Alert and oriented. No focal neurological deficits. Skin: No rashes, lesions or ulcers   Data Reviewed: I have personally reviewed following labs and imaging studies  CBC: Recent Labs  Lab 12/14/23 1517 12/15/23 0328  WBC 5.2 6.2  NEUTROABS 3.2  --   HGB 12.3* 11.9*  HCT 41.7 38.7*  MCV 79.6* 79.3*  PLT 304 268   Basic Metabolic Panel: Recent Labs  Lab 12/14/23 1517 12/15/23 0328  NA 138 136  K 3.9 3.7  CL 98 98  CO2 30 27  GLUCOSE 103* 111*  BUN 15 17  CREATININE 0.72 0.52*  CALCIUM 9.7 9.6  MG  --  1.7  PHOS  --  3.4   GFR: Estimated Creatinine Clearance: 59.4 mL/min (A) (by C-G formula based on SCr of 0.52 mg/dL (L)). Liver Function Tests: No results for input(s): AST, ALT, ALKPHOS, BILITOT, PROT, ALBUMIN  in the last 168 hours. No results for input(s): LIPASE, AMYLASE in the last 168 hours. No results for input(s): AMMONIA in the last 168 hours. Coagulation Profile: Recent Labs  Lab 12/15/23 0328  INR 1.1   Cardiac Enzymes: No results for input(s): CKTOTAL, CKMB, CKMBINDEX, TROPONINI in the last 168 hours. BNP (last 3 results) No  results for input(s): PROBNP in the last 8760 hours. HbA1C: No results for input(s): HGBA1C in the last 72 hours. CBG: No results for input(s): GLUCAP in the last 168 hours. Lipid Profile: No results for input(s): CHOL, HDL, LDLCALC, TRIG, CHOLHDL, LDLDIRECT in the last 72 hours. Thyroid Function Tests: No results for input(s): TSH, T4TOTAL, FREET4, T3FREE, THYROIDAB in the last 72 hours. Anemia Panel: No results for input(s): VITAMINB12, FOLATE, FERRITIN, TIBC, IRON, RETICCTPCT in the last 72 hours. Sepsis Labs: No results for input(s): PROCALCITON, LATICACIDVEN in the last 168 hours.  Recent Results (from the past 240 hours)  Resp panel by RT-PCR (RSV, Flu A&B, Covid) Anterior Nasal Swab     Status: None   Collection Time: 12/14/23  4:01 PM   Specimen: Anterior Nasal Swab  Result Value Ref Range  Status   SARS Coronavirus 2 by RT PCR NEGATIVE NEGATIVE Final    Comment: (NOTE) SARS-CoV-2 target nucleic acids are NOT DETECTED.  The SARS-CoV-2 RNA is generally detectable in upper respiratory specimens during the acute phase of infection. The lowest concentration of SARS-CoV-2 viral copies this assay can detect is 138 copies/mL. A negative result does not preclude SARS-Cov-2 infection and should not be used as the sole basis for treatment or other patient management decisions. A negative result may occur with  improper specimen collection/handling, submission of specimen other than nasopharyngeal swab, presence of viral mutation(s) within the areas targeted by this assay, and inadequate number of viral copies(<138 copies/mL). A negative result must be combined with clinical observations, patient history, and epidemiological information. The expected result is Negative.  Fact Sheet for Patients:  BloggerCourse.com  Fact Sheet for Healthcare Providers:  SeriousBroker.it  This test is no t  yet approved or cleared by the [place]  FDA and  has been authorized for detection and/or diagnosis of SARS-CoV-2 by FDA under an Emergency Use Authorization (EUA). This EUA will remain  in effect (meaning this test can be used) for the duration of the COVID-19 declaration under Section 564(b)(1) of the Act, 21 U.S.C.section 360bbb-3(b)(1), unless the authorization is terminated  or revoked sooner.       Influenza A by PCR NEGATIVE NEGATIVE Final   Influenza B by PCR NEGATIVE NEGATIVE Final    Comment: (NOTE) The Xpert Xpress SARS-CoV-2/FLU/RSV plus assay is intended as an aid in the diagnosis of influenza from Nasopharyngeal swab specimens and should not be used as a sole basis for treatment. Nasal washings and aspirates are unacceptable for Xpert Xpress SARS-CoV-2/FLU/RSV testing.  Fact Sheet for Patients: BloggerCourse.com  Fact Sheet for Healthcare Providers: SeriousBroker.it  This test is not yet approved or cleared by the [place]  FDA and has been authorized for detection and/or diagnosis of SARS-CoV-2 by FDA under an Emergency Use Authorization (EUA). This EUA will remain in effect (meaning this test can be used) for the duration of the COVID-19 declaration under Section 564(b)(1) of the Act, 21 U.S.C. section 360bbb-3(b)(1), unless the authorization is terminated or revoked.     Resp Syncytial Virus by PCR NEGATIVE NEGATIVE Final    Comment: (NOTE) Fact Sheet for Patients: BloggerCourse.com  Fact Sheet for Healthcare Providers: SeriousBroker.it  This test is not yet approved or cleared by the [place]  FDA and has been authorized for detection and/or diagnosis of SARS-CoV-2 by FDA under an Emergency Use Authorization (EUA). This EUA will remain in effect (meaning this test can be used) for the duration of the COVID-19 declaration under Section 564(b)(1)  of the Act, 21 U.S.C. section 360bbb-3(b)(1), unless the authorization is terminated or revoked.  Performed at [facility], [address], [place], [place] [address]   Surgical pcr screen     Status: None   Collection Time: 12/15/23  1:36 AM   Specimen: Nasal Mucosa; Nasal Swab  Result Value Ref Range Status   MRSA, PCR NEGATIVE NEGATIVE Final   Staphylococcus aureus NEGATIVE NEGATIVE Final    Comment: (NOTE) The Xpert SA Assay (FDA approved for NASAL specimens in patients 15 years of age and older), is one component of a comprehensive surveillance program. It is not intended to diagnose infection nor to guide or monitor treatment. Performed at [facility], [address] [place], [place] [address]      Radiology Studies: DG Chest Portable 1 View Result Date: 12/14/2023  CLINICAL DATA:  R femoral neck fx on CT scan from 12/02/23; RLL PNA on CT chest noncon EXAM: PORTABLE CHEST - 1 VIEW COMPARISON:  August 25, 2023, August 23, 2023 FINDINGS: Low lung volumes. Skin fold artifact along the left lateral chest. No focal airspace consolidation, pleural effusion, or pneumothorax. Mild cardiomegaly. No acute fracture or destructive lesion. IMPRESSION: Low lung volumes.  Otherwise, no acute cardiopulmonary abnormality. Electronically Signed   By: Rogelia Myers M.D.   On: 12/14/2023 16:56   DG Hip Unilat W or Wo Pelvis 2-3 Views Right Result Date: 12/14/2023 CLINICAL DATA:  R femoral neck fx on CT scan from 12/02/23; RLL PNA on CT chest noncon EXAM: DG HIP (WITH OR WITHOUT PELVIS) 2-3V RIGHT COMPARISON:  None Available. FINDINGS: No evidence of pelvic fracture or diastasis.Superiorly displaced, impacted fracture of the intertrochanteric right femoral neck.There is no evidence of arthropathy or other focal bone abnormality.Soft tissues are unremarkable. IMPRESSION: Superiorly displaced, impacted fracture of the right intertrochanteric femoral neck. Electronically  Signed   By: Rogelia Myers M.D.   On: 12/14/2023 16:53    Scheduled Meds:  amoxicillin -clavulanate  1 tablet Oral Q12H   cholecalciferol   1,000 Units Oral Daily   [START ON 12/16/2023] feeding supplement  237 mL Oral BID BM   finasteride   5 mg Oral Daily   melatonin  6 mg Oral QHS   pantoprazole   40 mg Oral Daily   sodium chloride  flush  3 mL Intravenous Q12H   tamsulosin   0.4 mg Oral QPC supper   Continuous Infusions:   LOS: 1 day   Fredia Skeeter, MD [facility]  12/15/2023, 7:57 AM   *Please note that this is a verbal dictation therefore any spelling or grammatical errors are due to the Dragon Medical One system interpretation.  Please page via Amion and do not message via secure chat for urgent patient care matters. Secure chat can be used for non urgent patient care matters.  How to contact the [facility] Attending or Consulting provider 7A - 7P or covering provider during after hours 7P -7A, for this patient?  Check the care team in [facility] and look for a) attending/consulting TR[facility] provider listed and b) the [facility] team listed. Page or secure chat 7A-7P. Log into [email or web address] and use [facility] universal password to access. If you do not have the password, please contact the hospital operator. Locate the [facility] provider you are looking for under [facility] and page to a number that you can be directly reached. If you still have difficulty reaching the provider, please page the [facility] (Director on Call) for the Hospitalists listed on amion for assistance.

## 2023-12-15 NOTE — Plan of Care (Signed)

## 2023-12-15 NOTE — Interval H&P Note (Signed)
 History and Physical Interval Note:  12/15/2023 2:33 PM  Travis [name]  has presented today for surgery, with the diagnosis of RIGHT FEMORAL NECK FRACTURE.  The various methods of treatment have been discussed with the patient and family. After consideration of risks, benefits and other options for treatment, the patient has consented to  Procedure(s) with comments: ARTHROPLASTY, HIP, TOTAL, ANTERIOR APPROACH (Right) - HEMIARTHROPLASTY as a surgical intervention.  The patient's history has been reviewed, patient examined, no change in status, stable for surgery.  I have reviewed the patient's chart and labs.  Questions were answered to the patient's satisfaction.     [name]

## 2023-12-15 NOTE — Anesthesia Preprocedure Evaluation (Addendum)
 Anesthesia Evaluation  Patient identified by MRN, date of birth, ID band Patient confused    Reviewed: Allergy & Precautions, NPO status , Patient's Chart, lab work & pertinent test results  History of Anesthesia Complications (+) PROLONGED EMERGENCE and history of anesthetic complications  Airway Mallampati: II  TM Distance: >3 FB Neck ROM: Full    Dental no notable dental hx. (+) Poor Dentition, Dental Advisory Given   Pulmonary neg pulmonary ROS, neg sleep apnea, neg COPD   breath sounds clear to auscultation- rhonchi (-) wheezing      Cardiovascular hypertension, Pt. on medications (-) CAD, (-) Past MI, (-) Cardiac Stents and (-) CABG  Rhythm:Regular Rate:Normal - Systolic murmurs and - Diastolic murmurs    Neuro/Psych neg Seizures PSYCHIATRIC DISORDERS     Dementia negative neurological ROS     GI/Hepatic negative GI ROS, Neg liver ROS,,,  Endo/Other  negative endocrine ROSneg diabetes    Renal/GU negative Renal ROS     Musculoskeletal  (+) Arthritis ,    Abdominal  (+) - obese  Peds  Hematology  (+) Blood dyscrasia, anemia   Anesthesia Other Findings Past Medical History: No date: Arthritis No date: Cancer (HCC)     Comment:  BLADDER No date: Complication of anesthesia     Comment:  had a hard time recovering from anesthesia after knee               replacement neuro symptoms No date: Hyperlipidemia No date: Hypertension   Reproductive/Obstetrics                              Anesthesia Physical Anesthesia Plan  ASA: 4 and emergent  Anesthesia Plan: General   Post-op Pain Management: Ofirmev  IV (intra-op)*   Induction: Intravenous  PONV Risk Score and Plan: 1 and Ondansetron   Airway Management Planned: Oral ETT  Additional Equipment: None  Intra-op Plan:   Post-operative Plan: Extubation in OR  Informed Consent: I have reviewed the patients History and Physical,  chart, labs and discussed the procedure including the risks, benefits and alternatives for the proposed anesthesia with the patient or authorized representative who has indicated his/her understanding and acceptance.    Discussed DNR with power of attorney and Continue DNR.   Dental advisory given  Plan Discussed with: CRNA and Anesthesiologist  Anesthesia Plan Comments: (MODIFIED DNR......discussed with POA and granddaughter.  They are good with cardiac drugs, ventilation but NO CHEST COMPRESSIONS. HPI:  Travis [name] is a 89 y.o. male with hx of Parkinsons disease with dementia, recurrent falls, bladder CA s/p TURBT, HTN, Neuropathy, IDA,  BPH, who was referred to ED by [name] after delayed report of R hip fracture. Reportedly fell in June and had persistent pain in the R hip and difficulty ambulating. Initial imaging per daughter 6/29 at time of fall was negative. Had repeat imaging on 7/23 and notified of R hip Fx on the 24th. Daughter had tried getting in touch with [facility] and ultimately today they got back to her and referred her to the ED. He has had pain in the R hip and difficulty ambulating. Otherwise recent cough nonproductive, no SOB, fever. Denies any hx of chest pain.     )         Anesthesia Quick Evaluation

## 2023-12-15 NOTE — Plan of Care (Signed)

## 2023-12-15 NOTE — Progress Notes (Signed)
 Patient ID: Travis [name], male   DOB: [birth year], 89 y.o.   MRN: [id number]  Asked to see patient for management his right hip fracture  Radiographs reviewed correlating with history  Right hip femoral neck fracture  NPO for OR today Right hip hemiarthroplasty Consent ordered

## 2023-12-15 NOTE — Discharge Instructions (Signed)

## 2023-12-15 NOTE — Anesthesia Procedure Notes (Signed)
 Procedure Name: LMA Insertion Date/Time: 12/15/2023 3:43 PM  Performed by: [place], Corean BROCKS, CRNAPre-anesthesia Checklist: Patient identified, Emergency Drugs available, Suction available and Patient being monitored Patient Re-evaluated:Patient Re-evaluated prior to induction Oxygen Delivery Method: Circle system utilized Preoxygenation: Pre-oxygenation with 100% oxygen Induction Type: IV induction Ventilation: Mask ventilation without difficulty LMA: LMA inserted LMA Size: 5.0 Number of attempts: 1 Airway Equipment and Method: Bite block Placement Confirmation: positive ETCO2 Tube secured with: Tape Dental Injury: Teeth and Oropharynx as per pre-operative assessment

## 2023-12-16 ENCOUNTER — Encounter (HOSPITAL_COMMUNITY): Payer: Self-pay | Admitting: Orthopedic Surgery

## 2023-12-16 DIAGNOSIS — S72001A Fracture of unspecified part of neck of right femur, initial encounter for closed fracture: Secondary | ICD-10-CM | POA: Diagnosis not present

## 2023-12-16 LAB — BASIC METABOLIC PANEL WITH GFR
Anion gap: 13 (ref 5–15)
BUN: 22 mg/dL (ref 8–23)
CO2: 28 mmol/L (ref 22–32)
Calcium: 9.6 mg/dL (ref 8.9–10.3)
Chloride: 97 mmol/L — ABNORMAL LOW (ref 98–111)
Creatinine, Ser: 0.77 mg/dL (ref 0.61–1.24)
GFR, Estimated: >60 mL/min (ref >60)
Glucose, Bld: 123 mg/dL — ABNORMAL HIGH (ref 70–99)
Potassium: 4.6 mmol/L (ref 3.5–5.1)
Sodium: 138 mmol/L (ref 135–145)

## 2023-12-16 LAB — CBC
HCT: 36.2 % — ABNORMAL LOW (ref 39.0–52.0)
Hemoglobin: 10.9 g/dL — ABNORMAL LOW (ref 13.0–17.0)
MCH: 24.3 pg — ABNORMAL LOW (ref 26.0–34.0)
MCHC: 30.1 g/dL (ref 30.0–36.0)
MCV: 80.8 fL (ref 80.0–100.0)
Platelets: 255 K/uL (ref 150–400)
RBC: 4.48 MIL/uL (ref 4.22–5.81)
RDW: 16.7 % — ABNORMAL HIGH (ref 11.5–15.5)
WBC: 9.8 K/uL (ref 4.0–10.5)
nRBC: 0 % (ref 0.0–0.2)

## 2023-12-16 MED ORDER — IBUPROFEN 400 MG PO TABS: 400.0000 mg | ORAL_TABLET | Freq: Four times a day (QID) | ORAL | Status: DC | PRN

## 2023-12-16 MED ORDER — TRAMADOL HCL 50 MG PO TABS
25.0000 mg | ORAL_TABLET | Freq: Four times a day (QID) | ORAL | Status: DC | PRN
  Administered 2023-12-16: 25 mg via ORAL
  Filled 2023-12-16 (×3): qty 1

## 2023-12-16 NOTE — TOC Progression Note (Addendum)
 Transition of Care [facility]) - Progression Note    Patient Details  Name: Travis [name] MRN: [id number] Date of Birth: 1931/11/17  [facility] CM/SW Contact  Alfonse JONELLE Rex, RN Phone Number: 12/16/2023, 1:54 PM  Clinical Narrative:  PT recommendation for [facility] PT per family request. Call to patient's daughter, Travis [name], agreeable with [facility], prefers [facility]. Travis [name] confirmed [employer] [facility] to [facility]. NCM will follow up with [facility] for status of order.    -2:28pm Call to [facility] at [phone], to confirm status of [name], transferred to [employer], vm left for [place] with NCM name and phone number requesting a call back.   -4:2pm [facility], repGLENWOOD [name], accepted referral for [facility] PT/OT, added to AVS.     Expected Discharge Plan: Skilled Nursing Facility Barriers to Discharge: Continued Medical Work up               Expected Discharge Plan and Services       Living arrangements for the past 2 months: Single Family Home                                       Social Drivers of Health (SDOH) Interventions SDOH Screenings   Food Insecurity: No Food Insecurity (12/15/2023)  Housing: Low Risk  (12/15/2023)  Transportation Needs: No Transportation Needs (12/15/2023)  Utilities: Not At Risk (12/15/2023)  Financial Resource Strain: Low Risk  (03/13/2023)   Received from [facility]  Social Connections: Socially Isolated (08/23/2023)  Tobacco Use: Low Risk  (12/15/2023)    Readmission Risk Interventions    12/15/2023   10:07 AM  Readmission Risk Prevention Plan  Transportation Screening Complete  PCP or Specialist Appt within 5-7 Days Complete  Home Care Screening Complete  Medication Review (RN CM) Complete

## 2023-12-16 NOTE — Plan of Care (Signed)
   Problem: Coping: Goal: Level of anxiety will decrease Outcome: Progressing   Problem: Pain Managment: Goal: General experience of comfort will improve and/or be controlled Outcome: Progressing   Problem: Safety: Goal: Ability to remain free from injury will improve Outcome: Progressing

## 2023-12-16 NOTE — Progress Notes (Signed)
 Physical Therapy Treatment Patient Details Name: Travis [name] MRN: [id number] DOB: 06-Aug-1931 Today's Date: 12/16/2023   History of Present Illness Pt is 89 yo male admitted 12/14/23 with impacted, displaced R femoral neck fx.  He is s/p anterior R hemiarthroplasty on 12/15/23.  Pt initial fall in June but imaging negative.  He had repeat imaging on 12/02/23 that revealed R hip fx, daughter tried to contact [facility] and ultimately referred to ED on 8/4. Pt with hx including but not limited to Parkinsons, dementia, falls, bladder CA s/p TURBT, HTN, neuropathy, IDA, BPH.    PT Comments  Similar presentation to this morning requiring mod-max A x 2 for all transfers with use of STEDY.  Family present with continued plan to return home with HHPT- family able to provide assist and have all DME.    If plan is discharge home, recommend the following: Two people to help with walking and/or transfers;Two people to help with bathing/dressing/bathroom   Can travel by private vehicle     No  Equipment Recommendations  Deitra lift (option for [employer] rental? they have one ordered but unsure when will arrive; [facility] transport)    Recommendations for Other Services       Precautions / Restrictions Precautions Precautions: Fall Restrictions Weight Bearing Restrictions Per Provider Order: Yes RLE Weight Bearing Per Provider Order: Weight bearing as tolerated     Mobility  Bed Mobility Overal bed mobility: Needs Assistance Bed Mobility: Sit to Supine     Supine to sit: Max assist, +2 for physical assistance, HOB elevated Sit to supine: Mod assist, +2 for physical assistance   General bed mobility comments: increased time and cues    Transfers Overall transfer level: Needs assistance Equipment used: Ambulation equipment used Transfers: Sit to/from Stand, Bed to chair/wheelchair/BSC Sit to Stand: Mod assist, +2 physical assistance           General transfer comment: STS into STEDY with  mod A x 2 and increased time.  STEDY for transfer to recliner.  Pt wtih difficulty coming to full stand with hips flexed and R knee flexed    Ambulation/Gait               General Gait Details: not able   Stairs             Wheelchair Mobility     Tilt Bed    Modified Rankin (Stroke Patients Only)       Balance Overall balance assessment: Needs assistance, History of Falls Sitting-balance support: Bilateral upper extremity supported Sitting balance-Leahy Scale: Poor Sitting balance - Comments: requirign UE support   Standing balance support: Bilateral upper extremity supported Standing balance-Leahy Scale: Poor Standing balance comment: UE support and mod x 2                            Communication Communication Communication: Impaired Factors Affecting Communication: Reduced clarity of speech  Cognition Arousal: Lethargic Behavior During Therapy: Flat affect   PT - Cognitive impairments: History of cognitive impairments, Difficult to assess                       PT - Cognition Comments: Lethargic and reduced clarity of speech/soft spoken Following commands: Intact Following commands impaired: Only follows one step commands consistently    Cueing Cueing Techniques: Verbal cues, Gestural cues, Tactile cues, Visual cues  Exercises      General Comments General  comments (skin integrity, edema, etc.): Pt with c/o butt stinging.  Daughter reports he is wanting washed up - RN present and aware.  Positioned pt with prevalon boots, slight L sidelying, and pillow under knees for comfort and offloading      Pertinent Vitals/Pain Pain Assessment Pain Assessment: Faces Faces Pain Scale: Hurts little more Pain Location: R hip with movement; at rest sleeping Pain Descriptors / Indicators: Discomfort, Grimacing Pain Intervention(s): Limited activity within patient's tolerance, Monitored during session, Repositioned (declined pain meds)     Home Living Family/patient expects to be discharged to:: Private residence Living Arrangements: Children Available Help at Discharge: Family;Available 24 hours/day Type of Home: House Home Access: Ramped entrance       Home Layout: One level Home Equipment: Tub bench;Wheelchair - manual;Lift chair;Transport Equities trader (2 wheels);BSC/3in1;Grab bars - tub/shower;Hand held shower head Travis [name]; ordering hoyer lift; lift chair; sit - stand (electric), parkinson's rollator made for Parkinson's (laser light and metronome); floor disc for transfers, SCDs) Additional Comments: 20 hours consult of aide care through [place] is suppose to start 4/16 at 1pm, [place] suppose to deliver a hospital bed with air mattress    Prior Function            PT Goals (current goals can now be found in the care plan section) Acute Rehab PT Goals Patient Stated Goal: return home per family -do not want SNF PT Goal Formulation: With patient/family Time For Goal Achievement: 12/30/23 Potential to Achieve Goals: Fair Progress towards PT goals: Progressing toward goals    Frequency    Min 3X/week      PT Plan      Co-evaluation PT/OT/SLP Co-Evaluation/Treatment: Yes Reason for Co-Treatment: Complexity of the patient's impairments (multi-system involvement);For patient/therapist safety PT goals addressed during session: Mobility/safety with mobility;Balance OT goals addressed during session: ADL's and self-care      AM-PAC PT 6 Clicks Mobility   Outcome Measure  Help needed turning from your back to your side while in a flat bed without using bedrails?: Total Help needed moving from lying on your back to sitting on the side of a flat bed without using bedrails?: Total Help needed moving to and from a bed to a chair (including a wheelchair)?: Total Help needed standing up from a chair using your arms (e.g., wheelchair or bedside chair)?: Total Help needed to walk in hospital  room?: Total Help needed climbing 3-5 steps with a railing? : Total 6 Click Score: 6    End of Session Equipment Utilized During Treatment: Gait belt Activity Tolerance: Patient limited by lethargy;Patient limited by pain Patient left: with call bell/phone within reach;with family/visitor present;in bed;with bed alarm set Nurse Communication: Mobility status;Need for lift equipment PT Visit Diagnosis: Other abnormalities of gait and mobility (R26.89);Muscle weakness (generalized) (M62.81)     Time: 8397-8382 PT Time Calculation (min) (ACUTE ONLY): 15 min  Charges:    $Therapeutic Activity: 8-22 mins PT General Charges $$ ACUTE PT VISIT: 1 Visit                     Benjiman, PT Acute Rehab [facility] [phone]    Benjiman VEAR Mulberry 12/16/2023, 4:35 PM

## 2023-12-16 NOTE — Progress Notes (Signed)
   Subjective: 1 Day Post-Op Procedure(s) (LRB): RIGHT HIP HEMIARTHROPLASTY ANTERIOR APPROACH (Right) Patient with baseline cognitive changes. Alert this AM. Follows directions. Appears comfortable Patient seen in rounds with Dr. Ernie. Patient is resting in bed on exam this morning. Daughter sleeping at the bedside and did not wake up with us  talking so we left her to sleep. No acute events overnight.  We will start therapy today.   Objective: Vital signs in last 24 hours: Temp:  [97.3 F (36.3 C)-99.1 F (37.3 C)] 98.8 F (37.1 C) (08/06 0458) Pulse Rate:  [59-74] 74 (08/06 0458) Resp:  [11-18] 18 (08/06 0458) BP: (97-133)/(63-78) 105/75 (08/06 0458) SpO2:  [95 %-100 %] 95 % (08/06 0458)  Intake/Output from previous day:  Intake/Output Summary (Last 24 hours) at 12/16/2023 0733 Last data filed at 12/16/2023 0600 Gross per 24 hour  Intake 1407.36 ml  Output 1580 ml  Net -172.64 ml     Intake/Output this shift: No intake/output data recorded.  Labs: Recent Labs    12/14/23 1517 12/15/23 0328 12/16/23 0343  HGB 12.3* 11.9* 10.9*   Recent Labs    12/15/23 0328 12/16/23 0343  WBC 6.2 9.8  RBC 4.88 4.48  HCT 38.7* 36.2*  PLT 268 255   Recent Labs    12/15/23 0328 12/16/23 0343  NA 136 138  K 3.7 4.6  CL 98 97*  CO2 27 28  BUN 17 22  CREATININE 0.52* 0.77  GLUCOSE 111* 123*  CALCIUM 9.6 9.6   Recent Labs    12/15/23 0328  INR 1.1    Exam: General - Patient is Alert Extremity - Neurologically intact Sensation intact distally Intact pulses distally Dorsiflexion/Plantar flexion intact Dressing - dressing C/D/I Motor Function - intact, moving foot and toes well on exam.   Past Medical History:  Diagnosis Date   Arthritis    Cancer (HCC)    BLADDER   Complication of anesthesia    had a hard time recovering from anesthesia after knee replacement neuro symptoms   Dementia (HCC)    Hyperlipidemia    Parkinson disease (HCC)      Assessment/Plan: 1 Day Post-Op Procedure(s) (LRB): RIGHT HIP HEMIARTHROPLASTY ANTERIOR APPROACH (Right) Principal Problem:   Closed displaced fracture of right femoral neck (HCC) Active Problems:   CAP (community acquired pneumonia)   S/P total right hip arthroplasty  Estimated body mass index is 24.74 kg/m as calculated from the following:   Height as of this encounter: 5' 8 (1.727 m).   Weight as of this encounter: 73.8 kg. Advance diet Up with therapy  DVT Prophylaxis - Aspirin  81 mg BID x4 weeks Weight bearing as tolerated. No precautions  Hgb stable at 10.9 this AM  Up with PT today as able Plans for d/c home with family  Will determine need for HHPT, but typically not necessary He is very mobile at baseline despite his Parkinsons  Rosina Calin, PA-C Orthopedic Surgery [phone] 12/16/2023, 7:33 AM

## 2023-12-16 NOTE — Evaluation (Signed)
 Occupational Therapy Evaluation Patient Details Name: Travis [name] MRN: [id number] DOB: 1931-09-17 Today's Date: 12/16/2023   History of Present Illness   Pt is 89 yo male admitted 12/14/23 with impacted, displaced R femoral neck fx.  He is s/p anterior R hemiarthroplasty on 12/15/23.  Pt initial fall in June but imaging negative.  He had repeat imaging on 12/02/23 that revealed R hip fx, daughter tried to contact [facility] and ultimately referred to ED on 8/4. Pt with hx including but not limited to Parkinsons, dementia, falls, bladder CA s/p TURBT, HTN, neuropathy, IDA, BPH.     Clinical Impressions PTA pt lives at home with family who assist with mobility and ADL tasks. PLOF given by daughter who states that her father was walking household distances and assisting with ADL tasks @ rollator level until his recent fall. Recently pt's brother/family have been lifting him to transfer him bed<> chair only, and family has been assisting with all ADL tasks with the exception of feeding. Plan is to DC home with 24/7 assistance of family. Currently pt overall total A with ADL tasks and max a + 2 for mobility. Family present during session and states they will be able to manage her father at his current level.  Pt will need a hoyer and non-emergent medical ambulance transport home. Acute OT to follow.      If plan is discharge home, recommend the following:   Two people to help with walking and/or transfers;Two people to help with bathing/dressing/bathroom;Direct supervision/assist for medications management;Direct supervision/assist for financial management;Assist for transportation;Help with stairs or ramp for entrance;Assistance with cooking/housework;Assistance with feeding     Functional Status Assessment   Patient has had a recent decline in their functional status and demonstrates the ability to make significant improvements in function in a reasonable and predictable amount of time.      Equipment Recommendations   Audiological scientist for [employer]         Precautions/Restrictions   Precautions Precautions: Fall Restrictions [employer] Bearing Restrictions Per Provider Order: Yes RLE Weight Bearing Per Provider Order: Weight bearing as tolerated     Mobility Bed Mobility Overal bed mobility: Needs Assistance Bed Mobility: Supine to Sit     Supine to sit: Max assist, +2 for physical assistance, HOB elevated     General bed mobility comments: increased time and cues    Transfers Overall transfer level: Needs assistance Equipment used: Ambulation equipment used Transfers: Sit to/from Stand, Bed to chair/wheelchair/BSC Sit to Stand: Mod assist, +2 physical assistance           General transfer comment: STS into STEDY with mod A x 2, bed elevated, cues , and increased time.  STEDY for transfer to recliner.  Pt wtih difficulty coming to full stand with hips flexed and R knee flexed      Balance Overall balance assessment: Needs assistance, History of Falls   Sitting balance-Leahy Scale: Poor Sitting balance - Comments: initial posterior bias     Standing balance-Leahy Scale: Poor                             ADL either performed or assessed with clinical judgement   ADL Overall ADL's : Needs assistance/impaired Eating/Feeding: Total assistance Eating/Feeding Details (indicate cue type and reason): lethargic- unable to get hands to mouth duet o lethargy and generalized weakness; uses AE at times for self feeding Grooming: Maximal  assistance   Upper Body Bathing: Total assistance   Lower Body Bathing: Total assistance   Upper Body Dressing : Total assistance   Lower Body Dressing: Total assistance   Toilet Transfer: +2 for physical assistance;Moderate assistance   Toileting- Clothing Manipulation and Hygiene: Total assistance       Functional mobility during ADLs: Maximal assistance;+2 for physical  assistance       Vision Baseline Vision/History: 1 Wears glasses Additional Comments: eyes closed 50% of session; wears glasses with prisms however daughter states he will be fine without them. Asked daughter to bring glasses in if needed.     Perception         Praxis         Pertinent Vitals/Pain Pain Assessment Pain Assessment: Faces Faces Pain Scale: Hurts even more Pain Location: R hip with movement; at rest sleeping Pain Descriptors / Indicators: Discomfort, Grimacing Pain Intervention(s): Limited activity within patient's tolerance     Extremity/Trunk Assessment Upper Extremity Assessment Upper Extremity Assessment: Generalized weakness (unable to touch hand to mouth; hx of  B shoulder problems; gross grasp/release; poor in-hand manipulation)   Lower Extremity Assessment Lower Extremity Assessment: Defer to PT evaluation RLE Deficits / Details: Somewhat difficult to assess due to lethargy and difficulty following complex commands.  ROM appeared [facility]; MMT: weak throughout, <3/5 strength bil hips and knees; reports signficant R knee OA LLE Deficits / Details: Somewhat difficult to assess due to lethargy and difficulty following complex commands. ROM appeared [facility]; MMT: weak throughout, <3/5 strength bil hips and knees; reports signficant R knee OA   Cervical / Trunk Assessment Cervical / Trunk Assessment: Kyphotic   Communication Communication Communication: Impaired Factors Affecting Communication: Reduced clarity of speech   Cognition Arousal: Lethargic Behavior During Therapy: Flat affect Cognition: History of cognitive impairments, Cognition impaired   Orientation impairments: Time Awareness: Intellectual awareness impaired, Online awareness impaired Memory impairment (select all impairments): Short-term memory, Working [employer] fast streamer, Engineer, structural memory Attention impairment (select first level of impairment): Sustained attention Executive functioning  impairment (select all impairments): Initiation, Organization, Sequencing, Reasoning, Problem solving OT - Cognition Comments: more impaired most likley associated with pain meds and post op confusion                 Following commands: Impaired Following commands impaired: Only follows one step commands consistently     Cueing  General Comments   Cueing Techniques: Verbal cues;Gestural cues;Tactile cues;Visual cues      Exercises Exercises: Other exercises Other Exercises Other Exercises: encouraged family to use incentive spirometer; pt completed 3 times pulling @ Other Exercises: encouraged family to complete general BUE AROM   Shoulder Instructions      Home Living Family/patient expects to be discharged to:: Private residence Living Arrangements: Children Available Help at Discharge: Family;Available 24 hours/day Type of Home: House Home Access: Ramped entrance     Home Layout: One level     Bathroom Shower/Tub: Tub/shower unit;Walk-in shower;Curtain   Bathroom Toilet: Handicapped height Bathroom Accessibility: Yes How Accessible: Accessible via walker;Accessible via wheelchair Home Equipment: Tub bench;Wheelchair - manual;Lift chair;Transport Equities trader (2 wheels);BSC/3in1;Grab bars - tub/shower;Hand held shower head [name]; ordering hoyer lift; lift chair; sit - stand (electric), parkinson's rollator made for Parkinson's (laser light and metronome); floor disc for transfers, SCDs)   Additional Comments: 20 hours consult of aide care through [place] is suppose to start 4/16 at 1pm, [place] suppose to deliver a hospital bed with air mattress  Prior Functioning/Environment Prior Level of Function : Needs assist       Physical Assist : Mobility (physical);ADLs (physical) (before he fell, required a little assistance with rollator with big wheels; since fall in June pt's brother/ family has been physically transferring him) Mobility  (physical): Bed mobility;Transfers;Gait ADLs (physical): Bathing;Dressing;Toileting (family assisted with ADL prior to fall; pt did as much as he could; used weighted utensils; more assistance) Mobility Comments: Prior to fall: household ambulator using RW, requires supervision/min A at baseline ADLs Comments: requires (A) for all adls except self feeding    OT Problem List: Decreased strength;Decreased range of motion;Decreased activity tolerance;Impaired balance (sitting and/or standing);Decreased coordination;Decreased cognition;Decreased safety awareness;Decreased knowledge of use of DME or AE;Impaired UE functional use;Pain   OT Treatment/Interventions: Self-care/ADL training;Therapeutic exercise;DME and/or AE instruction;Therapeutic activities;Balance training;Patient/family education      OT Goals(Current goals can be found in the care plan section)   Acute Rehab OT Goals Patient Stated Goal: return home wiht HH services OT Goal Formulation: With patient/family Time For Goal Achievement: 12/30/23 Potential to Achieve Goals: Good   OT Frequency:  Min 2X/week    Co-evaluation PT/OT/SLP Co-Evaluation/Treatment: Yes Reason for Co-Treatment: Complexity of the patient's impairments (multi-system involvement);For patient/therapist safety PT goals addressed during session: Mobility/safety with mobility;Balance OT goals addressed during session: ADL's and self-care      AM-PAC OT 6 Clicks Daily Activity     Outcome Measure Help from another person eating meals?: Total Help from another person taking care of personal grooming?: A Lot Help from another person toileting, which includes using toliet, bedpan, or urinal?: Total Help from another person bathing (including washing, rinsing, drying)?: Total Help from another person to put on and taking off regular upper body clothing?: Total Help from another person to put on and taking off regular lower body clothing?: Total 6 Click  Score: 7   End of Session Equipment Utilized During Treatment: Gait belt;Other (comment) Laurent) Nurse Communication: Mobility status;Need for lift equipment;Other (comment) Laurent; May need [employer])  Activity Tolerance: Patient tolerated treatment well Patient left: in chair;with call bell/phone within reach;with chair alarm set;with family/visitor present  OT Visit Diagnosis: Unsteadiness on feet (R26.81);Other abnormalities of gait and mobility (R26.89);Repeated falls (R29.6);Muscle weakness (generalized) (M62.81);Low vision, both eyes (H54.2);Other symptoms and signs involving cognitive function;Pain Pain - Right/Left: Right Pain - part of body: Hip                Time: 8947-8870 OT Time Calculation (min): 37 min Charges:  OT General Charges $OT Visit: 1 Visit OT Evaluation $OT Eval Moderate Complexity: 1 Mod  Aiana Nordquist, OT/L   Acute OT Clinical Specialist Acute Rehabilitation Services Pager [phone] Office [phone]   [facility] 12/16/2023, 2:02 PM

## 2023-12-16 NOTE — Evaluation (Signed)
 Physical Therapy Evaluation Patient Details Name: Travis [name] MRN: [id number] DOB: 05-31-1931 Today's Date: 12/16/2023  History of Present Illness  Pt is 89 yo male admitted 12/14/23 with impacted, displaced R femoral neck fx.  He is s/p anterior R hemiarthroplasty on 12/15/23.  Pt initial fall in June but imaging negative.  He had repeat imaging on 12/02/23 that revealed R hip fx, daughter tried to contact [facility] and ultimately referred to ED on 8/4. Pt with hx including but not limited to Parkinsons, dementia, falls, bladder CA s/p TURBT, HTN, neuropathy, IDA, BPH.  Clinical Impression  Pt admitted with above diagnosis. At baseline, pt was ambulatory with AD and close supervision prior to fall.  Since his fall in June has need Max A for transfers and pivots.  Family has purchased abundance of DME including hospital bed, lift chair, sara stedy lift and hoyer ordered.  They are able to provide 24 hr care and do not want SNF at d/c.  Today, pt lethargic but able to be aroused with increased time and effort.  He required max x 2 to EOB and mod x 2 to stand with STEDY.  Family present and report comfortable and have already been providing that level of care. Would recommend continue HHPT/OT.  Pt currently with functional limitations due to the deficits listed below (see PT Problem List). Pt will benefit from acute skilled PT to increase their independence and safety with mobility to allow discharge.           If plan is discharge home, recommend the following: Two people to help with walking and/or transfers;Two people to help with bathing/dressing/bathroom   Can travel by private vehicle   No    Equipment Recommendations Deitra lift (option for [employer] rental?  they have one ordered but unsure when will arrive; [facility] transport)  Recommendations for Other Services       Functional Status Assessment Patient has had a recent decline in their functional status and demonstrates the ability to  make significant improvements in function in a reasonable and predictable amount of time.     Precautions / Restrictions Precautions Precautions: Fall Restrictions Weight Bearing Restrictions Per Provider Order: Yes RLE Weight Bearing Per Provider Order: Weight bearing as tolerated      Mobility  Bed Mobility Overal bed mobility: Needs Assistance Bed Mobility: Supine to Sit     Supine to sit: Max assist, +2 for physical assistance, HOB elevated     General bed mobility comments: increased time and cues    Transfers Overall transfer level: Needs assistance Equipment used: Ambulation equipment used Transfers: Sit to/from Stand, Bed to chair/wheelchair/BSC Sit to Stand: Mod assist, +2 physical assistance           General transfer comment: STS into STEDY with mod A x 2, bed elevated, cues , and increased time.  STEDY for transfer to recliner.  Pt wtih difficulty coming to full stand with hips flexed and R knee flexed    Ambulation/Gait               General Gait Details: not able  Stairs            Wheelchair Mobility     Tilt Bed    Modified Rankin (Stroke Patients Only)       Balance  Pertinent Vitals/Pain Pain Assessment Pain Assessment: Faces Faces Pain Scale: Hurts even more Pain Location: R hip with movement; at rest sleeping Pain Descriptors / Indicators: Discomfort Pain Intervention(s): Limited activity within patient's tolerance, Monitored during session, Repositioned, Other (comment) (Robaxin  at 810; family declined other due to tends to make pt lethargic)    Home Living Family/patient expects to be discharged to:: Private residence Living Arrangements: Children Available Help at Discharge: Family;Available 24 hours/day Type of Home: House Home Access: Ramped entrance       Home Layout: One level Home Equipment: Tub bench;Wheelchair - manual;Lift chair;Transport  Equities trader (2 wheels);BSC/3in1;Grab bars - tub/shower;Hand held shower head Travis [name]; ordering hoyer lift; lift chair; sit - stand (electric), parkinson's rollator made for Parkinson's (laser light and metronome); floor disc for transfers, SCDs)      Prior Function Prior Level of Function : Needs assist       Physical Assist : Mobility (physical);ADLs (physical) (before he fell, required a little assistance with rollator with big wheels; since fall in June pt's brother/ family has been physically transferring him) Mobility (physical): Bed mobility;Transfers;Gait ADLs (physical): Bathing;Dressing;Toileting (family assisted with ADL prior to fall; pt did as much as he could; used weighted utensils; more assistance) Mobility Comments: Prior to fall: household ambulator using RW, requires supervision/min A at baseline ADLs Comments: requires (A) for all adls except self feeding     Extremity/Trunk Assessment   Upper Extremity Assessment Upper Extremity Assessment: Defer to OT evaluation    Lower Extremity Assessment Lower Extremity Assessment: LLE deficits/detail;RLE deficits/detail;Difficult to assess due to impaired cognition RLE Deficits / Details: Somewhat difficult to assess due to lethargy and difficulty following complex commands.  ROM appeared [facility]; MMT: weak throughout, <3/5 strength bil hips and knees; reports signficant R knee OA LLE Deficits / Details: Somewhat difficult to assess due to lethargy and difficulty following complex commands. ROM appeared [facility]; MMT: weak throughout, <3/5 strength bil hips and knees; reports signficant R knee OA    Cervical / Trunk Assessment Cervical / Trunk Assessment: Kyphotic  Communication   Communication Communication: Impaired Factors Affecting Communication: Reduced clarity of speech    Cognition Arousal: Lethargic Behavior During Therapy: Flat affect   PT - Cognitive impairments: History of cognitive  impairments, Difficult to assess                       PT - Cognition Comments: Pt very lethargic and difficult to arouse initially - required HOB elevated, lights on, cool rag, tactile and verbal cues.  Some improvement at EOB. Following commands: Intact       Cueing Cueing Techniques: Verbal cues     General Comments      Exercises     Assessment/Plan    PT Assessment Patient needs continued PT services  PT Problem List Decreased strength;Decreased mobility;Decreased range of motion;Decreased activity tolerance;Decreased balance;Decreased cognition;Decreased knowledge of use of DME;Pain       PT Treatment Interventions DME instruction;Therapeutic exercise;Gait training;Cognitive remediation;Functional mobility training;Therapeutic activities;Patient/family education;Balance training    PT Goals (Current goals can be found in the Care Plan section)  Acute Rehab PT Goals Patient Stated Goal: return home per family -do not want SNF PT Goal Formulation: With patient/family Time For Goal Achievement: 12/30/23 Potential to Achieve Goals: Fair    Frequency Min 3X/week     Co-evaluation PT/OT/SLP Co-Evaluation/Treatment: Yes Reason for Co-Treatment: Complexity of the patient's impairments (multi-system involvement);For patient/therapist safety PT goals addressed during session: Mobility/safety  with mobility;Balance         AM-PAC PT 6 Clicks Mobility  Outcome Measure Help needed turning from your back to your side while in a flat bed without using bedrails?: Total Help needed moving from lying on your back to sitting on the side of a flat bed without using bedrails?: Total Help needed moving to and from a bed to a chair (including a wheelchair)?: Total Help needed standing up from a chair using your arms (e.g., wheelchair or bedside chair)?: Total Help needed to walk in hospital room?: Total Help needed climbing 3-5 steps with a railing? : Total 6 Click Score:  6    End of Session Equipment Utilized During Treatment: Gait belt Activity Tolerance: Patient limited by lethargy;Patient limited by pain Patient left: with chair alarm set;in chair;with call bell/phone within reach;with family/visitor present Nurse Communication: Mobility status;Need for lift equipment PT Visit Diagnosis: Other abnormalities of gait and mobility (R26.89);Muscle weakness (generalized) (M62.81)    Time: 8947-8868 PT Time Calculation (min) (ACUTE ONLY): 39 min   Charges:   PT Evaluation $PT Eval Moderate Complexity: 1 Mod PT Treatments $Therapeutic Activity: 8-22 mins PT General Charges $$ ACUTE PT VISIT: 1 Visit         Travis, PT Acute Rehab [facility] [phone]   Travis [name] 12/16/2023, 1:38 PM

## 2023-12-16 NOTE — Progress Notes (Signed)
 PROGRESS NOTE    Travis [name]  FMW:[id number] DOB: 13-Jan-1932 DOA: 12/14/2023 PCP: Lenon Layman LELON, MD   Brief Narrative: 89 year old with past medical history significant for Parkinson disease, dementia, recurrent falls, bladder cancer status post TURBT, hypertension, neuropathy, IDA, BPH who was referred to the ED by [facility] after delayed report of right hip fracture.  Reportedly fell in June and had persistent pain in the right hip and difficulty ambulating.  Initial imaging per daughter 6/29 at that time of the fall was negative.  Had repeat imaging 7/23 noted to have right hip fracture.  Daughter had tried getting in touch with [facility] to evaluate on 12/14/2023 they got back to her and referred her her to the ED for evaluation.     Assessment & Plan:   Principal Problem:   Closed displaced fracture of right femoral neck (HCC) Active Problems:   CAP (community acquired pneumonia)   S/P total right hip arthroplasty  1-R impacted, displaced femoral neck/intertrochanteric fracture due to Recurrent Ground-level fall, POA: - Patient was found to have right hip displaced femoral neck fracture.  Underwent right hip hemiarthroplasty through anterior approach by Dr. Ernie 8/5 -Continue as scheduled Tylenol  - Baby aspirin  twice daily for DVT prophylaxis - Will add ibuprofen  as needed, on low-dose oxycodone  as needed - Home health recommended for patient.  Community acquired pneumonia  Dx via CT on 7/23, and started on treatment on the 30th with Augmentin   -- Patient will complete Augmentin  8/6   Clinical diagnosis osteoporosis - Continue vitamin D  supplement  Incidental findings on outpatient imaging: - reports in media tab  Advanced coronary atherosclerosis  Small pericardial effusion  Aortic ectasia 4.4 cm  Severe central canal stenosis at L2-3  Multilevel neuroforaminal narrowing    Parkinsons with dementia: Not on medication due to AE.  As needed melatonin for  insomnia  Bladder CA s/p TURBT: outpatient uro f/u    HTN: Not on antihypertensives at time of admission    Neuropathy: Noted, not on medication    Iron deficiency anemia: Hemoglobin stable. Continue to monitor hemoglobin, acute blood loss postsurgery expected of no clinical significance  BPH: Continue home finasteride  and flomax        Estimated body mass index is 24.74 kg/m as calculated from the following:   Height as of this encounter: 5' 8 (1.727 m).   Weight as of this encounter: 73.8 kg.   DVT prophylaxis: On aspirin  Code Status: DNR with intervention Family Communication: Family is at bedside Disposition Plan:  Status is: Inpatient Remains inpatient appropriate because: Management post op    Consultants:  Ortho  Procedures:  Underwent right hip hemiarthroplasty through anterior approach by Dr. Ernie 8/5  Antimicrobials:    Subjective: He is sleepy, keep eyes closed, but will say 1 or 2 words.  Per family he has been sleeping more at home.  He keeps his eyes closed.  He was able to eat breakfast today.   Objective: Vitals:   12/15/23 1800 12/15/23 2100 12/16/23 0223 12/16/23 0458  BP: 130/70 103/68 97/63 105/75  Pulse: 66 71 66 74  Resp: 14 15 18 18   Temp: 97.8 F (36.6 C) 98 F (36.7 C) 99.1 F (37.3 C) 98.8 F (37.1 C)  TempSrc:  Oral Oral Oral  SpO2: 99% 96% 99% 95%  Weight:      Height:        Intake/Output Summary (Last 24 hours) at 12/16/2023 1045 Last data filed at 12/16/2023 1000 Gross per 24 hour  Intake 1847.22 ml  Output 1430 ml  Net 417.22 ml   Filed Weights   12/14/23 2100  Weight: 73.8 kg    Examination:  General exam: Appears calm and comfortable  Respiratory system: Clear to auscultation. Respiratory effort normal. Cardiovascular system: S1 & S2 heard, RRR. SABRA Gastrointestinal system: Abdomen is nondistended, soft and nontender. Central nervous system: sleepy  Extremities: right hip with dressing  Data Reviewed: I have  personally reviewed following labs and imaging studies  CBC: Recent Labs  Lab 12/14/23 1517 12/15/23 0328 12/16/23 0343  WBC 5.2 6.2 9.8  NEUTROABS 3.2  --   --   HGB 12.3* 11.9* 10.9*  HCT 41.7 38.7* 36.2*  MCV 79.6* 79.3* 80.8  PLT 304 268 255   Basic Metabolic Panel: Recent Labs  Lab 12/14/23 1517 12/15/23 0328 12/16/23 0343  NA 138 136 138  K 3.9 3.7 4.6  CL 98 98 97*  CO2 30 27 28   GLUCOSE 103* 111* 123*  BUN 15 17 22   CREATININE 0.72 0.52* 0.77  CALCIUM 9.7 9.6 9.6  MG  --  1.7  --   PHOS  --  3.4  --    GFR: Estimated Creatinine Clearance: 59.4 mL/min (by C-G formula based on SCr of 0.77 mg/dL). Liver Function Tests: No results for input(s): AST, ALT, ALKPHOS, BILITOT, PROT, ALBUMIN  in the last 168 hours. No results for input(s): LIPASE, AMYLASE in the last 168 hours. No results for input(s): AMMONIA in the last 168 hours. Coagulation Profile: Recent Labs  Lab 12/15/23 0328  INR 1.1   Cardiac Enzymes: No results for input(s): CKTOTAL, CKMB, CKMBINDEX, TROPONINI in the last 168 hours. BNP (last 3 results) No results for input(s): PROBNP in the last 8760 hours. HbA1C: No results for input(s): HGBA1C in the last 72 hours. CBG: No results for input(s): GLUCAP in the last 168 hours. Lipid Profile: No results for input(s): CHOL, HDL, LDLCALC, TRIG, CHOLHDL, LDLDIRECT in the last 72 hours. Thyroid Function Tests: No results for input(s): TSH, T4TOTAL, FREET4, T3FREE, THYROIDAB in the last 72 hours. Anemia Panel: No results for input(s): VITAMINB12, FOLATE, FERRITIN, TIBC, IRON, RETICCTPCT in the last 72 hours. Sepsis Labs: No results for input(s): PROCALCITON, LATICACIDVEN in the last 168 hours.  Recent Results (from the past 240 hours)  Resp panel by RT-PCR (RSV, Flu A&B, Covid) Anterior Nasal Swab     Status: None   Collection Time: 12/14/23  4:01 PM   Specimen: Anterior Nasal Swab   Result Value Ref Range Status   SARS Coronavirus 2 by RT PCR NEGATIVE NEGATIVE Final    Comment: (NOTE) SARS-CoV-2 target nucleic acids are NOT DETECTED.  The SARS-CoV-2 RNA is generally detectable in upper respiratory specimens during the acute phase of infection. The lowest concentration of SARS-CoV-2 viral copies this assay can detect is 138 copies/mL. A negative result does not preclude SARS-Cov-2 infection and should not be used as the sole basis for treatment or other patient management decisions. A negative result may occur with  improper specimen collection/handling, submission of specimen other than nasopharyngeal swab, presence of viral mutation(s) within the areas targeted by this assay, and inadequate number of viral copies(<138 copies/mL). A negative result must be combined with clinical observations, patient history, and epidemiological information. The expected result is Negative.  Fact Sheet for Patients:  BloggerCourse.com  Fact Sheet for Healthcare Providers:  SeriousBroker.it  This test is no t yet approved or cleared by the [place]  FDA and  has been authorized for  detection and/or diagnosis of SARS-CoV-2 by FDA under an Emergency Use Authorization (EUA). This EUA will remain  in effect (meaning this test can be used) for the duration of the COVID-19 declaration under Section 564(b)(1) of the Act, 21 U.S.C.section 360bbb-3(b)(1), unless the authorization is terminated  or revoked sooner.       Influenza A by PCR NEGATIVE NEGATIVE Final   Influenza B by PCR NEGATIVE NEGATIVE Final    Comment: (NOTE) The Xpert Xpress SARS-CoV-2/FLU/RSV plus assay is intended as an aid in the diagnosis of influenza from Nasopharyngeal swab specimens and should not be used as a sole basis for treatment. Nasal washings and aspirates are unacceptable for Xpert Xpress SARS-CoV-2/FLU/RSV testing.  Fact Sheet for  Patients: BloggerCourse.com  Fact Sheet for Healthcare Providers: SeriousBroker.it  This test is not yet approved or cleared by the [place]  FDA and has been authorized for detection and/or diagnosis of SARS-CoV-2 by FDA under an Emergency Use Authorization (EUA). This EUA will remain in effect (meaning this test can be used) for the duration of the COVID-19 declaration under Section 564(b)(1) of the Act, 21 U.S.C. section 360bbb-3(b)(1), unless the authorization is terminated or revoked.     Resp Syncytial Virus by PCR NEGATIVE NEGATIVE Final    Comment: (NOTE) Fact Sheet for Patients: BloggerCourse.com  Fact Sheet for Healthcare Providers: SeriousBroker.it  This test is not yet approved or cleared by the [place]  FDA and has been authorized for detection and/or diagnosis of SARS-CoV-2 by FDA under an Emergency Use Authorization (EUA). This EUA will remain in effect (meaning this test can be used) for the duration of the COVID-19 declaration under Section 564(b)(1) of the Act, 21 U.S.C. section 360bbb-3(b)(1), unless the authorization is terminated or revoked.  Performed at [facility], [address] [place], [place] [address]   Surgical pcr screen     Status: None   Collection Time: 12/15/23  1:36 AM   Specimen: Nasal Mucosa; Nasal Swab  Result Value Ref Range Status   MRSA, PCR NEGATIVE NEGATIVE Final   Staphylococcus aureus NEGATIVE NEGATIVE Final    Comment: (NOTE) The Xpert SA Assay (FDA approved for NASAL specimens in patients 33 years of age and older), is one component of a comprehensive surveillance program. It is not intended to diagnose infection nor to guide or monitor treatment. Performed at [facility], [address], [place], [place] [address]          Radiology Studies: DG Pelvis Portable Result  Date: 12/15/2023 CLINICAL DATA:  Status post hip arthroplasty. EXAM: PORTABLE PELVIS 1-2 VIEWS COMPARISON:  Preoperative imaging FINDINGS: Right hip hemiarthroplasty in expected alignment. No periprosthetic lucency or fracture. Recent postsurgical change includes air and edema in the soft tissues. IMPRESSION: Right hip hemiarthroplasty without immediate postoperative complication. Electronically Signed   By: Andrea Gasman M.D.   On: 12/15/2023 17:47   DG HIP UNILAT WITH PELVIS 1V RIGHT Result Date: 12/15/2023 CLINICAL DATA:  Elective surgery. EXAM: DG HIP (WITH OR WITHOUT PELVIS) 1V RIGHT COMPARISON:  Preoperative imaging FINDINGS: Two fluoroscopic spot views of the pelvis and right hip obtained in the operating room. Images during hip arthroplasty. Fluoroscopy time 4.9 seconds. Dose 0.454 mGy. IMPRESSION: Intraoperative fluoroscopy during right hip arthroplasty. Electronically Signed   By: Andrea Gasman M.D.   On: 12/15/2023 17:47   DG C-Arm 1-60 Min-No Report Result Date: 12/15/2023 Fluoroscopy was utilized by the requesting physician.  No radiographic interpretation.   DG C-Arm 1-60 Min-No Report  Result Date: 12/15/2023 Fluoroscopy was utilized by the requesting physician.  No radiographic interpretation.   DG Chest Portable 1 View Result Date: 12/14/2023 CLINICAL DATA:  R femoral neck fx on CT scan from 12/02/23; RLL PNA on CT chest noncon EXAM: PORTABLE CHEST - 1 VIEW COMPARISON:  August 25, 2023, August 23, 2023 FINDINGS: Low lung volumes. Skin fold artifact along the left lateral chest. No focal airspace consolidation, pleural effusion, or pneumothorax. Mild cardiomegaly. No acute fracture or destructive lesion. IMPRESSION: Low lung volumes.  Otherwise, no acute cardiopulmonary abnormality. Electronically Signed   By: Rogelia Myers M.D.   On: 12/14/2023 16:56   DG Hip Unilat W or Wo Pelvis 2-3 Views Right Result Date: 12/14/2023 CLINICAL DATA:  R femoral neck fx on CT scan from 12/02/23; RLL PNA  on CT chest noncon EXAM: DG HIP (WITH OR WITHOUT PELVIS) 2-3V RIGHT COMPARISON:  None Available. FINDINGS: No evidence of pelvic fracture or diastasis.Superiorly displaced, impacted fracture of the intertrochanteric right femoral neck.There is no evidence of arthropathy or other focal bone abnormality.Soft tissues are unremarkable. IMPRESSION: Superiorly displaced, impacted fracture of the right intertrochanteric femoral neck. Electronically Signed   By: Rogelia Myers M.D.   On: 12/14/2023 16:53        Scheduled Meds:  acetaminophen   1,000 mg Oral Q6H   aspirin   81 mg Oral BID   cholecalciferol   1,000 Units Oral Daily   feeding supplement  237 mL Oral BID BM   finasteride   5 mg Oral Daily   melatonin  6 mg Oral QHS   pantoprazole   40 mg Oral Daily   polyethylene glycol  17 g Oral BID   senna  2 tablet Oral QHS   sodium chloride  flush  3 mL Intravenous Q12H   tamsulosin   0.4 mg Oral QPC supper   Continuous Infusions:  sodium chloride  75 mL/hr at 12/16/23 0822     LOS: 2 days    Time spent: 35 minutes    Fusae Florio A Cortana Vanderford, MD [facility]   If 7PM-7AM, please contact night-coverage [email or web address]  12/16/2023, 10:45 AM

## 2023-12-17 DIAGNOSIS — S72001A Fracture of unspecified part of neck of right femur, initial encounter for closed fracture: Secondary | ICD-10-CM | POA: Diagnosis not present

## 2023-12-17 LAB — CBC
HCT: 30.5 % — ABNORMAL LOW (ref 39.0–52.0)
Hemoglobin: 9.5 g/dL — ABNORMAL LOW (ref 13.0–17.0)
MCH: 24.7 pg — ABNORMAL LOW (ref 26.0–34.0)
MCHC: 31.1 g/dL (ref 30.0–36.0)
MCV: 79.4 fL — ABNORMAL LOW (ref 80.0–100.0)
Platelets: 190 K/uL (ref 150–400)
RBC: 3.84 MIL/uL — ABNORMAL LOW (ref 4.22–5.81)
RDW: 16.5 % — ABNORMAL HIGH (ref 11.5–15.5)
WBC: 9.2 K/uL (ref 4.0–10.5)
nRBC: 0 % (ref 0.0–0.2)

## 2023-12-17 LAB — VITAMIN B12: Vitamin B-12: 392 pg/mL (ref 180–914)

## 2023-12-17 LAB — TSH: TSH: 0.288 u[IU]/mL — ABNORMAL LOW (ref 0.350–4.500)

## 2023-12-17 LAB — T4, FREE: Free T4: 1.13 ng/dL — ABNORMAL HIGH (ref 0.61–1.12)

## 2023-12-17 MED ORDER — FOLIC ACID 1 MG PO TABS
1.0000 mg | ORAL_TABLET | Freq: Every day | ORAL | Status: DC
  Administered 2023-12-17 – 2023-12-18 (×2): 1 mg via ORAL
  Filled 2023-12-17 (×2): qty 1

## 2023-12-17 MED ORDER — VITAMIN B-12 1000 MCG PO TABS
500.0000 ug | ORAL_TABLET | Freq: Every day | ORAL | Status: DC
  Administered 2023-12-17 – 2023-12-18 (×2): 500 ug via ORAL
  Filled 2023-12-17 (×2): qty 1

## 2023-12-17 MED ORDER — DICLOFENAC SODIUM 1 % EX GEL
2.0000 g | Freq: Four times a day (QID) | CUTANEOUS | Status: DC
  Administered 2023-12-17 – 2023-12-18 (×4): 2 g via TOPICAL
  Filled 2023-12-17: qty 100

## 2023-12-17 NOTE — Progress Notes (Signed)
 Physical Therapy Treatment Patient Details Name: Travis [name] MRN: [id number] DOB: [birth year] Today's Date: 12/17/2023   History of Present Illness Pt is 89 yo male admitted 12/14/23 with impacted, displaced R femoral neck fx.  He is s/p anterior R hemiarthroplasty on 12/15/23.  Pt initial fall in June but imaging negative.  He had repeat imaging on 12/02/23 that revealed R hip fx, daughter tried to contact [facility] and ultimately referred to ED on 8/4. Pt with hx including but not limited to Parkinsons, dementia, falls, bladder CA s/p TURBT, HTN, neuropathy, IDA, BPH.    PT Comments  Patient awakens when spoken to . Dtr and other family present.  Patient requires total assistance  to move to sitting onto bed edge, moving legs and lifting trunk to upright.  Max assist  of 2 to power up to stand in [facility] lift, much effort to stand erect to place flaps under buttocks, transfer to recliner and  max of 3 to stand erect to get flaps removed  and assist to sitting down to recliner. Patient indicating knee pain. Dtr reports using a topical rub on right knee PTA. RN notified to try to get the rub. Patient left in recliner.  Plans are for  patient to return home with 24/7/caregivers. Recommend HHPT   If plan is discharge home, recommend the following: Two people to help with walking and/or transfers;Two people to help with bathing/dressing/bathroom   Can travel by private vehicle     No  Equipment Recommendations  None recommended by PT    Recommendations for Other Services       Precautions / Restrictions Precautions Precautions: Fall Restrictions RLE Weight Bearing Per Provider Order: Weight bearing as tolerated     Mobility  Bed Mobility Overal bed mobility: Needs Assistance Bed Mobility: Supine to Sit     Supine to sit: Total assist, +2 for physical assistance, +2 for safety/equipment     General bed mobility comments: patient tends to tense up when starting to move patient  in bed. Moved legs and  trunk to sitting onto bed edge, patiernt offers no assistance.    Transfers Overall transfer level: Needs assistance Equipment used: Ambulation equipment used   Sit to Stand: +2 physical assistance, Max assist, +2 safety/equipment, From elevated surface, Via lift equipment           General transfer comment: STS into STEDY with max A x 2 and increased time.  STEDY for transfer to recliner.  Pt wtih difficulty coming to full stand with hips flexed and R knee flexed Transfer via Lift Equipment: Stedy  Ambulation/Gait                   Stairs             Wheelchair Mobility     Tilt Bed    Modified Rankin (Stroke Patients Only)       Balance Overall balance assessment: Needs assistance, History of Falls Sitting-balance support: Bilateral upper extremity supported Sitting balance-Leahy Scale: Poor Sitting balance - Comments: requiring UE support   Standing balance support: Bilateral upper extremity supported Standing balance-Leahy Scale: Poor Standing balance comment: mod once up to stand, holding onto STEDY bar                            Communication Communication Communication: Impaired Factors Affecting Communication: Reduced clarity of speech  Cognition Arousal: Lethargic Behavior During Therapy: Flat affect  PT - Cognitive impairments: History of cognitive impairments, Difficult to assess Difficult to assess due to: Impaired communication                     PT - Cognition Comments: aroused  with stimulation, difficult to understand speech Following commands: Impaired Following commands impaired: Follows one step commands inconsistently    Cueing Cueing Techniques: Verbal cues, Gestural cues, Tactile cues, Visual cues  Exercises General Exercises - Lower Extremity Ankle Circles/Pumps: AROM, AAROM, Both, 10 reps, Supine Heel Slides: AAROM, Both, 10 reps, Supine Hip ABduction/ADduction: AAROM,  Both, 10 reps, Supine    General Comments        Pertinent Vitals/Pain Pain Assessment Faces Pain Scale: Hurts little more Pain Location: right knee and  hip Pain Descriptors / Indicators: Discomfort, Grimacing Pain Intervention(s): Monitored during session, Repositioned, Ice applied    Home Living                          Prior Function            PT Goals (current goals can now be found in the care plan section) Progress towards PT goals: Progressing toward goals    Frequency    Min 3X/week      PT Plan      Co-evaluation              AM-PAC PT 6 Clicks Mobility   Outcome Measure  Help needed turning from your back to your side while in a flat bed without using bedrails?: Total Help needed moving from lying on your back to sitting on the side of a flat bed without using bedrails?: Total Help needed moving to and from a bed to a chair (including a wheelchair)?: Total Help needed standing up from a chair using your arms (e.g., wheelchair or bedside chair)?: Total Help needed to walk in hospital room?: Total Help needed climbing 3-5 steps with a railing? : Total 6 Click Score: 6    End of Session Equipment Utilized During Treatment: Gait belt Activity Tolerance: Patient limited by lethargy;Patient limited by pain Patient left: in chair;with call bell/phone within reach;with chair alarm set;with family/visitor present Nurse Communication: Mobility status;Need for lift equipment PT Visit Diagnosis: Other abnormalities of gait and mobility (R26.89);Muscle weakness (generalized) (M62.81)     Time: 8859-8792 PT Time Calculation (min) (ACUTE ONLY): 27 min  Charges:    $Therapeutic Activity: 23-37 mins PT General Charges $$ ACUTE PT VISIT: 1 Visit                    Darice Potters PT Acute Rehabilitation Services Office [phone]    Potters Darice Norris 12/17/2023, 2:17 PM

## 2023-12-17 NOTE — Progress Notes (Signed)
 PROGRESS NOTE    Travis [name]  FMW:[id number] DOB: [birth year] DOA: 12/14/2023 PCP: Lenon Layman LELON, MD   Brief Narrative: 89 year old with past medical history significant for Parkinson disease, dementia, recurrent falls, bladder cancer status post TURBT, hypertension, neuropathy, IDA, BPH who was referred to the ED by [facility] after delayed report of right hip fracture.  Reportedly fell in June and had persistent pain in the right hip and difficulty ambulating.  Initial imaging per daughter 6/29 at that time of the fall was negative.  Had repeat imaging 7/23 noted to have right hip fracture.  Daughter had tried getting in touch with [facility] to evaluate on 12/14/2023 they got back to her and referred her her to the ED for evaluation.     Assessment & Plan:   Principal Problem:   Closed displaced fracture of right femoral neck (HCC) Active Problems:   CAP (community acquired pneumonia)   S/P total right hip arthroplasty  1-R impacted, displaced femoral neck/intertrochanteric fracture due to Recurrent Ground-level fall, POA: - Patient was found to have right hip displaced femoral neck fracture.  Underwent right hip hemiarthroplasty through anterior approach by Dr. Ernie 8/5 -Continue as scheduled Tylenol  - Baby aspirin  twice daily for DVT prophylaxis - added ibuprofen  as needed, on low-dose oxycodone  as needed - Home health recommended for patient.  Community acquired pneumonia  Dx via CT on 7/23, and started on treatment on the 30th with Augmentin   -- Patient completed Augmentin  8/6   Clinical diagnosis osteoporosis - Continue vitamin D  supplement  Incidental findings on outpatient imaging: - reports in media tab  Advanced coronary atherosclerosis  Small pericardial effusion  Aortic ectasia 4.4 cm  Severe central canal stenosis at L2-3  Multilevel neuroforaminal narrowing    Parkinsons with dementia: Not on medication due to AE.  As needed melatonin for insomnia  Bladder  CA s/p TURBT: outpatient uro f/u    HTN: Not on antihypertensives at time of admission    Neuropathy: Noted, not on medication    Iron deficiency anemia: Hemoglobin stable. Continue to monitor hemoglobin, acute blood loss postsurgery expected of no clinical significance Added B 12 supplement.   Low TSH; follow free T 3, T 4.  BPH: Continue home finasteride  and flomax        Estimated body mass index is 24.74 kg/m as calculated from the following:   Height as of this encounter: 5' 8 (1.727 m).   Weight as of this encounter: 73.8 kg.   DVT prophylaxis: On aspirin  Code Status: DNR with intervention Family Communication: Family is at bedside Disposition Plan:  Status is: Inpatient Remains inpatient appropriate because: Management post op. Home 8/08    Consultants:  Ortho  Procedures:  Underwent right hip hemiarthroplasty through anterior approach by Dr. Ernie 8/5  Antimicrobials:    Subjective: He is more alert today, report doing ok, denies worsening pain.   Objective: Vitals:   12/16/23 1325 12/16/23 2130 12/17/23 0457 12/17/23 1326  BP: 106/62 102/67 107/66 (!) 110/57  Pulse: 72 (!) 52 (!) 50 63  Resp: 16 18 19 18   Temp: 97.7 F (36.5 C) 97.7 F (36.5 C) 97.9 F (36.6 C) (!) 97.4 F (36.3 C)  TempSrc: Oral Oral Oral Oral  SpO2: 98% 96% 99% 97%  Weight:      Height:        Intake/Output Summary (Last 24 hours) at 12/17/2023 1340 Last data filed at 12/17/2023 1330 Gross per 24 hour  Intake 840 ml  Output 800 ml  Net 40 ml   Filed Weights   12/14/23 2100  Weight: 73.8 kg    Examination:  General exam: NAD Respiratory system: CTA Cardiovascular system: S 1, S 2 RRR Gastrointestinal system: Bs present, soft, nt Central nervous system: alert Extremities: right hip with dressing  Data Reviewed: I have personally reviewed following labs and imaging studies  CBC: Recent Labs  Lab 12/14/23 1517 12/15/23 0328 12/16/23 0343 12/17/23 0330  WBC  5.2 6.2 9.8 9.2  NEUTROABS 3.2  --   --   --   HGB 12.3* 11.9* 10.9* 9.5*  HCT 41.7 38.7* 36.2* 30.5*  MCV 79.6* 79.3* 80.8 79.4*  PLT 304 268 255 190   Basic Metabolic Panel: Recent Labs  Lab 12/14/23 1517 12/15/23 0328 12/16/23 0343  NA 138 136 138  K 3.9 3.7 4.6  CL 98 98 97*  CO2 30 27 28   GLUCOSE 103* 111* 123*  BUN 15 17 22   CREATININE 0.72 0.52* 0.77  CALCIUM 9.7 9.6 9.6  MG  --  1.7  --   PHOS  --  3.4  --    GFR: Estimated Creatinine Clearance: 59.4 mL/min (by C-G formula based on SCr of 0.77 mg/dL). Liver Function Tests: No results for input(s): AST, ALT, ALKPHOS, BILITOT, PROT, ALBUMIN  in the last 168 hours. No results for input(s): LIPASE, AMYLASE in the last 168 hours. No results for input(s): AMMONIA in the last 168 hours. Coagulation Profile: Recent Labs  Lab 12/15/23 0328  INR 1.1   Cardiac Enzymes: No results for input(s): CKTOTAL, CKMB, CKMBINDEX, TROPONINI in the last 168 hours. BNP (last 3 results) No results for input(s): PROBNP in the last 8760 hours. HbA1C: No results for input(s): HGBA1C in the last 72 hours. CBG: No results for input(s): GLUCAP in the last 168 hours. Lipid Profile: No results for input(s): CHOL, HDL, LDLCALC, TRIG, CHOLHDL, LDLDIRECT in the last 72 hours. Thyroid Function Tests: Recent Labs    12/17/23 0330  TSH 0.288*   Anemia Panel: Recent Labs    12/17/23 0330  VITAMINB12 392   Sepsis Labs: No results for input(s): PROCALCITON, LATICACIDVEN in the last 168 hours.  Recent Results (from the past 240 hours)  Resp panel by RT-PCR (RSV, Flu A&B, Covid) Anterior Nasal Swab     Status: None   Collection Time: 12/14/23  4:01 PM   Specimen: Anterior Nasal Swab  Result Value Ref Range Status   SARS Coronavirus 2 by RT PCR NEGATIVE NEGATIVE Final    Comment: (NOTE) SARS-CoV-2 target nucleic acids are NOT DETECTED.  The SARS-CoV-2 RNA is generally detectable in upper  respiratory specimens during the acute phase of infection. The lowest concentration of SARS-CoV-2 viral copies this assay can detect is 138 copies/mL. A negative result does not preclude SARS-Cov-2 infection and should not be used as the sole basis for treatment or other patient management decisions. A negative result may occur with  improper specimen collection/handling, submission of specimen other than nasopharyngeal swab, presence of viral mutation(s) within the areas targeted by this assay, and inadequate number of viral copies(<138 copies/mL). A negative result must be combined with clinical observations, patient history, and epidemiological information. The expected result is Negative.  Fact Sheet for Patients:  BloggerCourse.com  Fact Sheet for Healthcare Providers:  SeriousBroker.it  This test is no t yet approved or cleared by the [place]  FDA and  has been authorized for detection and/or diagnosis of SARS-CoV-2 by FDA under an Emergency Use Authorization (EUA). This EUA will  remain  in effect (meaning this test can be used) for the duration of the COVID-19 declaration under Section 564(b)(1) of the Act, 21 U.S.C.section 360bbb-3(b)(1), unless the authorization is terminated  or revoked sooner.       Influenza A by PCR NEGATIVE NEGATIVE Final   Influenza B by PCR NEGATIVE NEGATIVE Final    Comment: (NOTE) The Xpert Xpress SARS-CoV-2/FLU/RSV plus assay is intended as an aid in the diagnosis of influenza from Nasopharyngeal swab specimens and should not be used as a sole basis for treatment. Nasal washings and aspirates are unacceptable for Xpert Xpress SARS-CoV-2/FLU/RSV testing.  Fact Sheet for Patients: BloggerCourse.com  Fact Sheet for Healthcare Providers: SeriousBroker.it  This test is not yet approved or cleared by the [place]  FDA and has been  authorized for detection and/or diagnosis of SARS-CoV-2 by FDA under an Emergency Use Authorization (EUA). This EUA will remain in effect (meaning this test can be used) for the duration of the COVID-19 declaration under Section 564(b)(1) of the Act, 21 U.S.C. section 360bbb-3(b)(1), unless the authorization is terminated or revoked.     Resp Syncytial Virus by PCR NEGATIVE NEGATIVE Final    Comment: (NOTE) Fact Sheet for Patients: BloggerCourse.com  Fact Sheet for Healthcare Providers: SeriousBroker.it  This test is not yet approved or cleared by the [place]  FDA and has been authorized for detection and/or diagnosis of SARS-CoV-2 by FDA under an Emergency Use Authorization (EUA). This EUA will remain in effect (meaning this test can be used) for the duration of the COVID-19 declaration under Section 564(b)(1) of the Act, 21 U.S.C. section 360bbb-3(b)(1), unless the authorization is terminated or revoked.  Performed at [facility], [address], [place], [place] [address]   Surgical pcr screen     Status: None   Collection Time: 12/15/23  1:36 AM   Specimen: Nasal Mucosa; Nasal Swab  Result Value Ref Range Status   MRSA, PCR NEGATIVE NEGATIVE Final   Staphylococcus aureus NEGATIVE NEGATIVE Final    Comment: (NOTE) The Xpert SA Assay (FDA approved for NASAL specimens in patients 71 years of age and older), is one component of a comprehensive surveillance program. It is not intended to diagnose infection nor to guide or monitor treatment. Performed at [facility], [address] [place], [place] [address]          Radiology Studies: DG Pelvis Portable Result Date: 12/15/2023 CLINICAL DATA:  Status post hip arthroplasty. EXAM: PORTABLE PELVIS 1-2 VIEWS COMPARISON:  Preoperative imaging FINDINGS: Right hip hemiarthroplasty in expected alignment. No periprosthetic lucency or  fracture. Recent postsurgical change includes air and edema in the soft tissues. IMPRESSION: Right hip hemiarthroplasty without immediate postoperative complication. Electronically Signed   By: Andrea Gasman M.D.   On: 12/15/2023 17:47   DG HIP UNILAT WITH PELVIS 1V RIGHT Result Date: 12/15/2023 CLINICAL DATA:  Elective surgery. EXAM: DG HIP (WITH OR WITHOUT PELVIS) 1V RIGHT COMPARISON:  Preoperative imaging FINDINGS: Two fluoroscopic spot views of the pelvis and right hip obtained in the operating room. Images during hip arthroplasty. Fluoroscopy time 4.9 seconds. Dose 0.454 mGy. IMPRESSION: Intraoperative fluoroscopy during right hip arthroplasty. Electronically Signed   By: Andrea Gasman M.D.   On: 12/15/2023 17:47   DG C-Arm 1-60 Min-No Report Result Date: 12/15/2023 Fluoroscopy was utilized by the requesting physician.  No radiographic interpretation.   DG C-Arm 1-60 Min-No Report Result Date: 12/15/2023 Fluoroscopy was utilized by the requesting physician.  No radiographic interpretation.  Scheduled Meds:  acetaminophen   1,000 mg Oral Q6H   aspirin   81 mg Oral BID   cholecalciferol   1,000 Units Oral Daily   vitamin B-12  500 mcg Oral Daily   diclofenac  Sodium  2 g Topical QID   feeding supplement  237 mL Oral BID BM   finasteride   5 mg Oral Daily   folic acid   1 mg Oral Daily   pantoprazole   40 mg Oral Daily   polyethylene glycol  17 g Oral BID   senna  2 tablet Oral QHS   sodium chloride  flush  3 mL Intravenous Q12H   tamsulosin   0.4 mg Oral QPC supper   Continuous Infusions:  sodium chloride  75 mL/hr at 12/17/23 0200     LOS: 3 days    Time spent: 35 minutes    Dawnell Bryant A Raelynn Corron, MD [facility]   If 7PM-7AM, please contact night-coverage [email or web address]  12/17/2023, 1:40 PM

## 2023-12-17 NOTE — Progress Notes (Addendum)
   Subjective: 2 Days Post-Op Procedure(s) (LRB): RIGHT HIP HEMIARTHROPLASTY ANTERIOR APPROACH (Right) Patient reports pain as mild.   Patient seen in rounds for Dr. Ernie. Patient is resting in bed on exam this morning. No acute events overnight. Daughter at bedside this morning. We will start therapy today.   Objective: Vital signs in last 24 hours: Temp:  [97.7 F (36.5 C)-97.9 F (36.6 C)] 97.9 F (36.6 C) (08/07 0457) Pulse Rate:  [50-72] 50 (08/07 0457) Resp:  [16-19] 19 (08/07 0457) BP: (102-107)/(62-67) 107/66 (08/07 0457) SpO2:  [96 %-99 %] 99 % (08/07 0457)  Intake/Output from previous day:  Intake/Output Summary (Last 24 hours) at 12/17/2023 0719 Last data filed at 12/17/2023 0600 Gross per 24 hour  Intake 1499.86 ml  Output 300 ml  Net 1199.86 ml     Intake/Output this shift: No intake/output data recorded.  Labs: Recent Labs    12/14/23 1517 12/15/23 0328 12/16/23 0343 12/17/23 0330  HGB 12.3* 11.9* 10.9* 9.5*   Recent Labs    12/16/23 0343 12/17/23 0330  WBC 9.8 9.2  RBC 4.48 3.84*  HCT 36.2* 30.5*  PLT 255 190   Recent Labs    12/15/23 0328 12/16/23 0343  NA 136 138  K 3.7 4.6  CL 98 97*  CO2 27 28  BUN 17 22  CREATININE 0.52* 0.77  GLUCOSE 111* 123*  CALCIUM 9.6 9.6   Recent Labs    12/15/23 0328  INR 1.1    Exam: General - Patient is Alert and Oriented Extremity - Neurologically intact Sensation intact distally Dressing - dressing C/D/I Motor Function - intact, moving foot and toes well on exam.   Past Medical History:  Diagnosis Date   Arthritis    Cancer (HCC)    BLADDER   Complication of anesthesia    had a hard time recovering from anesthesia after knee replacement neuro symptoms   Dementia (HCC)    Hyperlipidemia    Parkinson disease (HCC)     Assessment/Plan: 2 Days Post-Op Procedure(s) (LRB): RIGHT HIP HEMIARTHROPLASTY ANTERIOR APPROACH (Right) Principal Problem:   Closed displaced fracture of right  femoral neck (HCC) Active Problems:   CAP (community acquired pneumonia)   S/P total right hip arthroplasty  Estimated body mass index is 24.74 kg/m as calculated from the following:   Height as of this encounter: 5' 8 (1.727 m).   Weight as of this encounter: 73.8 kg. Advance diet Up with therapy  DVT Prophylaxis - Aspirin  Weight bearing as tolerated.  Hgb stable at 9.5 this AM  Up with PT today for disposition planning Okay for d/c from ortho standpoint whenever meeting goals with PT  Rosina Calin, PA-C Orthopedic Surgery [phone] 12/17/2023, 7:19 AM

## 2023-12-17 NOTE — Progress Notes (Signed)
 Physical Therapy Treatment Patient Details Name: Travis [name] MRN: [id number] DOB: Jun 01, 1931 Today's Date: 12/17/2023   History of Present Illness Pt is 89 yo male admitted 12/14/23 with impacted, displaced R femoral neck fx.  He is s/p anterior R hemiarthroplasty on 12/15/23.  Pt initial fall in June but imaging negative.  He had repeat imaging on 12/02/23 that revealed R hip fx, daughter tried to contact [facility] and ultimately referred to ED on 8/4. Pt with hx including but not limited to Parkinsons, dementia, falls, bladder CA s/p TURBT, HTN, neuropathy, IDA, BPH.    PT Comments  Patient assisted to stand  at [facility] with 3 persons to facilitate upright posture to place flaps under buttocks and to stand again once transferred to bed.  Patient with minimal verbalizations, indicates right hip discomfort when questioned. Continue PT/mobility.    If plan is discharge home, recommend the following: Two people to help with walking and/or transfers;Two people to help with bathing/dressing/bathroom;A lot of help with bathing/dressing/bathroom   Can travel by private vehicle     No  Equipment Recommendations  None recommended by PT    Recommendations for Other Services       Precautions / Restrictions Precautions Precautions: Fall Restrictions Weight Bearing Restrictions Per Provider Order: No RLE Weight Bearing Per Provider Order: Weight bearing as tolerated     Mobility  Bed Mobility Overal bed mobility: Needs Assistance Bed Mobility: Sit to Supine     SSit to supine: Total assist, +2 for physical assistance, +2 for safety/equipment   General bed mobility comments: assist legas and trunk to return to supine    Transfers Overall transfer level: Needs assistance Equipment used: Ambulation equipment used Transfers: Sit to/from Stand, Bed to chair/wheelchair/BSC Sit to Stand: +2 physical assistance, Max assist, +2 safety/equipment, From elevated surface, Via lift equipment  (+3  assist)           General transfer comment: STS into STEDY with total A from lower recliner surface, +3 to facilitate standing more erect to get STEDY flaps in place. transfer to bed  with lift , then +3 to stand erect again to remove flaps, patient does sit on flaps  and hold stedy bar with trunk upright. Transfer via Lift Equipment: Stedy  Ambulation/Gait                   Stairs             Wheelchair Mobility     Tilt Bed    Modified Rankin (Stroke Patients Only)       Balance Overall balance assessment: Needs assistance, History of Falls Sitting-balance support: Bilateral upper extremity supported Sitting balance-Leahy Scale: Poor Sitting balance - Comments: requiring UE support   Standing balance support: Bilateral upper extremity supported Standing balance-Leahy Scale: Poor Standing balance comment: mod once up to stand, holding onto STEDY bar                            Communication Communication Communication: Impaired Factors Affecting Communication: Reduced clarity of speech  Cognition Arousal: Alert Behavior During Therapy: Flat affect   PT - Cognitive impairments: History of cognitive impairments, Difficult to assess Difficult to assess due to: Impaired communication                     PT - Cognition Comments: aroused  with stimulation, difficult to understand speech Following commands: Impaired Following commands impaired:  Follows one step commands inconsistently    Cueing Cueing Techniques: Verbal cues, Gestural cues, Tactile cues, Visual cues  Exercises    General Comments        Pertinent Vitals/Pain Pain Assessment Faces Pain Scale: Hurts little more Pain Location: right knee and  hip Pain Descriptors / Indicators: Discomfort, Grimacing Pain Intervention(s): Repositioned    Home Living                          Prior Function            PT Goals (current goals can now be found in  the care plan section) Progress towards PT goals: Progressing toward goals    Frequency    Min 3X/week      PT Plan      Co-evaluation              AM-PAC PT 6 Clicks Mobility   Outcome Measure  Help needed turning from your back to your side while in a flat bed without using bedrails?: Total Help needed moving from lying on your back to sitting on the side of a flat bed without using bedrails?: Total Help needed moving to and from a bed to a chair (including a wheelchair)?: Total Help needed standing up from a chair using your arms (e.g., wheelchair or bedside chair)?: Total Help needed to walk in hospital room?: Total Help needed climbing 3-5 steps with a railing? : Total 6 Click Score: 6    End of Session Equipment Utilized During Treatment: Gait belt Activity Tolerance: Patient tolerated treatment well Patient left: in bed;with call bell/phone within reach;with family/visitor present;with nursing/sitter in room Nurse Communication: Mobility status;Need for lift equipment PT Visit Diagnosis: Other abnormalities of gait and mobility (R26.89);Muscle weakness (generalized) (M62.81)     Time: 8669-8654 PT Time Calculation (min) (ACUTE ONLY): 15 min  Charges:    $Therapeutic Activity: 8-22 mins PT General Charges $$ ACUTE PT VISIT: 1 Visit                     Darice Potters PT Acute Rehabilitation Services Office [phone]    Potters Darice Norris 12/17/2023, 2:28 PM

## 2023-12-18 DIAGNOSIS — S72001A Fracture of unspecified part of neck of right femur, initial encounter for closed fracture: Secondary | ICD-10-CM | POA: Diagnosis not present

## 2023-12-18 LAB — T3, FREE: T3, Free: 1.1 pg/mL — ABNORMAL LOW (ref 2.0–4.4)

## 2023-12-18 MED ORDER — SENNA 8.6 MG PO TABS: 2.0000 | ORAL_TABLET | Freq: Every day | ORAL | 0 refills | Status: AC

## 2023-12-18 MED ORDER — ASPIRIN 81 MG PO CHEW: 81.0000 mg | CHEWABLE_TABLET | Freq: Two times a day (BID) | ORAL | 0 refills | Status: AC

## 2023-12-18 MED ORDER — FOLIC ACID 1 MG PO TABS: 1.0000 mg | ORAL_TABLET | Freq: Every day | ORAL | 0 refills | Status: AC

## 2023-12-18 MED ORDER — BISACODYL 10 MG RE SUPP: 10.0000 mg | Freq: Every day | RECTAL | 0 refills | Status: AC | PRN

## 2023-12-18 MED ORDER — POLYETHYLENE GLYCOL 3350 17 G PO PACK: 17.0000 g | PACK | Freq: Two times a day (BID) | ORAL | 0 refills | Status: AC

## 2023-12-18 MED ORDER — BISACODYL 5 MG PO TBEC
5.0000 mg | DELAYED_RELEASE_TABLET | Freq: Once | ORAL | Status: AC
  Administered 2023-12-18: 5 mg via ORAL
  Filled 2023-12-18: qty 1

## 2023-12-18 NOTE — Progress Notes (Signed)
 Physical Therapy Treatment Patient Details Name: Travis [name] MRN: [id number] DOB: 12-29-1931 Today's Date: 12/18/2023   History of Present Illness Pt is 89 yo male admitted 12/14/23 with impacted, displaced R femoral neck fx.  He is s/p anterior R hemiarthroplasty on 12/15/23.  Pt initial fall in June but imaging negative.  He had repeat imaging on 12/02/23 that revealed R hip fx, daughter tried to contact [facility] and ultimately referred to ED on 8/4. Pt with hx including but not limited to Parkinsons, dementia, falls, bladder CA s/p TURBT, HTN, neuropathy, IDA, BPH.    PT Comments  Pt more alert and with good participation.  Continues to need heavy assist of 2 and was not able to take any steps.  Needs increased time and cues.  Continue POC.  Family plans to return home with 24 hr support, HH therapies, lift equipment, and PTAR transport home.      If plan is discharge home, recommend the following: Two people to help with walking and/or transfers;Two people to help with bathing/dressing/bathroom;A lot of help with bathing/dressing/bathroom   Can travel by private vehicle     No  Equipment Recommendations  None recommended by PT    Recommendations for Other Services       Precautions / Restrictions Precautions Precautions: Fall Restrictions RLE Weight Bearing Per Provider Order: Weight bearing as tolerated     Mobility  Bed Mobility Overal bed mobility: Needs Assistance Bed Mobility: Supine to Sit, Sit to Supine     Supine to sit: Max assist, +2 for physical assistance Sit to supine: Max assist, +2 for physical assistance   General bed mobility comments: increased cues and time    Transfers Overall transfer level: Needs assistance Equipment used: Rolling walker (2 wheels) Transfers: Sit to/from Stand Sit to Stand: Max assist, Mod assist, +2 physical assistance, From elevated surface           General transfer comment: STS x 3 progressing from max x 2 to mod x 2.   Prior to standing worked on leaning forward to improve posterior bias (see balance) and assisted in positioning feet. Cues for leaning forward and use of momentum then cues to tuck bottom and stand upright. Did not transfer to chair as pt not able to take steps and plan is to d/c with PTAR transport so returned to bed    Ambulation/Gait               General Gait Details: not able, attempted assist with weight shifting but pt fatiguing and returning to sit   Stairs             Wheelchair Mobility     Tilt Bed    Modified Rankin (Stroke Patients Only)       Balance Overall balance assessment: Needs assistance, History of Falls Sitting-balance support: No upper extremity supported, Bilateral upper extremity supported Sitting balance-Leahy Scale: Poor Sitting balance - Comments: Initial posterior bias requiring UE support and mod A.  Cues/assist to reach forward , sat forward 2 mins then able to progress back to hands on knees and close supervision   Standing balance support: Bilateral upper extremity supported Standing balance-Leahy Scale: Poor Standing balance comment: Stood x 3 with RW and mod A x 2, difficulty tucking bottom and straightening knees.  Daughter reports does better wtih arms and RW forward - attempted this on 2nd and 3rd attempts with slight improvement ; however, still fatigued easily , posterior bias, and unable to weight  shift.  Only maintains standing 5-10 seconds                            Communication Communication Factors Affecting Communication: Reduced clarity of speech  Cognition Arousal: Alert Behavior During Therapy: WFL for tasks assessed/performed   PT - Cognitive impairments: History of cognitive impairments, Sequencing, Problem solving, Memory, Awareness                       PT - Cognition Comments: more alert today, needs increased time and cues   Following commands impaired: Follows one step commands with  increased time    Cueing    Exercises General Exercises - Lower Extremity Ankle Circles/Pumps: AROM, Both, 10 reps, Supine Quad Sets: Strengthening, Both, 10 reps, Supine (max cues) Long Arc Quad: AAROM, Left, 10 reps, Right, 5 reps, Limitations Long Arc Quad Limitations: Pt initially not able to perform on either side.  Started with L AAROM, then had pt hold in ext, then able to do AROM.  Required AAROM on R. Heel Slides: AAROM, Both, 10 reps, Supine Hip ABduction/ADduction: AAROM, Both, 10 reps, Supine Other Exercises Other Exercises: Provided family with education on HEP and provided with exercise handout for hip replacement with supine and sitting exercises    General Comments        Pertinent Vitals/Pain Pain Assessment Pain Assessment: Faces Faces Pain Scale: Hurts a little bit Pain Location: right knee and  hip Pain Descriptors / Indicators: Discomfort, Grimacing Pain Intervention(s): Limited activity within patient's tolerance, Monitored during session, Repositioned, Ice applied (family/pt avoiding pain meds b/c of sedating effect)    Home Living                          Prior Function            PT Goals (current goals can now be found in the care plan section) Progress towards PT goals: Progressing toward goals    Frequency    Min 3X/week      PT Plan      Co-evaluation              AM-PAC PT 6 Clicks Mobility   Outcome Measure  Help needed turning from your back to your side while in a flat bed without using bedrails?: Total Help needed moving from lying on your back to sitting on the side of a flat bed without using bedrails?: Total Help needed moving to and from a bed to a chair (including a wheelchair)?: Total Help needed standing up from a chair using your arms (e.g., wheelchair or bedside chair)?: Total Help needed to walk in hospital room?: Total Help needed climbing 3-5 steps with a railing? : Total 6 Click Score: 6     End of Session Equipment Utilized During Treatment: Gait belt Activity Tolerance: Patient tolerated treatment well Patient left: in bed;with call bell/phone within reach;with family/visitor present;with bed alarm set;with SCD's reapplied (Prevalon boot in place) Nurse Communication: Mobility status;Need for lift equipment PT Visit Diagnosis: Other abnormalities of gait and mobility (R26.89);Muscle weakness (generalized) (M62.81)     Time: 1129-1209 PT Time Calculation (min) (ACUTE ONLY): 40 min  Charges:    $Therapeutic Exercise: 8-22 mins $Therapeutic Activity: 8-22 mins $Neuromuscular Re-education: 8-22 mins PT General Charges $$ ACUTE PT VISIT: 1 Visit  Marcus Schwandt, PT Acute Rehab [facility] Rehab [phone]    Benjiman VEAR [place] 12/18/2023, 1:27 PM

## 2023-12-18 NOTE — Discharge Summary (Signed)
 Physician Discharge Summary   Patient: Travis [name] MRN: [id number] DOB: 21-Nov-1931  Admit date:     12/14/2023  Discharge date: 12/18/23  Discharge Physician: Owen DELENA Lore   PCP: Lenon Layman LELON, MD   Recommendations at discharge:   Follow up with Orthopedic post Sx in 1-2 weeks.  Monitor CBC Needs repeat TSH, free T 3 and T 4 in 4 weeks.   Discharge Diagnoses: Principal Problem:   Closed displaced fracture of right femoral neck (HCC) Active Problems:   CAP (community acquired pneumonia)   S/P total right hip arthroplasty  Resolved Problems:   * No resolved hospital problems. *  Hospital Course: 89 year old with past medical history significant for Parkinson disease, dementia, recurrent falls, bladder cancer status post TURBT, hypertension, neuropathy, IDA, BPH who was referred to the ED by [facility] after delayed report of right hip fracture.  Reportedly fell in June and had persistent pain in the right hip and difficulty ambulating.  Initial imaging per daughter 6/29 at that time of the fall was negative.  Had repeat imaging 7/23 noted to have right hip fracture.  Daughter had tried getting in touch with [facility] to evaluate on 12/14/2023 they got back to her and referred her her to the ED for evaluation.    Assessment and Plan: 1-R impacted, displaced femoral neck/intertrochanteric fracture due to Recurrent Ground-level fall, POA: - Patient was found to have right hip displaced femoral neck fracture.  Underwent right hip hemiarthroplasty through anterior approach by Dr. Ernie 8/5 -Continue as scheduled Tylenol  - Baby aspirin  twice daily for DVT prophylaxis - added ibuprofen  as needed, on low-dose oxycodone  as needed - Home health recommended for patient. Stable for discharge.   Community acquired pneumonia  Dx via CT on 7/23, and started on treatment on the 30th with Augmentin   -- Patient completed Augmentin  8/6   Clinical diagnosis osteoporosis - Continue vitamin D   supplement   Incidental findings on outpatient imaging: - reports in media tab  Advanced coronary atherosclerosis  Small pericardial effusion  Aortic ectasia 4.4 cm  Severe central canal stenosis at L2-3  Multilevel neuroforaminal narrowing    Parkinsons with dementia: Not on medication due to AE.  As needed melatonin for insomnia   Bladder CA s/p TURBT: outpatient uro f/u    HTN: Not on antihypertensives at time of admission    Neuropathy: Noted, not on medication    Iron deficiency anemia: Hemoglobin stable. Continue to monitor hemoglobin, acute blood loss postsurgery expected of no clinical significance Added B 12 supplement.    Low TSH; follow free T 3, T 4. Free T 3 low, free T4 mildly elevated. He will needs repeat labs in 4 weeks.  BPH: Continue home finasteride  and flomax              Consultants: Orthopedic.  Procedures performed: Hip sx Disposition: Home Diet recommendation:  Discharge Diet Orders (From admission, onward)     Start     Ordered   12/18/23 0000  Diet - low sodium heart healthy        12/18/23 1028           Cardiac diet DISCHARGE MEDICATION: Allergies as of 12/18/2023       Reactions   Sinemet  [carbidopa -levodopa ] Other (See Comments)   -Ataxia -Hallucinations   Ditropan  [oxybutynin ] Other (See Comments)   -Hallucinations   Enablex  [darifenacin ] Other (See Comments)   -Hallucinations   Gabapentin  Other (See Comments)   -Hallucinations   Myrbetriq  [mirabegron ] Other (See  Comments)   -Hallucinations   Propofol  Other (See Comments)   -Hallucinations   Celebrex [celecoxib] Other (See Comments)   -excessive salivation        Medication List     STOP taking these medications    amoxicillin -clavulanate 875-125 MG tablet Commonly known as: AUGMENTIN    azithromycin 250 MG tablet Commonly known as: ZITHROMAX       TAKE these medications    acetaminophen  325 MG tablet Commonly known as: TYLENOL  Take 650 mg by mouth  every 8 (eight) hours as needed for moderate pain (pain score 4-6). What changed: Another medication with the same name was removed. Continue taking this medication, and follow the directions you see here.   ascorbic acid  500 MG tablet Commonly known as: VITAMIN C  Take 1 tablet (500 mg total) by mouth daily.   aspirin  81 MG chewable tablet Chew 1 tablet (81 mg total) by mouth 2 (two) times daily.   bisacodyl  10 MG suppository Commonly known as: DULCOLAX Place 1 suppository (10 mg total) rectally daily as needed for moderate constipation.   camphor-menthol  lotion Commonly known as: SARNA Apply 1 Application topically 2 (two) times daily as needed (arthritis).   carboxymethylcellulose 1 % ophthalmic solution Apply 1 drop to eye 4 (four) times daily as needed (dry eye).   Carboxymethylcellulose Sod PF 1 % Gel Apply 1 application  to eye in the morning and at bedtime.   cetirizine 10 MG tablet Commonly known as: ZYRTEC Take 10 mg by mouth daily.   cyanocobalamin  1000 MCG tablet Commonly known as: VITAMIN B12 Take 1,000 mcg by mouth daily.   diclofenac  Sodium 1 % Gel Commonly known as: VOLTAREN  Apply 2 g topically 4 (four) times daily.   feeding supplement Liqd Take 237 mLs by mouth 2 (two) times daily between meals.   finasteride  5 MG tablet Commonly known as: PROSCAR  Take 1 tablet (5 mg total) by mouth daily.   folic acid  1 MG tablet Commonly known as: FOLVITE  Take 1 tablet (1 mg total) by mouth daily. Start taking on: December 19, 2023   furosemide 20 MG tablet Commonly known as: LASIX Take 20 mg by mouth daily as needed for edema.   Gemtesa  75 MG Tabs Generic drug: Vibegron  Take 1 tablet (75 mg total) by mouth daily.   ketotifen 0.035 % ophthalmic solution Commonly known as: ZADITOR Place 1 drop into both eyes daily as needed (allergies).   pantoprazole  40 MG tablet Commonly known as: PROTONIX  Take 40 mg by mouth daily.   polyethylene glycol 17 g  packet Commonly known as: MIRALAX  / GLYCOLAX  Take 17 g by mouth 2 (two) times daily.   senna 8.6 MG Tabs tablet Commonly known as: SENOKOT Take 2 tablets (17.2 mg total) by mouth at bedtime.   tamsulosin  0.4 MG Caps capsule Commonly known as: FLOMAX  Take 1 capsule (0.4 mg total) by mouth daily after supper.   VITAMIN D  PO Take 1 tablet by mouth daily.   Zinc  Oxide 12 % Crea Apply 1 application  topically daily.        Follow-up Information     Ernie Cough, MD. Schedule an appointment as soon as possible for a visit in 2 week(s).   Specialty: Orthopedic Surgery Contact information: [address] [place] [address] [phone]         [facility]  Follow up.   Why: Home Health Physical and Occupational Therapy Contact information: [address] [place] [address]  [phone]                Discharge Exam: Fredricka Weights   12/14/23 2100  Weight: 73.8 kg   General; NAD  Condition at discharge: stable  The results of significant diagnostics from this hospitalization (including imaging, microbiology, ancillary and laboratory) are listed below for reference.   Imaging Studies: DG Pelvis Portable Result Date: 12/15/2023 CLINICAL DATA:  Status post hip arthroplasty. EXAM: PORTABLE PELVIS 1-2 VIEWS COMPARISON:  Preoperative imaging FINDINGS: Right hip hemiarthroplasty in expected alignment. No periprosthetic lucency or fracture. Recent postsurgical change includes air and edema in the soft tissues. IMPRESSION: Right hip hemiarthroplasty without immediate postoperative complication. Electronically Signed   By: Andrea Gasman M.D.   On: 12/15/2023 17:47   DG HIP UNILAT WITH PELVIS 1V RIGHT Result Date: 12/15/2023 CLINICAL DATA:  Elective surgery. EXAM: DG HIP (WITH OR WITHOUT PELVIS) 1V RIGHT COMPARISON:  Preoperative imaging FINDINGS: Two fluoroscopic spot views of the pelvis and right hip obtained in the  operating room. Images during hip arthroplasty. Fluoroscopy time 4.9 seconds. Dose 0.454 mGy. IMPRESSION: Intraoperative fluoroscopy during right hip arthroplasty. Electronically Signed   By: Andrea Gasman M.D.   On: 12/15/2023 17:47   DG C-Arm 1-60 Min-No Report Result Date: 12/15/2023 Fluoroscopy was utilized by the requesting physician.  No radiographic interpretation.   DG C-Arm 1-60 Min-No Report Result Date: 12/15/2023 Fluoroscopy was utilized by the requesting physician.  No radiographic interpretation.   DG Chest Portable 1 View Result Date: 12/14/2023 CLINICAL DATA:  R femoral neck fx on CT scan from 12/02/23; RLL PNA on CT chest noncon EXAM: PORTABLE CHEST - 1 VIEW COMPARISON:  August 25, 2023, August 23, 2023 FINDINGS: Low lung volumes. Skin fold artifact along the left lateral chest. No focal airspace consolidation, pleural effusion, or pneumothorax. Mild cardiomegaly. No acute fracture or destructive lesion. IMPRESSION: Low lung volumes.  Otherwise, no acute cardiopulmonary abnormality. Electronically Signed   By: Rogelia Myers M.D.   On: 12/14/2023 16:56   DG Hip Unilat W or Wo Pelvis 2-3 Views Right Result Date: 12/14/2023 CLINICAL DATA:  R femoral neck fx on CT scan from 12/02/23; RLL PNA on CT chest noncon EXAM: DG HIP (WITH OR WITHOUT PELVIS) 2-3V RIGHT COMPARISON:  None Available. FINDINGS: No evidence of pelvic fracture or diastasis.Superiorly displaced, impacted fracture of the intertrochanteric right femoral neck.There is no evidence of arthropathy or other focal bone abnormality.Soft tissues are unremarkable. IMPRESSION: Superiorly displaced, impacted fracture of the right intertrochanteric femoral neck. Electronically Signed   By: Rogelia Myers M.D.   On: 12/14/2023 16:53    Microbiology: Results for orders placed or performed during the hospital encounter of 12/14/23  Resp panel by RT-PCR (RSV, Flu A&B, Covid) Anterior Nasal Swab     Status: None   Collection Time: 12/14/23   4:01 PM   Specimen: Anterior Nasal Swab  Result Value Ref Range Status   SARS Coronavirus 2 by RT PCR NEGATIVE NEGATIVE Final    Comment: (NOTE) SARS-CoV-2 target nucleic acids are NOT DETECTED.  The SARS-CoV-2 RNA is generally detectable in upper respiratory specimens during the acute phase of infection. The lowest concentration of SARS-CoV-2 viral copies this assay can detect is 138 copies/mL. A negative result does not preclude SARS-Cov-2 infection and should not be used as the sole basis for treatment or other patient management decisions. A negative result may occur with  improper specimen collection/handling, submission of specimen other than nasopharyngeal swab, presence of viral mutation(s) within the  areas targeted by this assay, and inadequate number of viral copies(<138 copies/mL). A negative result must be combined with clinical observations, patient history, and epidemiological information. The expected result is Negative.  Fact Sheet for Patients:  BloggerCourse.com  Fact Sheet for Healthcare Providers:  SeriousBroker.it  This test is no t yet approved or cleared by the [place]  FDA and  has been authorized for detection and/or diagnosis of SARS-CoV-2 by FDA under an Emergency Use Authorization (EUA). This EUA will remain  in effect (meaning this test can be used) for the duration of the COVID-19 declaration under Section 564(b)(1) of the Act, 21 U.S.C.section 360bbb-3(b)(1), unless the authorization is terminated  or revoked sooner.       Influenza A by PCR NEGATIVE NEGATIVE Final   Influenza B by PCR NEGATIVE NEGATIVE Final    Comment: (NOTE) The Xpert Xpress SARS-CoV-2/FLU/RSV plus assay is intended as an aid in the diagnosis of influenza from Nasopharyngeal swab specimens and should not be used as a sole basis for treatment. Nasal washings and aspirates are unacceptable for Xpert Xpress  SARS-CoV-2/FLU/RSV testing.  Fact Sheet for Patients: BloggerCourse.com  Fact Sheet for Healthcare Providers: SeriousBroker.it  This test is not yet approved or cleared by the [place]  FDA and has been authorized for detection and/or diagnosis of SARS-CoV-2 by FDA under an Emergency Use Authorization (EUA). This EUA will remain in effect (meaning this test can be used) for the duration of the COVID-19 declaration under Section 564(b)(1) of the Act, 21 U.S.C. section 360bbb-3(b)(1), unless the authorization is terminated or revoked.     Resp Syncytial Virus by PCR NEGATIVE NEGATIVE Final    Comment: (NOTE) Fact Sheet for Patients: BloggerCourse.com  Fact Sheet for Healthcare Providers: SeriousBroker.it  This test is not yet approved or cleared by the [place]  FDA and has been authorized for detection and/or diagnosis of SARS-CoV-2 by FDA under an Emergency Use Authorization (EUA). This EUA will remain in effect (meaning this test can be used) for the duration of the COVID-19 declaration under Section 564(b)(1) of the Act, 21 U.S.C. section 360bbb-3(b)(1), unless the authorization is terminated or revoked.  Performed at [facility], [address], [place], [place] [address]   Surgical pcr screen     Status: None   Collection Time: 12/15/23  1:36 AM   Specimen: Nasal Mucosa; Nasal Swab  Result Value Ref Range Status   MRSA, PCR NEGATIVE NEGATIVE Final   Staphylococcus aureus NEGATIVE NEGATIVE Final    Comment: (NOTE) The Xpert SA Assay (FDA approved for NASAL specimens in patients 32 years of age and older), is one component of a comprehensive surveillance program. It is not intended to diagnose infection nor to guide or monitor treatment. Performed at [facility], [address], [place], [place] [address]      Labs: CBC: Recent Labs  Lab 12/14/23 1517 12/15/23 0328 12/16/23 0343 12/17/23 0330  WBC 5.2 6.2 9.8 9.2  NEUTROABS 3.2  --   --   --   HGB 12.3* 11.9* 10.9* 9.5*  HCT 41.7 38.7* 36.2* 30.5*  MCV 79.6* 79.3* 80.8 79.4*  PLT 304 268 255 190   Basic Metabolic Panel: Recent Labs  Lab 12/14/23 1517 12/15/23 0328 12/16/23 0343  NA 138 136 138  K 3.9 3.7 4.6  CL 98 98 97*  CO2 30 27 28   GLUCOSE 103* 111* 123*  BUN 15 17 22   CREATININE 0.72 0.52* 0.77  CALCIUM 9.7 9.6 9.6  MG  --  1.7  --   PHOS  --  3.4  --    Liver Function Tests: No results for input(s): AST, ALT, ALKPHOS, BILITOT, PROT, ALBUMIN  in the last 168 hours. CBG: No results for input(s): GLUCAP in the last 168 hours.  Discharge time spent: greater than 30 minutes.  Signed: Owen DELENA Lore, MD [facility] 12/18/2023

## 2023-12-18 NOTE — TOC Transition Note (Addendum)
 Transition of Care [facility]) - Discharge Note   Patient Details  Name: Travis [name] MRN: [id number] Date of Birth: [birth year]  [facility] CM/SW Contact:  Alfonse JONELLE Rex, RN Phone Number: 12/18/2023, 12:29 PM   Clinical Narrative:   DC Home to: [address], [place], [place] [address], [facility] PT/OT arranged with [facility], added to AVS. Patient's dtr at bedside. [facility]AR for transport. No further TOC needs identified at this time.   LATE ENTRY: -2:29pm Call received from [facility] w[facility], unable to locate consult for [facility]. Deloras states she will follow up with patient's [facility] PCP-Nurse, provided name and phone number of patient;s dtr, [name], for follow up.     Final next level of care: Home w Home Health Services Barriers to Discharge: Barriers Resolved   Patient Goals and CMS Choice Patient states their goals for this hospitalization and ongoing recovery are:: return home CMS Medicare.gov Compare Post Acute Care list provided to:: Patient Represenative (must comment) Marylou Gal (Daughter)  [phone] (Mobile)) Choice offered to / list presented to : Adult Children [place] ownership interest in [facility].provided to:: Adult Children    Discharge Placement                       Discharge Plan and Services Additional resources added to the After Visit Summary for                            [facility] Arranged: PT, OT HH Agency: [facility] ([facility]) Date HH Agency Contacted: 12/18/23 Time HH Agency Contacted: 1229 Representative spoke with at [facility] Agency: [facility]  Social Drivers of Health (SDOH) Interventions SDOH Screenings   Food Insecurity: No Food Insecurity (12/15/2023)  Housing: Low Risk  (12/15/2023)  Transportation Needs: No Transportation Needs (12/15/2023)  Utilities: Not At Risk (12/15/2023)  Financial Resource Strain: Low Risk  (03/13/2023)   Received from [facility]  Social Connections: Socially  Isolated (08/23/2023)  Tobacco Use: Low Risk  (12/15/2023)     Readmission Risk Interventions    12/15/2023   10:07 AM  Readmission Risk Prevention Plan  Transportation Screening Complete  PCP or Specialist Appt within 5-7 Days Complete  Home Care Screening Complete  Medication Review (RN CM) Complete

## 2024-02-11 ENCOUNTER — Other Ambulatory Visit: Payer: Self-pay

## 2024-02-11 DIAGNOSIS — R3912 Poor urinary stream: Secondary | ICD-10-CM

## 2024-02-15 MED ORDER — TAMSULOSIN HCL 0.4 MG PO CAPS: 0.4000 mg | ORAL_CAPSULE | Freq: Every day | ORAL | 3 refills | Status: AC

## 2024-04-11 ENCOUNTER — Other Ambulatory Visit: Admitting: Urology

## 2024-05-16 ENCOUNTER — Other Ambulatory Visit: Payer: Self-pay | Admitting: Urology

## 2024-05-16 DIAGNOSIS — R350 Frequency of micturition: Secondary | ICD-10-CM

## 2024-05-16 NOTE — Telephone Encounter (Signed)
 Patient has not been seen in a year. Pt is scheduled in March.

## 2024-05-16 NOTE — Telephone Encounter (Signed)
 The patient called to request a medication refill of Gemtesa . Patient would like the medication sent to [facility] pharmacy.

## 2024-06-09 ENCOUNTER — Telehealth: Payer: Self-pay | Admitting: Urology

## 2024-06-09 NOTE — Telephone Encounter (Signed)
 Patient is having a discharge from his penis and would like to see what she needs to do, clear mucous discharge

## 2024-06-10 NOTE — Telephone Encounter (Signed)
 Tried calling pt/ daughter several time with drop call. LVM

## 2024-07-25 ENCOUNTER — Other Ambulatory Visit: Admitting: Urology
